# Patient Record
Sex: Male | Born: 1950 | Race: White | Hispanic: No | Smoking: Former smoker
Health system: Southern US, Community
[De-identification: ages and names within clinical notes are randomized; demographics above are authoritative.]

## PROBLEM LIST (undated history)

## (undated) DIAGNOSIS — J449 Chronic obstructive pulmonary disease, unspecified: Secondary | ICD-10-CM

## (undated) DIAGNOSIS — I10 Essential (primary) hypertension: Secondary | ICD-10-CM

---

## 1976-05-04 DIAGNOSIS — F4310 Post-traumatic stress disorder, unspecified: Secondary | ICD-10-CM

## 1976-05-04 HISTORY — DX: Post-traumatic stress disorder, unspecified: F43.10

## 2008-05-01 ENCOUNTER — Emergency Department (HOSPITAL_COMMUNITY): Admission: EM | Admit: 2008-05-01 | Discharge: 2008-05-01 | Payer: Self-pay | Admitting: Family Medicine

## 2010-06-25 ENCOUNTER — Other Ambulatory Visit (HOSPITAL_COMMUNITY): Payer: Self-pay | Admitting: Physical Medicine and Rehabilitation

## 2010-06-25 ENCOUNTER — Ambulatory Visit (HOSPITAL_COMMUNITY)
Admission: RE | Admit: 2010-06-25 | Discharge: 2010-06-25 | Disposition: A | Discharge status: Home / Self Care | Payer: Self-pay | Source: Ambulatory Visit | Attending: Physical Medicine and Rehabilitation | Admitting: Physical Medicine and Rehabilitation

## 2010-06-25 DIAGNOSIS — M545 Low back pain, unspecified: Secondary | ICD-10-CM | POA: Insufficient documentation

## 2010-06-25 DIAGNOSIS — W19XXXA Unspecified fall, initial encounter: Secondary | ICD-10-CM

## 2011-02-06 LAB — POCT I-STAT, CHEM 8
BUN: 9 mg/dL (ref 6–23)
Creatinine, Ser: 1.1 mg/dL (ref 0.4–1.5)
Hemoglobin: 15 g/dL (ref 13.0–17.0)
Potassium: 4.1 mEq/L (ref 3.5–5.1)
Sodium: 145 mEq/L (ref 135–145)

## 2011-02-06 LAB — POCT CARDIAC MARKERS
CKMB, poc: 1.6 ng/mL (ref 1.0–8.0)
Myoglobin, poc: 69.3 ng/mL (ref 12–200)

## 2012-07-10 IMAGING — CR DG LUMBAR SPINE COMPLETE 4+V
5 series · 5 of 5 positions shown · non-contrast
Comparison: None.

CLINICAL DATA: History of injury from previous fall with current
low back pain.

LUMBAR SPINE - COMPLETE 4+ VIEW

[view not recorded (1 of 5)]
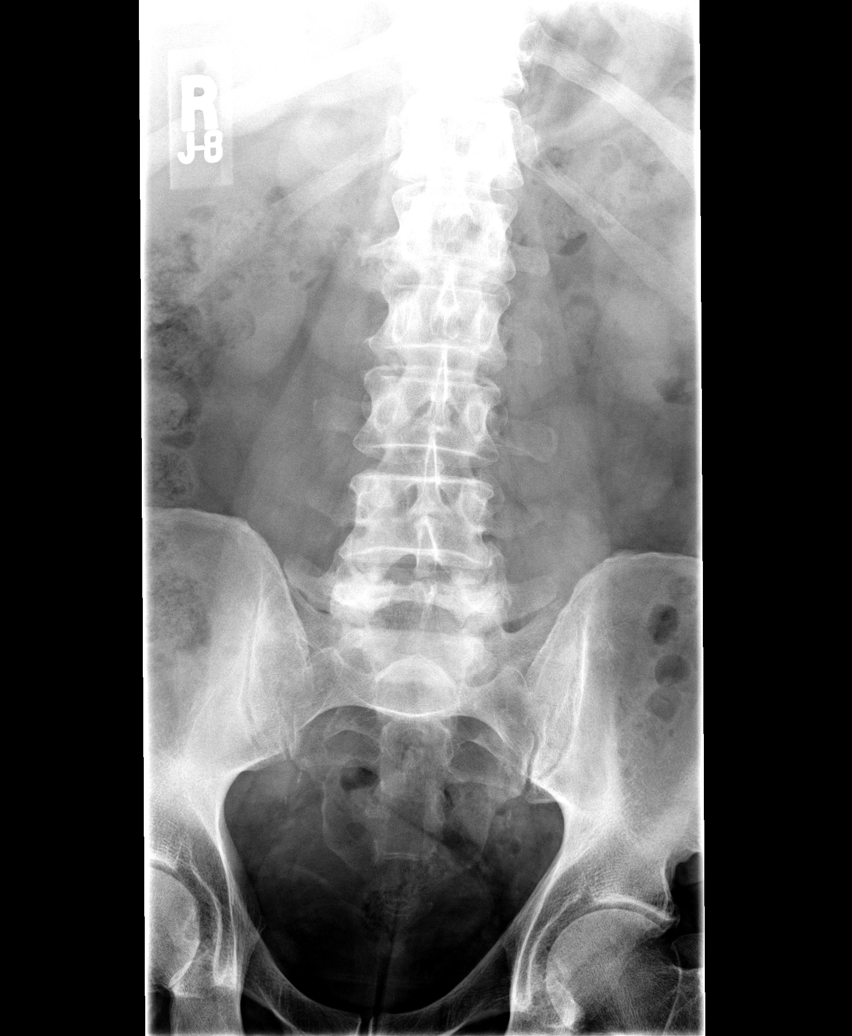

[view not recorded (2 of 5)]
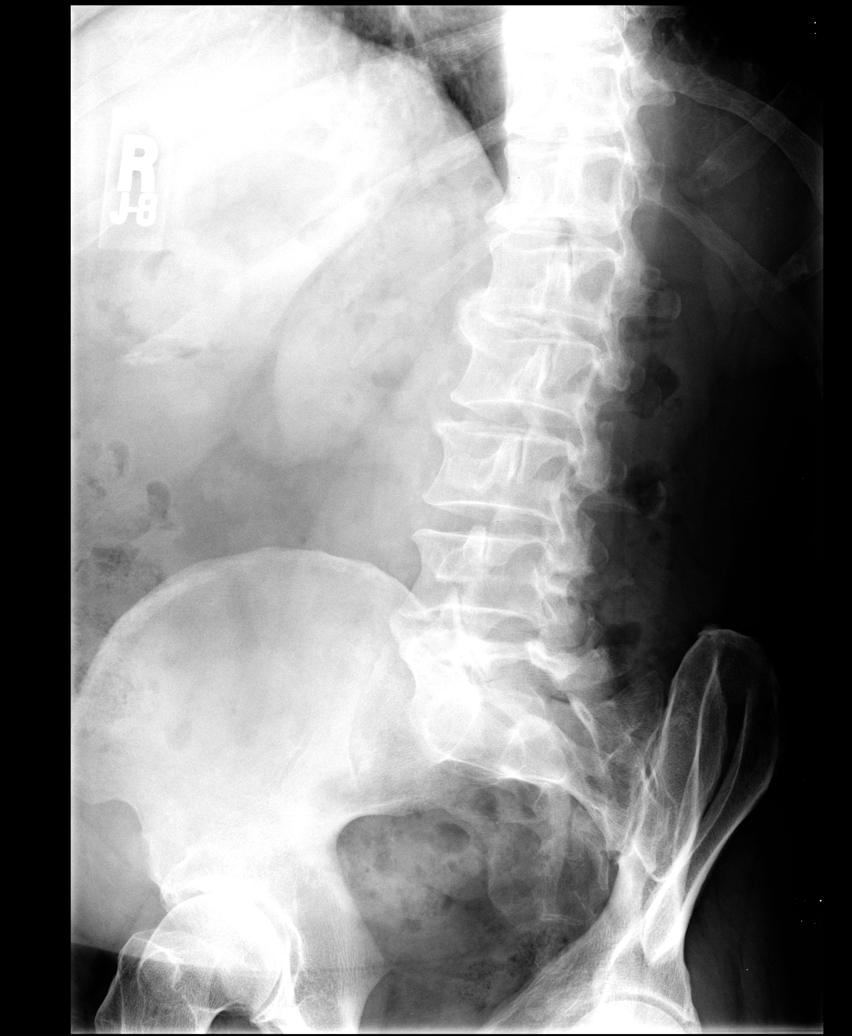

[view not recorded (3 of 5)]
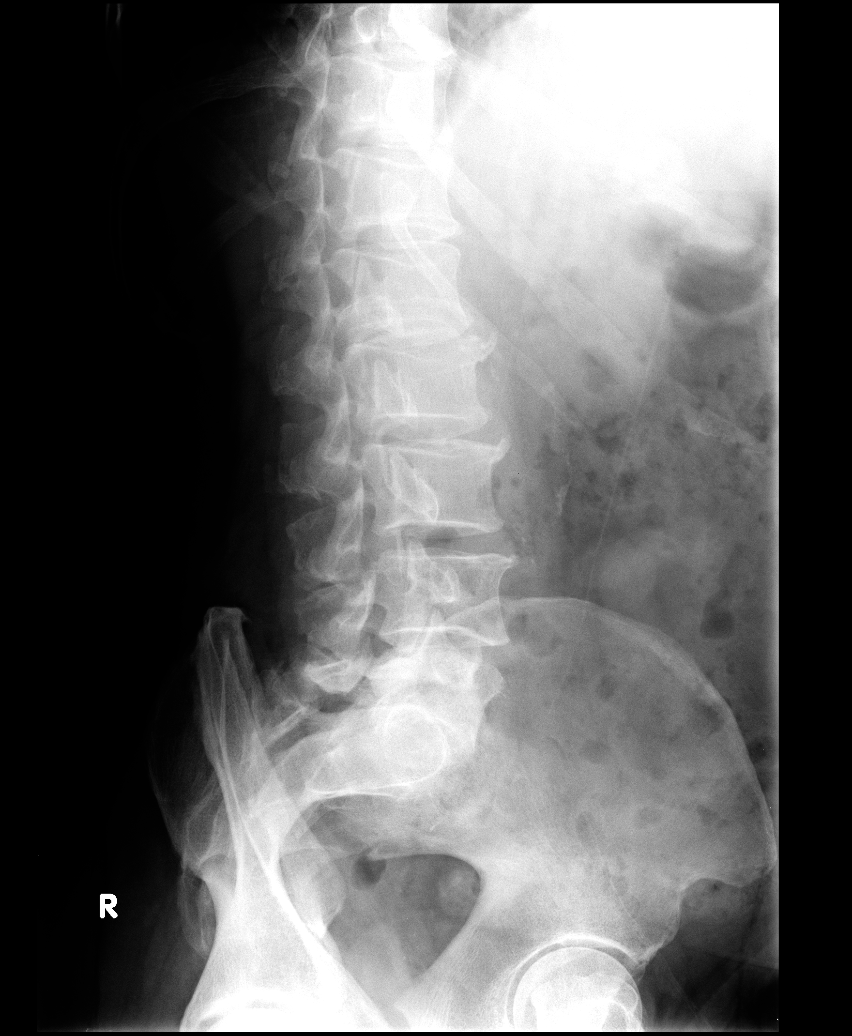

[view not recorded (4 of 5)]
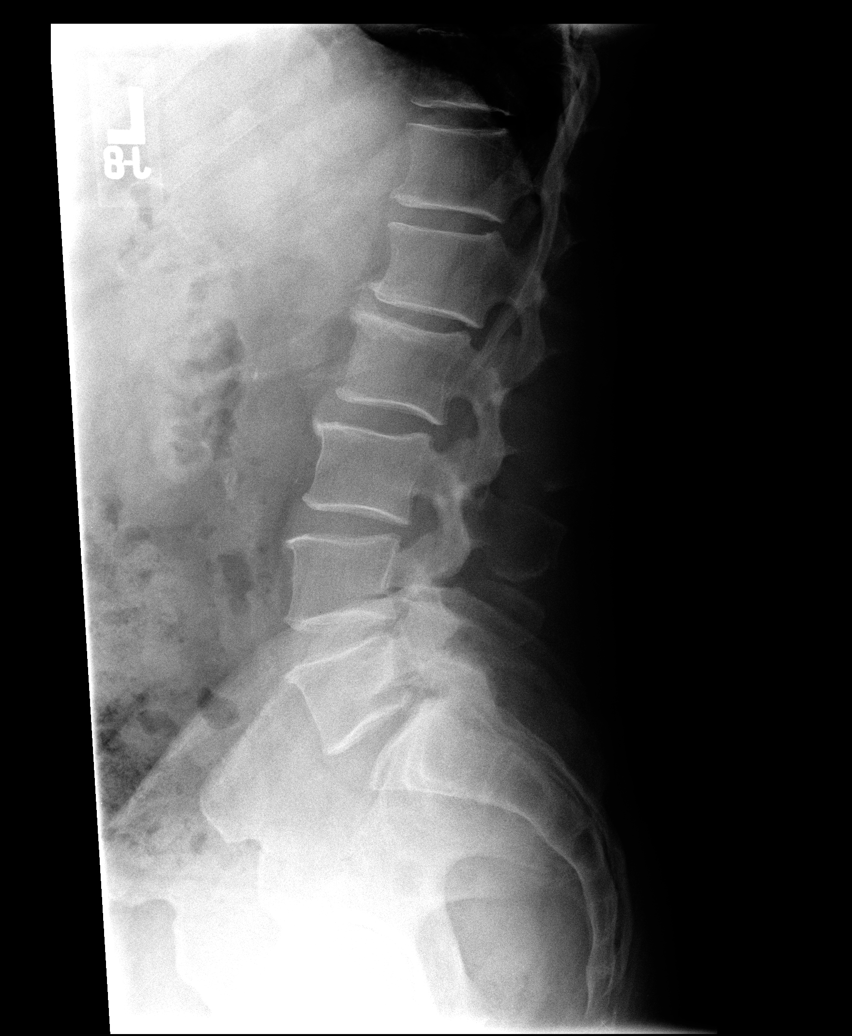

[view not recorded (5 of 5)]
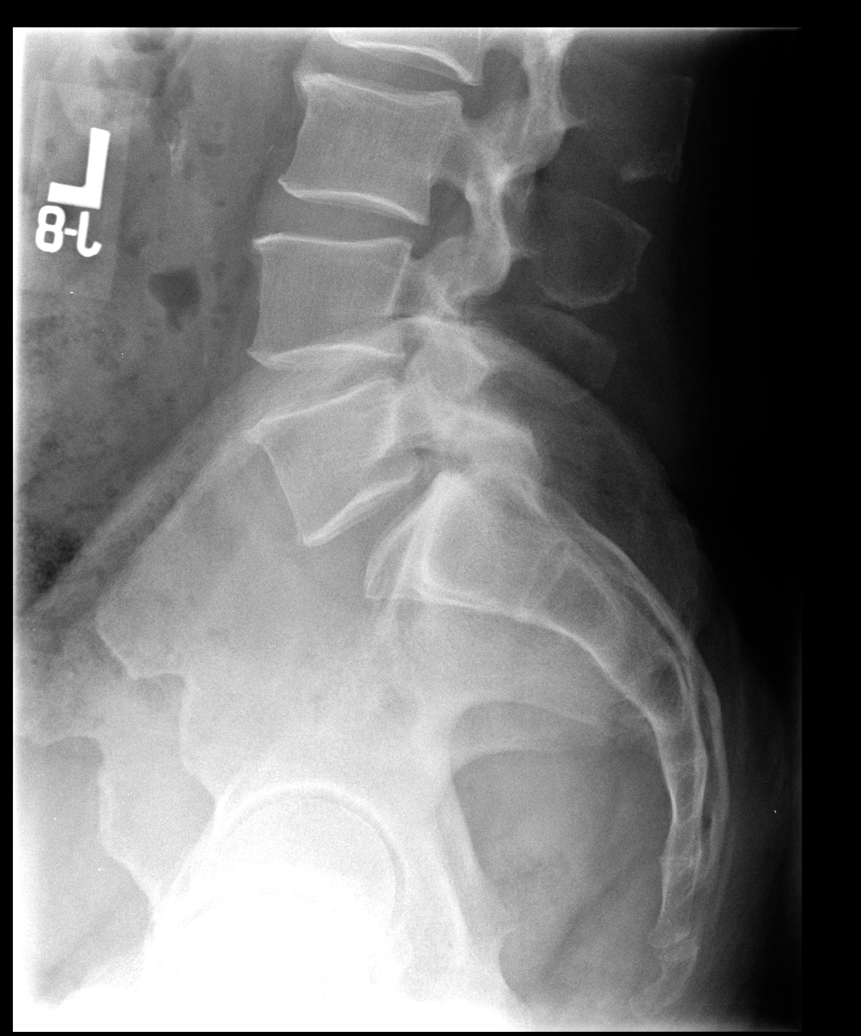

[5 of 5 positions shown; findings below may reference images not displayed]

FINDINGS: SI joints appear intact.  There is marginal osteophyte
formation at multiple levels representing degenerative spondylosis.
There is apophyseal joint degenerative spondylosis at the L5-S1
level.  No fracture or pars defect is seen.  Minimal nonaneurysmal
aortic calcifications are present.  There is narrowing of the
posterior intervertebral disc space at the levels of L4-L5 and L5-
S1.  There is slight posterior subluxation of the body of L4 on the
body of L5.  There is slight anterior subluxation of the body of L5
on the body of S1.  This most likely is chronic representing grade
1 severity pseudospondylolisthesis at these levels.
IMPRESSION: Minimal nonaneurysmal aortic calcification.  Changes of
degenerative disc disease and degenerative spondylosis.  No
fracture or bony destruction.There is slight posterior subluxation
of the body of L4 on the body of L5.  There is slight anterior
subluxation of the body of L5 on the body of S1.  This most likely
is chronic representing grade 1 severity pseudospondylolisthesis at
these levels.

## 2014-01-31 ENCOUNTER — Ambulatory Visit (INDEPENDENT_AMBULATORY_CARE_PROVIDER_SITE_OTHER): Payer: Non-veteran care

## 2014-01-31 ENCOUNTER — Encounter: Payer: Self-pay | Admitting: Podiatry

## 2014-01-31 ENCOUNTER — Ambulatory Visit (INDEPENDENT_AMBULATORY_CARE_PROVIDER_SITE_OTHER): Payer: Non-veteran care | Admitting: Podiatry

## 2014-01-31 VITALS — BP 139/83 | HR 71 | Resp 16 | Ht 67.0 in | Wt 200.0 lb

## 2014-01-31 DIAGNOSIS — M76822 Posterior tibial tendinitis, left leg: Secondary | ICD-10-CM

## 2014-01-31 DIAGNOSIS — M722 Plantar fascial fibromatosis: Secondary | ICD-10-CM

## 2014-01-31 MED ORDER — TRIAMCINOLONE ACETONIDE 10 MG/ML IJ SUSP
10.0000 mg | Freq: Once | INTRAMUSCULAR | Status: AC
  Administered 2014-01-31: 10 mg

## 2014-01-31 NOTE — Patient Instructions (Signed)
Plantar Fasciitis (Heel Spur Syndrome) with Rehab The plantar fascia is a fibrous, ligament-like, soft-tissue structure that spans the bottom of the foot. Plantar fasciitis is a condition that causes pain in the foot due to inflammation of the tissue. SYMPTOMS   Pain and tenderness on the underneath side of the foot.  Pain that worsens with standing or walking. CAUSES  Plantar fasciitis is caused by irritation and injury to the plantar fascia on the underneath side of the foot. Common mechanisms of injury include:  Direct trauma to bottom of the foot.  Damage to a small nerve that runs under the foot where the main fascia attaches to the heel bone.  Stress placed on the plantar fascia due to bone spurs. RISK INCREASES WITH:   Activities that place stress on the plantar fascia (running, jumping, pivoting, or cutting).  Poor strength and flexibility.  Improperly fitted shoes.  Tight calf muscles.  Flat feet.  Failure to warm-up properly before activity.  Obesity. PREVENTION  Warm up and stretch properly before activity.  Allow for adequate recovery between workouts.  Maintain physical fitness:  Strength, flexibility, and endurance.  Cardiovascular fitness.  Maintain a health body weight.  Avoid stress on the plantar fascia.  Wear properly fitted shoes, including arch supports for individuals who have flat feet. PROGNOSIS  If treated properly, then the symptoms of plantar fasciitis usually resolve without surgery. However, occasionally surgery is necessary. RELATED COMPLICATIONS   Recurrent symptoms that may result in a chronic condition.  Problems of the lower back that are caused by compensating for the injury, such as limping.  Pain or weakness of the foot during push-off following surgery.  Chronic inflammation, scarring, and partial or complete fascia tear, occurring more often from repeated injections. TREATMENT  Treatment initially involves the use of  ice and medication to help reduce pain and inflammation. The use of strengthening and stretching exercises may help reduce pain with activity, especially stretches of the Achilles tendon. These exercises may be performed at home or with a therapist. Your caregiver may recommend that you use heel cups of arch supports to help reduce stress on the plantar fascia. Occasionally, corticosteroid injections are given to reduce inflammation. If symptoms persist for greater than 6 months despite non-surgical (conservative), then surgery may be recommended.  MEDICATION   If pain medication is necessary, then nonsteroidal anti-inflammatory medications, such as aspirin and ibuprofen, or other minor pain relievers, such as acetaminophen, are often recommended.  Do not take pain medication within 7 days before surgery.  Prescription pain relievers may be given if deemed necessary by your caregiver. Use only as directed and only as much as you need.  Corticosteroid injections may be given by your caregiver. These injections should be reserved for the most serious cases, because they may only be given a certain number of times. HEAT AND COLD  Cold treatment (icing) relieves pain and reduces inflammation. Cold treatment should be applied for 10 to 15 minutes every 2 to 3 hours for inflammation and pain and immediately after any activity that aggravates your symptoms. Use ice packs or massage the area with a piece of ice (ice massage).  Heat treatment may be used prior to performing the stretching and strengthening activities prescribed by your caregiver, physical therapist, or athletic trainer. Use a heat pack or soak the injury in warm water. SEEK IMMEDIATE MEDICAL CARE IF:  Treatment seems to offer no benefit, or the condition worsens.  Any medications produce adverse side effects. EXERCISES RANGE   OF MOTION (ROM) AND STRETCHING EXERCISES - Plantar Fasciitis (Heel Spur Syndrome) These exercises may help you  when beginning to rehabilitate your injury. Your symptoms may resolve with or without further involvement from your physician, physical therapist or athletic trainer. While completing these exercises, remember:   Restoring tissue flexibility helps normal motion to return to the joints. This allows healthier, less painful movement and activity.  An effective stretch should be held for at least 30 seconds.  A stretch should never be painful. You should only feel a gentle lengthening or release in the stretched tissue. RANGE OF MOTION - Toe Extension, Flexion  Sit with your right / left leg crossed over your opposite knee.  Grasp your toes and gently pull them back toward the top of your foot. You should feel a stretch on the bottom of your toes and/or foot.  Hold this stretch for __________ seconds.  Now, gently pull your toes toward the bottom of your foot. You should feel a stretch on the top of your toes and or foot.  Hold this stretch for __________ seconds. Repeat __________ times. Complete this stretch __________ times per day.  RANGE OF MOTION - Ankle Dorsiflexion, Active Assisted  Remove shoes and sit on a chair that is preferably not on a carpeted surface.  Place right / left foot under knee. Extend your opposite leg for support.  Keeping your heel down, slide your right / left foot back toward the chair until you feel a stretch at your ankle or calf. If you do not feel a stretch, slide your bottom forward to the edge of the chair, while still keeping your heel down.  Hold this stretch for __________ seconds. Repeat __________ times. Complete this stretch __________ times per day.  STRETCH - Gastroc, Standing  Place hands on wall.  Extend right / left leg, keeping the front knee somewhat bent.  Slightly point your toes inward on your back foot.  Keeping your right / left heel on the floor and your knee straight, shift your weight toward the wall, not allowing your back to  arch.  You should feel a gentle stretch in the right / left calf. Hold this position for __________ seconds. Repeat __________ times. Complete this stretch __________ times per day. STRETCH - Soleus, Standing  Place hands on wall.  Extend right / left leg, keeping the other knee somewhat bent.  Slightly point your toes inward on your back foot.  Keep your right / left heel on the floor, bend your back knee, and slightly shift your weight over the back leg so that you feel a gentle stretch deep in your back calf.  Hold this position for __________ seconds. Repeat __________ times. Complete this stretch __________ times per day. STRETCH - Gastrocsoleus, Standing  Note: This exercise can place a lot of stress on your foot and ankle. Please complete this exercise only if specifically instructed by your caregiver.   Place the ball of your right / left foot on a step, keeping your other foot firmly on the same step.  Hold on to the wall or a rail for balance.  Slowly lift your other foot, allowing your body weight to press your heel down over the edge of the step.  You should feel a stretch in your right / left calf.  Hold this position for __________ seconds.  Repeat this exercise with a slight bend in your right / left knee. Repeat __________ times. Complete this stretch __________ times per day.    STRENGTHENING EXERCISES - Plantar Fasciitis (Heel Spur Syndrome)  These exercises may help you when beginning to rehabilitate your injury. They may resolve your symptoms with or without further involvement from your physician, physical therapist or athletic trainer. While completing these exercises, remember:   Muscles can gain both the endurance and the strength needed for everyday activities through controlled exercises.  Complete these exercises as instructed by your physician, physical therapist or athletic trainer. Progress the resistance and repetitions only as guided. STRENGTH -  Towel Curls  Sit in a chair positioned on a non-carpeted surface.  Place your foot on a towel, keeping your heel on the floor.  Pull the towel toward your heel by only curling your toes. Keep your heel on the floor.  If instructed by your physician, physical therapist or athletic trainer, add ____________________ at the end of the towel. Repeat __________ times. Complete this exercise __________ times per day. STRENGTH - Ankle Inversion  Secure one end of a rubber exercise band/tubing to a fixed object (table, pole). Loop the other end around your foot just before your toes.  Place your fists between your knees. This will focus your strengthening at your ankle.  Slowly, pull your big toe up and in, making sure the band/tubing is positioned to resist the entire motion.  Hold this position for __________ seconds.  Have your muscles resist the band/tubing as it slowly pulls your foot back to the starting position. Repeat __________ times. Complete this exercises __________ times per day.  Document Released: 04/20/2005 Document Revised: 07/13/2011 Document Reviewed: 08/02/2008 [employer] Patient Information 2015 [employer]. This information is not intended to replace advice given to you by your health care provider. Make sure you discuss any questions you have with your health care provider.  

## 2014-01-31 NOTE — Progress Notes (Signed)
   Subjective:    Patient ID: Chad Hensley, male    DOB: 09/24/1950, 63 y.o.   MRN: [id number]  HPI Comments: Chad Hensley, 63 year old male, returns the office today with complaints of bilateral heel pain as well as swelling and pain is in his left ankle. Also since he has pain under his toes. States it is ongoing for approximate one year and has been progressive. Pain is worse with standing or ambulation. States that his ankle was previously in the beginning swollen although it has since decreased. Currently doesn't have any discomfort his ankle although after standing for periods of time or after ambulation he has pain onto the inside aspect of his ankle behind the anklebone. Denies any recent history of injury or,. No other complaints at this time. Denies any systemic complaints such as fevers, chills, nausea, vomiting.  Foot Pain Associated symptoms include fatigue and headaches.      Review of Systems  Constitutional: Positive for fatigue.  HENT: Positive for sinus pressure.        Ringing in ears  Eyes: Positive for itching.  Endocrine: Positive for heat intolerance.  Musculoskeletal:       Joint pain Difficulty walking Muscle pain  Allergic/Immunologic: Positive for environmental allergies.  Neurological: Positive for headaches.  All other systems reviewed and are negative.      Objective:   Physical Exam AAO x3, NAD DP/PT pulses palpable 2/4 b/l, CRT < 3 sec Protective sensation intact with Simms Weinstein monofilament, vibratory sensation intact, Achilles tendon reflex intact. Currently no swelling to the left ankle and no tenderness to palpation and no pinpoint bony tenderness or pain the vibratory sensation. Upon palpation of the posterior tibial tendon on the course on the posterior aspect of the medial malleolus as well as inferior and onto the insertion patient states that subjectively this is where he has pain. Tenderness to palpation the plantar medial tubercle of the  calcaneus bilaterally at the insertion of the plantar fascia. No pain with lateral compression of the calcaneus or along the posterior aspect. Plantar fascia appears to be intact and no pain within the arch of the foot. Decrease in medial arch height upon weightbearing. Able to perform single and double heel rise without complications. Splaying of the second and third digits bilaterally. No pain upon compression of the second interspace or with lateral compression to suggest a neuroma. ROM WNL, MMT 5/5. No open skin lesions. No calf pain, swelling, warmth.       Assessment & Plan:  63 year old male bilateral plantar fasciitis, likely posterior tibial tendinitis. -X-rays were obtained and reviewed the patient. See x-ray report for full details. -Conservative versus surgical treatment were discussed including alternatives, risks, complications. -Patient elects to proceed with steroid injection into the bilateral heels. Under sterile skin preparation, a total of 2.5cc of kenalog 10, 0.5% Marcaine plain, and 2% lidocaine plain were infiltrated into the symptomatic area without complication. A band-aid was applied. Patient tolerated the injection well without complication.  -Recommended an ankle brace to help support his left ankle due to likely posterior tibial tendinitis. -Discussed stretching exercises. -Ice to the effected area. -Discussed supportive shoe gear and possible inserts. [facility]. Discussed with him to get orthotics to them and are covered. -Followup in 3 weeks or sooner if any palms are to arise or any change in symptoms. In the meantime call any questions, concerns.

## 2014-02-21 ENCOUNTER — Ambulatory Visit (INDEPENDENT_AMBULATORY_CARE_PROVIDER_SITE_OTHER): Payer: Non-veteran care | Admitting: Podiatry

## 2014-02-21 ENCOUNTER — Encounter: Payer: Self-pay | Admitting: Podiatry

## 2014-02-21 VITALS — BP 142/85 | HR 71 | Resp 14

## 2014-02-21 DIAGNOSIS — M76822 Posterior tibial tendinitis, left leg: Secondary | ICD-10-CM | POA: Diagnosis not present

## 2014-02-21 DIAGNOSIS — M722 Plantar fascial fibromatosis: Secondary | ICD-10-CM | POA: Diagnosis not present

## 2014-02-25 NOTE — Progress Notes (Signed)
Patient ID: Chad Hensley, male   DOB: 09-Nov-1950, 63 y.o.   MRN: [id number]  Subjective: Chad Hensley, 63 year old male, returns after safe for follow-up evaluation for bilateral plantar fasciitis and left posterior tibial tendinitis. He states that he no longer has any pain along the inside aspect of his ankle. He was wearing the brace however he has discontinued it since his pain is decreased. Also states that since receiving the injections into his bilateral heels he's had significant improvement in the pain. He continues to have mild discomfort into the plantar aspect of the heels although is more intermittent and decreased in nature. Denies any acute changes since last appointment. He denies any systemic complaints as fevers, chills, nausea, vomiting.  Objective: AAO x3, NAD DP/PT pulses palpable bilaterally, CRT less than 3 seconds Protective sensation intact with Simms Weinstein monofilament, vibratory sensation intact, Achilles tendon reflex intact Mild tenderness over the plantar medial aspect of the calcaneus at the insertion of the plantar fascia. There is no pain along the course of the plantar fascia within the arch of the foot. There is no pain with lateral compression of the calcaneus or with vibratory sensation. No pain along the posterior aspect of the calcaneus. No pain along the course of the posterior tibial tendon or the insertion onto the navicular. No pain with range of motion of the ankle or subtalar joint. Range of motion WNL, MMT 5/5.  No calf pain with compression, swelling, warmth, erythema No open lesions.  Assessment: 63 year old male with bilateral plantar fasciitis, resolved posterior tibial tendinitis.  Plan: -Treatment options discussed including alternatives, risks, complications. -For the plantar fasciitis recommended continuing stretching exercises as well as ice to the affected area. [facility] and he is requesting a note for  custom inserts. This was provided for him today. -For posterior tibial tendinitis this appears to be resolved at this time. Continue brace as needed. -Follow-up as needed. Call the office with any questions, concerns, change in symptoms.

## 2018-03-16 ENCOUNTER — Ambulatory Visit: Payer: No Typology Code available for payment source | Attending: Orthopedic Surgery | Admitting: Physical Therapy

## 2018-03-16 ENCOUNTER — Other Ambulatory Visit: Payer: Self-pay

## 2018-03-16 DIAGNOSIS — M25612 Stiffness of left shoulder, not elsewhere classified: Secondary | ICD-10-CM | POA: Diagnosis present

## 2018-03-16 DIAGNOSIS — G8929 Other chronic pain: Secondary | ICD-10-CM | POA: Diagnosis present

## 2018-03-16 DIAGNOSIS — M25512 Pain in left shoulder: Secondary | ICD-10-CM | POA: Diagnosis not present

## 2018-03-16 NOTE — Therapy (Signed)
[facility] [address] [place], [place], [address] Phone: [phone]   Fax:  [phone]  Physical Therapy Evaluation  Patient Details  Name: Chad Hensley MRN: [id number] Date of Birth: 1950-12-01 Referring Provider (PT): Alexandrew Julien GirtPerkins PA-C.   Encounter Date: 03/16/2018    No past medical history on file.                  Objective measurements completed on examination: See above findings.                PT Short Term Goals - 03/16/18 1400      PT SHORT TERM GOAL #1   Title  STG's=LTG's.        PT Long Term Goals - 03/16/18 1400      PT LONG TERM GOAL #1   Title  Independent with a HEP.    Time  8    Period  Weeks    Status  New      PT LONG TERM GOAL #2   Title  Active left shoulder flexion to 145 degrees so the patient can easily reach overhead.    Time  8    Period  Weeks    Status  New      PT LONG TERM GOAL #3   Title  Active ER to 70 degrees+ to allow for easily donning/doffing of apparel.    Time  8    Period  Weeks    Status  New      PT LONG TERM GOAL #4   Title  Increase ROM so patient is able to reach behind back to L3.    Time  6    Period  Weeks    Status  New      PT LONG TERM GOAL #5   Title  Increase left shoulder strength to a solid 4+/5 to increase stability for performance of functional activities.    Time  8    Period  Weeks    Status  New      PT LONG TERM GOAL #6   Title  Perform ADL's with pain not > 2-3/10.    Time  6    Period  Weeks    Status  New               Patient will benefit from skilled therapeutic intervention in order to improve the following deficits and impairments:  Pain, Decreased activity tolerance, Decreased range of motion  Visit Diagnosis: Chronic left shoulder pain - Plan: PT plan of care cert/re-cert  Stiffness of left shoulder, not elsewhere classified - Plan: PT plan of care cert/re-cert     Problem  List There are no active problems to display for this patient.   APPLEGATE, [place] MPT 03/18/2018, 12:59 PM  [facility] [address] [place], [place], [address] Phone: [phone]   Fax:  [phone]  Name: Chad Hensley MRN: [id number] Date of Birth: 1950-12-01

## 2018-03-18 ENCOUNTER — Encounter: Payer: Self-pay | Admitting: Physical Therapy

## 2018-03-21 ENCOUNTER — Encounter: Payer: Self-pay | Admitting: Physical Therapy

## 2018-03-21 ENCOUNTER — Ambulatory Visit: Payer: No Typology Code available for payment source | Admitting: Physical Therapy

## 2018-03-21 DIAGNOSIS — M25512 Pain in left shoulder: Secondary | ICD-10-CM | POA: Diagnosis not present

## 2018-03-21 DIAGNOSIS — M25612 Stiffness of left shoulder, not elsewhere classified: Secondary | ICD-10-CM

## 2018-03-21 DIAGNOSIS — G8929 Other chronic pain: Secondary | ICD-10-CM

## 2018-03-21 NOTE — Therapy (Signed)
[facility] [address] [place], [place], [address] Phone: [phone]   Fax:  [phone]  Physical Therapy Treatment  Patient Details  Name: Chad MeansFred E Dimitroff MRN: [id number] Date of Birth: [birth year] Referring Provider (PT): Alexandrew Julien GirtPerkins PA-C.   Encounter Date: 03/21/2018  PT End of Session - 03/21/18 1048    Visit Number  2    Number of Visits  15    Date for PT Re-Evaluation  05/11/18    PT Start Time  0945    PT Stop Time  1043    PT Time Calculation (min)  58 min       History reviewed. No pertinent past medical history.  History reviewed. No pertinent surgical history.  There were no vitals filed for this visit.  Subjective Assessment - 03/21/18 1054    Subjective  Been doing the home exercises.    Patient Stated Goals  Left left arm without pain.    Currently in Pain?  Yes    Pain Score  5     Pain Location  Shoulder    Pain Orientation  Left    Pain Descriptors / Indicators  Aching;Throbbing    Pain Type  Surgical pain    Pain Onset  More than a month ago                       [facility]/Exercise - 03/21/18 0001      Modalities   Modalities  Vasopneumatic      Vasopneumatic   Number Minutes Vasopneumatic   20 minutes    Vasopnuematic Location   --   Left shoulder.   Vasopneumatic Pressure  Low      Manual Therapy   Manual Therapy  Passive ROM    Passive ROM  In supine:  PROM x 26 minutes to patient's left shoulder into flexion and ER.               PT Short Term Goals - 03/16/18 1400      PT SHORT TERM GOAL #1   Title  STG's=LTG's.        PT Long Term Goals - 03/16/18 1400      PT LONG TERM GOAL #1   Title  Independent with a HEP.    Time  8    Period  Weeks    Status  New      PT LONG TERM GOAL #2   Title  Active left shoulder flexion to 145 degrees so the patient can easily reach overhead.    Time  8    Period  Weeks    Status  New      PT  LONG TERM GOAL #3   Title  Active ER to 70 degrees+ to allow for easily donning/doffing of apparel.    Time  8    Period  Weeks    Status  New      PT LONG TERM GOAL #4   Title  Increase ROM so patient is able to reach behind back to L3.    Time  6    Period  Weeks    Status  New      PT LONG TERM GOAL #5   Title  Increase left shoulder strength to a solid 4+/5 to increase stability for performance of functional activities.    Time  8    Period  Weeks    Status  New  PT LONG TERM GOAL #6   Title  Perform ADL's with pain not > 2-3/10.    Time  6    Period  Weeks    Status  New            Plan - 03/21/18 1056    Clinical Impression Statement  The patient has been very compliant to his HEP.  Hisleft shoulder passive range of motion has improved dramatically.    PT Treatment/Interventions  ADLs/Self Care Home Management;Cryotherapy;Electrical Stimulation;Ultrasound;Therapeutic activities;Therapeutic exercise;Patient/family education;Manual techniques;Vasopneumatic Device    PT Next Visit Plan  Begin with left shoulder PROM.  Please review HEP.  Per referral can begin AAROM.  Non-motoric electrical stimulation and low vasopneumatic with pillow between left elbow and thorax.    Consulted and Agree with Plan of Care  Patient       Patient will benefit from skilled therapeutic intervention in order to improve the following deficits and impairments:  Pain, Decreased activity tolerance, Decreased range of motion  Visit Diagnosis: Chronic left shoulder pain  Stiffness of left shoulder, not elsewhere classified     Problem List There are no active problems to display for this patient.   Shron Ozer, [place] MPT 03/21/2018, 10:58 AM  [facility] [address] [place], [place], [address] Phone: [phone]   Fax:  [phone]  Name: CHINONSO LINKER MRN: [id number] Date of Birth: [birth year]

## 2018-03-24 ENCOUNTER — Ambulatory Visit: Payer: No Typology Code available for payment source | Admitting: Physical Therapy

## 2018-03-24 ENCOUNTER — Encounter: Payer: Self-pay | Admitting: Physical Therapy

## 2018-03-24 DIAGNOSIS — G8929 Other chronic pain: Secondary | ICD-10-CM

## 2018-03-24 DIAGNOSIS — M25612 Stiffness of left shoulder, not elsewhere classified: Secondary | ICD-10-CM

## 2018-03-24 DIAGNOSIS — M25512 Pain in left shoulder: Principal | ICD-10-CM

## 2018-03-24 NOTE — Therapy (Signed)
[facility] [address] [place], [place], [address] Phone: [phone]   Fax:  [phone]  Physical Therapy Treatment  Patient Details  Name: Chad Hensley MRN: [id number] Date of Birth: November 03, 1950 Referring Provider (PT): Alexandrew Julien Girt PA-C.   Encounter Date: 03/24/2018  PT End of Session - 03/24/18 0943    Visit Number  3    Number of Visits  15    Date for PT Re-Evaluation  05/11/18    Authorization Type  progress note every 10th visit    PT Start Time  0900    PT Stop Time  0951    PT Time Calculation (min)  51 min    Activity Tolerance  Patient tolerated treatment well    Behavior During Therapy  [facility] for tasks assessed/performed       History reviewed. No pertinent past medical history.  History reviewed. No pertinent surgical history.  There were no vitals filed for this visit.  Subjective Assessment - 03/24/18 0936    Subjective  Patient reports feeling a little sore in the UT from last visit but reports no pain at rest.    Pertinent History  HTN, COPD.    Patient Stated Goals  Left left arm without pain.    Currently in Pain?  No/denies         [facility] PT Assessment - 03/24/18 0001      Assessment   Medical Diagnosis  Left shoulder RCR.    Referring Provider (PT)  Alexandrew Julien Girt PA-C.      Precautions   Precaution Comments  Begin with left shoulder PROM.  Can start AAROM per referral.                   [facility] Adult PT Treatment/Exercise - 03/24/18 0001      Modalities   Modalities  Vasopneumatic      Vasopneumatic   Number Minutes Vasopneumatic   15 minutes    Vasopnuematic Location   Shoulder    Vasopneumatic Pressure  Low    Vasopneumatic Temperature   51      Manual Therapy   Manual Therapy  Passive ROM    Passive ROM  In supine:  PROM to left shoulder into flexion and ER to improve ROM               PT Short Term Goals - 03/16/18 1400      PT SHORT TERM GOAL #1   Title  STG's=LTG's.        PT Long Term Goals - 03/16/18 1400      PT LONG TERM GOAL #1   Title  Independent with a HEP.    Time  8    Period  Weeks    Status  New      PT LONG TERM GOAL #2   Title  Active left shoulder flexion to 145 degrees so the patient can easily reach overhead.    Time  8    Period  Weeks    Status  New      PT LONG TERM GOAL #3   Title  Active ER to 70 degrees+ to allow for easily donning/doffing of apparel.    Time  8    Period  Weeks    Status  New      PT LONG TERM GOAL #4   Title  Increase ROM so patient is able to reach behind back to L3.    Time  6  Period  Weeks    Status  New      PT LONG TERM GOAL #5   Title  Increase left shoulder strength to a solid 4+/5 to increase stability for performance of functional activities.    Time  8    Period  Weeks    Status  New      PT LONG TERM GOAL #6   Title  Perform ADL's with pain not > 2-3/10.    Time  6    Period  Weeks    Status  New            Plan - 03/24/18 16100952    Clinical Impression Statement  Patient was able to tolerate treatment well. Patient noted with intermittent muscle guarding but noted with improvements after oscillations to promote muscle relaxation. Patient instructed to continue exercises at home to improve ROM. Patient reported understanding. Normal response to modalities upon removal.      Clinical Presentation  Stable    Clinical Decision Making  Low    Rehab Potential  Good    PT Frequency  --   15 approved visits   PT Treatment/Interventions  ADLs/Self Care Home Management;Cryotherapy;Electrical Stimulation;Ultrasound;Therapeutic activities;Therapeutic exercise;Patient/family education;Manual techniques;Vasopneumatic Device    PT Next Visit Plan  Begin with left shoulder PROM.  Please review HEP.  Per referral can begin AAROM.  Non-motoric electrical stimulation and low vasopneumatic with pillow between left elbow and thorax.    Consulted and Agree with Plan of  Care  Patient       Patient will benefit from skilled therapeutic intervention in order to improve the following deficits and impairments:  Pain, Decreased activity tolerance, Decreased range of motion  Visit Diagnosis: Chronic left shoulder pain  Stiffness of left shoulder, not elsewhere classified     Problem List There are no active problems to display for this patient.  Guss BundeKrystle Tsuneo Faison, PT, DPT 03/24/2018, 9:57 AM  [facility] [address] [place], [place], [address] Phone: [phone]   Fax:  [phone]  Name: Chad Hensley MRN: [id number] Date of Birth: [birth year]

## 2018-03-28 ENCOUNTER — Ambulatory Visit: Payer: No Typology Code available for payment source | Admitting: Physical Therapy

## 2018-03-28 DIAGNOSIS — G8929 Other chronic pain: Secondary | ICD-10-CM

## 2018-03-28 DIAGNOSIS — M25512 Pain in left shoulder: Secondary | ICD-10-CM | POA: Diagnosis not present

## 2018-03-28 DIAGNOSIS — M25612 Stiffness of left shoulder, not elsewhere classified: Secondary | ICD-10-CM

## 2018-03-28 NOTE — Therapy (Signed)
[facility] [address] [place], [place], [address] Phone: [phone]   Fax:  [phone]  Physical Therapy Treatment  Patient Details  Name: Chad Hensley MRN: [id number] Date of Birth: [birth year] Referring Provider (PT): Alexandrew Julien GirtPerkins PA-C.   Encounter Date: 03/28/2018  PT End of Session - 03/28/18 0816    Visit Number  4    Number of Visits  15    Date for PT Re-Evaluation  05/11/18    Authorization Type  progress note every 10th visit    PT Start Time  0815    PT Stop Time  0903    PT Time Calculation (min)  48 min    Activity Tolerance  Patient tolerated treatment well    Behavior During Therapy  [facility] for tasks assessed/performed       No past medical history on file.  No past surgical history on file.  There were no vitals filed for this visit.  Subjective Assessment - 03/28/18 0817    Subjective  Patient reports no new complaints    Pertinent History  HTN, COPD.    Patient Stated Goals  Left left arm without pain.    Currently in Pain?  No/denies         [facility] - 03/28/18 0001      Assessment   Medical Diagnosis  Left shoulder RCR.    Referring Provider (PT)  Alexandrew Julien GirtPerkins PA-C.    Onset Date/Surgical Date  01/26/18    Next MD Visit  April 19, 2018      Precautions   Precaution Comments  Begin with left shoulder PROM.  Can start AAROM per referral.                   [facility] Adult PT Treatment/Exercise - 03/28/18 0001      Modalities   Modalities  Vasopneumatic      Vasopneumatic   Number Minutes Vasopneumatic   15 minutes    Vasopnuematic Location   Shoulder    Vasopneumatic Pressure  Low    Vasopneumatic Temperature   49      Manual Therapy   Manual Therapy  Passive ROM    Passive ROM  In supine:  PROM to left shoulder into flexion and ER to improve ROM             PT Education - 03/28/18 0852    Education Details  AAROM supine flexion, and protraction     Person(s) Educated  Patient    Methods  Explanation;Demonstration;Handout    Comprehension  Verbalized understanding;Returned demonstration       PT Short Term Goals - 03/16/18 1400      PT SHORT TERM GOAL #1   Title  STG's=LTG's.        PT Long Term Goals - 03/16/18 1400      PT LONG TERM GOAL #1   Title  Independent with a HEP.    Time  8    Period  Weeks    Status  New      PT LONG TERM GOAL #2   Title  Active left shoulder flexion to 145 degrees so the patient can easily reach overhead.    Time  8    Period  Weeks    Status  New      PT LONG TERM GOAL #3   Title  Active ER to 70 degrees+ to allow for easily donning/doffing of apparel.    Time  8    Period  Weeks    Status  New      PT LONG TERM GOAL #4   Title  Increase ROM so patient is able to reach behind back to L3.    Time  6    Period  Weeks    Status  New      PT LONG TERM GOAL #5   Title  Increase left shoulder strength to a solid 4+/5 to increase stability for performance of functional activities.    Time  8    Period  Weeks    Status  New      PT LONG TERM GOAL #6   Title  Perform ADL's with pain not > 2-3/10.    Time  6    Period  Weeks    Status  New            Plan - 03/28/18 1610    Clinical Impression Statement  Patient was able to tolerate treatment well. Patient required intermittnet oscillations to decrease muscle guarding and promote muscle relaxation. Patient was provided with supine AAROM flexion and protaction for HEP. Patient demonstrated good form after explanation. Normal response to modalities upon removal. Patient was able to demonstrate independent donning of jacket and patient reported donning/doffing jacket is getting easier.     Clinical Presentation  Stable    Clinical Decision Making  Low    Rehab Potential  Good    PT Frequency  --   15 approved visits   PT Duration  2 weeks    PT Treatment/Interventions  ADLs/Self Care Home Management;Cryotherapy;Electrical  Stimulation;Ultrasound;Therapeutic activities;Therapeutic exercise;Patient/family education;Manual techniques;Vasopneumatic Device    PT Next Visit Plan  Begin with left shoulder PROM.  Please review HEP.  Per referral can begin AAROM.  Non-motoric electrical stimulation and low vasopneumatic with pillow between left elbow and thorax.    Consulted and Agree with Plan of Care  Patient       Patient will benefit from skilled therapeutic intervention in order to improve the following deficits and impairments:  Pain, Decreased activity tolerance, Decreased range of motion  Visit Diagnosis: Chronic left shoulder pain  Stiffness of left shoulder, not elsewhere classified     Problem List There are no active problems to display for this patient.   Guss Bunde, PT, DPT 03/28/2018, 9:08 AM  [facility] [address] [place], [place], [address] Phone: [phone]   Fax:  [phone]  Name: Chad Hensley MRN: [id number] Date of Birth: 1950-06-07

## 2018-03-30 ENCOUNTER — Ambulatory Visit: Payer: No Typology Code available for payment source | Admitting: Physical Therapy

## 2018-03-30 DIAGNOSIS — M25512 Pain in left shoulder: Secondary | ICD-10-CM | POA: Diagnosis not present

## 2018-03-30 DIAGNOSIS — M25612 Stiffness of left shoulder, not elsewhere classified: Secondary | ICD-10-CM

## 2018-03-30 DIAGNOSIS — G8929 Other chronic pain: Secondary | ICD-10-CM

## 2018-03-30 NOTE — Therapy (Signed)
[facility] [address] [place], [place], [address] Phone: [phone]   Fax:  [phone]  Physical Therapy Treatment  Patient Details  Name: Chad Hensley MRN: [id number] Date of Birth: November 09, 1950 Referring Provider (PT): Alexandrew Julien Girt PA-C.   Encounter Date: 03/30/2018  PT End of Session - 03/30/18 0900    Visit Number  5    Number of Visits  15    Date for PT Re-Evaluation  05/11/18    Authorization Type  progress note every 10th visit    PT Start Time  0828   late arrival   PT Stop Time  0915    PT Time Calculation (min)  47 min    Activity Tolerance  Patient tolerated treatment well    Behavior During Therapy  [facility] for tasks assessed/performed       No past medical history on file.  No past surgical history on file.  There were no vitals filed for this visit.      [facility] PT Assessment - 03/30/18 0001      Assessment   Medical Diagnosis  Left shoulder RCR.    Referring Provider (PT)  Alexandrew Julien Girt PA-C.    Onset Date/Surgical Date  01/26/18    Next MD Visit  April 19, 2018      Precautions   Precaution Comments  Begin with left shoulder PROM.  Can start AAROM per referral.                   [facility] Adult PT Treatment/Exercise - 03/30/18 0001      Modalities   Modalities  Vasopneumatic      Vasopneumatic   Number Minutes Vasopneumatic   15 minutes    Vasopnuematic Location   Shoulder    Vasopneumatic Pressure  Low    Vasopneumatic Temperature   50      Manual Therapy   Manual Therapy  Passive ROM    Passive ROM  In supine:  PROM to left shoulder into flexion and ER to improve ROM               PT Short Term Goals - 03/16/18 1400      PT SHORT TERM GOAL #1   Title  STG's=LTG's.        PT Long Term Goals - 03/16/18 1400      PT LONG TERM GOAL #1   Title  Independent with a HEP.    Time  8    Period  Weeks    Status  New      PT LONG TERM GOAL #2   Title   Active left shoulder flexion to 145 degrees so the patient can easily reach overhead.    Time  8    Period  Weeks    Status  New      PT LONG TERM GOAL #3   Title  Active ER to 70 degrees+ to allow for easily donning/doffing of apparel.    Time  8    Period  Weeks    Status  New      PT LONG TERM GOAL #4   Title  Increase ROM so patient is able to reach behind back to L3.    Time  6    Period  Weeks    Status  New      PT LONG TERM GOAL #5   Title  Increase left shoulder strength to a solid 4+/5 to increase stability for performance  of functional activities.    Time  8    Period  Weeks    Status  New      PT LONG TERM GOAL #6   Title  Perform ADL's with pain not > 2-3/10.    Time  6    Period  Weeks    Status  New            Plan - 03/30/18 0900    Clinical Impression Statement  Patient was able to tolerate treatment well despite increased soreness in left shoulder. Patient educated finding balance between doing enough exercises and allowing enough rest for healing. Patient reports understanding. Normal response to modalities at end of session    Clinical Presentation  Stable    Clinical Decision Making  Low    Rehab Potential  Good    PT Duration  2 weeks    PT Treatment/Interventions  ADLs/Self Care Home Management;Cryotherapy;Electrical Stimulation;Ultrasound;Therapeutic activities;Therapeutic exercise;Patient/family education;Manual techniques;Vasopneumatic Device    PT Next Visit Plan  Begin with left shoulder PROM.  Please review HEP.  Per referral can begin AAROM.  Non-motoric electrical stimulation and low vasopneumatic with pillow between left elbow and thorax.    Consulted and Agree with Plan of Care  Patient       Patient will benefit from skilled therapeutic intervention in order to improve the following deficits and impairments:  Pain, Decreased activity tolerance, Decreased range of motion  Visit Diagnosis: Chronic left shoulder pain  Stiffness of  left shoulder, not elsewhere classified     Problem List There are no active problems to display for this patient.  Guss BundeKrystle Auden Wettstein, PT, DPT 03/30/2018, 9:17 AM  [facility] [address] [place], [place], [address] Phone: [phone]   Fax:  [phone]  Name: Chad Hensley MRN: [id number] Date of Birth: 02-Apr-1951

## 2018-04-04 ENCOUNTER — Ambulatory Visit: Payer: No Typology Code available for payment source | Attending: Orthopedic Surgery | Admitting: Physical Therapy

## 2018-04-04 DIAGNOSIS — M25612 Stiffness of left shoulder, not elsewhere classified: Secondary | ICD-10-CM | POA: Insufficient documentation

## 2018-04-04 DIAGNOSIS — M25512 Pain in left shoulder: Secondary | ICD-10-CM | POA: Insufficient documentation

## 2018-04-04 DIAGNOSIS — G8929 Other chronic pain: Secondary | ICD-10-CM | POA: Diagnosis present

## 2018-04-04 NOTE — Therapy (Signed)
[facility] [address] [place], [place], [address] Phone: [phone]   Fax:  [phone]  Physical Therapy Treatment  Patient Details  Name: Chad Hensley MRN: [id number] Date of Birth: [birth year] Referring Provider (PT): Alexandrew Julien GirtPerkins PA-C.   Encounter Date: 04/04/2018  PT End of Session - 04/04/18 0955    Visit Number  6    Number of Visits  15    Date for PT Re-Evaluation  05/11/18    Authorization Type  progress note every 10th visit    PT Start Time  0900    PT Stop Time  0951    PT Time Calculation (min)  51 min    Activity Tolerance  Patient tolerated treatment well    Behavior During Therapy  [facility] for tasks assessed/performed       No past medical history on file.  No past surgical history on file.  There were no vitals filed for this visit.  Subjective Assessment - 04/04/18 0911    Subjective  Doing good.    Pertinent History  HTN, COPD.    Patient Stated Goals  Left left arm without pain.    Pain Score  5     Pain Orientation  Left    Pain Descriptors / Indicators  Aching;Throbbing    Pain Onset  More than a month ago                       [facility] Treatment/Exercise - 04/04/18 0001      Exercises   Exercises  Shoulder      Shoulder Exercises: Pulleys   Flexion Limitations  6 minutes.    Other Pulley Exercises  UE ranger into flexion x 3 minutes f/b wall ladder x 3minutes.      Programme researcher, broadcasting/film/videolectrical Stimulation   Electrical Stimulation Location  Left shoulder.    Electrical Stimulation Action  IFC x 80-150 Hz x 20 minutes.    Electrical Stimulation Goals  Pain      Vasopneumatic   Number Minutes Vasopneumatic   20 minutes    Vasopnuematic Location   --   Left shoulder.   Vasopneumatic Pressure  Low      Manual Therapy   Manual Therapy  Passive ROM    Passive ROM  In supine:  PROM into left shoulder flexion and ER x 5 minutes.      UBE x 8 minutes at 120 RPM's.         PT  Short Term Goals - 03/16/18 1400      PT SHORT TERM GOAL #1   Title  STG's=LTG's.        PT Long Term Goals - 03/16/18 1400      PT LONG TERM GOAL #1   Title  Independent with a HEP.    Time  8    Period  Weeks    Status  New      PT LONG TERM GOAL #2   Title  Active left shoulder flexion to 145 degrees so the patient can easily reach overhead.    Time  8    Period  Weeks    Status  New      PT LONG TERM GOAL #3   Title  Active ER to 70 degrees+ to allow for easily donning/doffing of apparel.    Time  8    Period  Weeks    Status  New      PT  LONG TERM GOAL #4   Title  Increase ROM so patient is able to reach behind back to L3.    Time  6    Period  Weeks    Status  New      PT LONG TERM GOAL #5   Title  Increase left shoulder strength to a solid 4+/5 to increase stability for performance of functional activities.    Time  8    Period  Weeks    Status  New      PT LONG TERM GOAL #6   Title  Perform ADL's with pain not > 2-3/10.    Time  6    Period  Weeks    Status  New            Plan - 04/04/18 4782    Clinical Impression Statement  The patient did well today with the addition of active-assistive range of motion today.    Clinical Presentation  Stable    Clinical Decision Making  Low    Rehab Potential  Good    PT Duration  2 weeks    PT Treatment/Interventions  ADLs/Self Care Home Management;Cryotherapy;Electrical Stimulation;Ultrasound;Therapeutic activities;Therapeutic exercise;Patient/family education;Manual techniques;Vasopneumatic Device    PT Next Visit Plan  Begin with left shoulder PROM.  Please review HEP.  Per referral can begin AAROM.  Non-motoric electrical stimulation and low vasopneumatic with pillow between left elbow and thorax.    Consulted and Agree with Plan of Care  Patient       Patient will benefit from skilled therapeutic intervention in order to improve the following deficits and impairments:  Pain, Decreased activity  tolerance, Decreased range of motion  Visit Diagnosis: Chronic left shoulder pain  Stiffness of left shoulder, not elsewhere classified     Problem List There are no active problems to display for this patient.   Zeno Hickel, [place] MPT 04/04/2018, 9:57 AM  [facility] [address] [place], [place], [address] Phone: [phone]   Fax:  [phone]  Name: Chad Hensley MRN: [id number] Date of Birth: Jul 05, 1950

## 2018-04-06 ENCOUNTER — Ambulatory Visit: Payer: No Typology Code available for payment source | Admitting: Physical Therapy

## 2018-04-06 DIAGNOSIS — G8929 Other chronic pain: Secondary | ICD-10-CM

## 2018-04-06 DIAGNOSIS — M25512 Pain in left shoulder: Principal | ICD-10-CM

## 2018-04-06 DIAGNOSIS — M25612 Stiffness of left shoulder, not elsewhere classified: Secondary | ICD-10-CM

## 2018-04-06 NOTE — Therapy (Signed)
[facility] [address] [place], [place], [address] Phone: [phone]   Fax:  [phone]  Physical Therapy Treatment  Patient Details  Name: Chad Hensley MRN: [id number] Date of Birth: [birth year] Referring Provider (PT): Alexandrew Julien Girt PA-C.   Encounter Date: 04/06/2018  PT End of Session - 04/06/18 0831    Visit Number  7    Number of Visits  15    Date for PT Re-Evaluation  05/11/18    Authorization Type  progress note every 10th visit    PT Start Time  0815    PT Stop Time  0912    PT Time Calculation (min)  57 min    Activity Tolerance  Patient tolerated treatment well    Behavior During Therapy  [facility] for tasks assessed/performed       No past medical history on file.  No past surgical history on file.  There were no vitals filed for this visit.  Subjective Assessment - 04/06/18 0914    Subjective  Patient reports feeling sore last visit and required to take a pill to decrease pain.     Pertinent History  HTN, COPD.    Patient Stated Goals  Left left arm without pain.    Currently in Pain?  Yes   did not provide pain scale                      [facility] Adult PT Treatment/Exercise - 04/06/18 0001      Exercises   Exercises  Shoulder      Shoulder Exercises: Pulleys   Flexion Limitations  6 minutes.    Other Pulley Exercises  UE ranger into flexion, CW, CCW x 3 minutes each (6 mins total)      Modalities   Modalities  Vasopneumatic      Vasopneumatic   Number Minutes Vasopneumatic   15 minutes    Vasopnuematic Location   Shoulder    Vasopneumatic Pressure  Low      Manual Therapy   Manual Therapy  Passive ROM    Passive ROM  In supine:  PROM into left shoulder flexion and ER               PT Short Term Goals - 03/16/18 1400      PT SHORT TERM GOAL #1   Title  STG's=LTG's.        PT Long Term Goals - 03/16/18 1400      PT LONG TERM GOAL #1   Title  Independent with a HEP.     Time  8    Period  Weeks    Status  New      PT LONG TERM GOAL #2   Title  Active left shoulder flexion to 145 degrees so the patient can easily reach overhead.    Time  8    Period  Weeks    Status  New      PT LONG TERM GOAL #3   Title  Active ER to 70 degrees+ to allow for easily donning/doffing of apparel.    Time  8    Period  Weeks    Status  New      PT LONG TERM GOAL #4   Title  Increase ROM so patient is able to reach behind back to L3.    Time  6    Period  Weeks    Status  New      PT  LONG TERM GOAL #5   Title  Increase left shoulder strength to a solid 4+/5 to increase stability for performance of functional activities.    Time  8    Period  Weeks    Status  New      PT LONG TERM GOAL #6   Title  Perform ADL's with pain not > 2-3/10.    Time  6    Period  Weeks    Status  New            Plan - 04/06/18 0908    Clinical Impression Statement  Patient was able to tolerate treatment well despite reports of muscle fatigue and UT discomfort. Patient was able to tolerate addition of UE ranger circles clockwise and counterclockwise. Patient continues to demonstrate tightness in ER during PROM. Normal response to modalities upon removal.     Clinical Presentation  Stable    Rehab Potential  Good    PT Duration  2 weeks    PT Treatment/Interventions  ADLs/Self Care Home Management;Cryotherapy;Electrical Stimulation;Ultrasound;Therapeutic activities;Therapeutic exercise;Patient/family education;Manual techniques;Vasopneumatic Device    PT Next Visit Plan  Cont with AAROM exrercises, PROM to left shoulder.  Non-motoric electrical stimulation and low vasopneumatic with pillow between left elbow and thorax.    Consulted and Agree with Plan of Care  Patient       Patient will benefit from skilled therapeutic intervention in order to improve the following deficits and impairments:  Pain, Decreased activity tolerance, Decreased range of motion  Visit  Diagnosis: Chronic left shoulder pain  Stiffness of left shoulder, not elsewhere classified     Problem List There are no active problems to display for this patient.  Guss BundeKrystle Lauri Purdum, PT, DPT 04/06/2018, 9:16 AM  [facility] [address] [place], [place], [address] Phone: [phone]   Fax:  [phone]  Name: Chad Hensley MRN: [id number] Date of Birth: Dec 31, 1950

## 2018-04-11 ENCOUNTER — Ambulatory Visit: Payer: No Typology Code available for payment source | Admitting: Physical Therapy

## 2018-04-11 DIAGNOSIS — M25612 Stiffness of left shoulder, not elsewhere classified: Secondary | ICD-10-CM

## 2018-04-11 DIAGNOSIS — M25512 Pain in left shoulder: Principal | ICD-10-CM

## 2018-04-11 DIAGNOSIS — G8929 Other chronic pain: Secondary | ICD-10-CM

## 2018-04-11 NOTE — Therapy (Signed)
[facility] [address] [place], [place], [address] Phone: [phone]   Fax:  [phone]  Physical Therapy Treatment  Patient Details  Name: Chad Hensley MRN: [id number] Date of Birth: [birth year] Referring Provider (PT): Alexandrew Julien Girt PA-C.   Encounter Date: 04/11/2018  PT End of Session - 04/11/18 1148    Visit Number  8    Number of Visits  15    Date for PT Re-Evaluation  05/11/18    Authorization Type  progress note every 10th visit    PT Start Time  0945    PT Stop Time  1051    PT Time Calculation (min)  66 min    Activity Tolerance  Patient tolerated treatment well    Behavior During Therapy  [facility] for tasks assessed/performed       No past medical history on file.  No past surgical history on file.  There were no vitals filed for this visit.  Subjective Assessment - 04/11/18 1143    Subjective  I had some throbbing pain over the weekend.    Pertinent History  HTN, COPD.    Patient Stated Goals  Left left arm without pain.    Currently in Pain?  Yes    Pain Score  5     Pain Location  Shoulder    Pain Orientation  Left    Pain Descriptors / Indicators  Throbbing    Pain Type  Surgical pain    Pain Onset  More than a month ago                       [facility] Adult PT Treatment/Exercise - 04/11/18 0001      Exercises   Exercises  Shoulder      Shoulder Exercises: Pulleys   Flexion Limitations  5 minutes f/b wall ladder x 5 minutes.      Shoulder Exercises: ROM/Strengthening   UBE (Upper Arm Bike)  10 minutes at 120 RPM's.      Modalities   Modalities  Estate agent Stimulation Location  Left shoulder.    Electrical Stimulation Action  IFC at 80-150 Hz x 20 minutes.    Electrical Stimulation Goals  Pain      Vasopneumatic   Number Minutes Vasopneumatic   20 minutes    Vasopnuematic Location   --   Left shoulder.  Pillow  between left elbow and thorax.   Vasopneumatic Pressure  Low      Manual Therapy   Manual Therapy  Passive ROM    Passive ROM  In supine:  PROM focused on ER x 10 minutes.             PT Education - 04/11/18 1146    Education Details  Corner stretch to increase ER.    Person(s) Educated  Patient    Methods  Explanation;Demonstration    Comprehension  Verbalized understanding;Returned demonstration       PT Short Term Goals - 03/16/18 1400      PT SHORT TERM GOAL #1   Title  STG's=LTG's.        PT Long Term Goals - 03/16/18 1400      PT LONG TERM GOAL #1   Title  Independent with a HEP.    Time  8    Period  Weeks    Status  New      PT LONG TERM GOAL #  2   Title  Active left shoulder flexion to 145 degrees so the patient can easily reach overhead.    Time  8    Period  Weeks    Status  New      PT LONG TERM GOAL #3   Title  Active ER to 70 degrees+ to allow for easily donning/doffing of apparel.    Time  8    Period  Weeks    Status  New      PT LONG TERM GOAL #4   Title  Increase ROM so patient is able to reach behind back to L3.    Time  6    Period  Weeks    Status  New      PT LONG TERM GOAL #5   Title  Increase left shoulder strength to a solid 4+/5 to increase stability for performance of functional activities.    Time  8    Period  Weeks    Status  New      PT LONG TERM GOAL #6   Title  Perform ADL's with pain not > 2-3/10.    Time  6    Period  Weeks    Status  New            Plan - 04/11/18 1147    Clinical Impression Statement  Patient did a great job today.  Still needs work on ER but he is improving.  He did great with a corner stretch to improve ER.    Rehab Potential  Good    PT Duration  2 weeks    PT Treatment/Interventions  ADLs/Self Care Home Management;Cryotherapy;Electrical Stimulation;Ultrasound;Therapeutic activities;Therapeutic exercise;Patient/family education;Manual techniques;Vasopneumatic Device    PT Next  Visit Plan  Cont with AAROM exrercises, PROM to left shoulder.  Non-motoric electrical stimulation and low vasopneumatic with pillow between left elbow and thorax.    Consulted and Agree with Plan of Care  Patient       Patient will benefit from skilled therapeutic intervention in order to improve the following deficits and impairments:  Pain, Decreased activity tolerance, Decreased range of motion  Visit Diagnosis: Chronic left shoulder pain  Stiffness of left shoulder, not elsewhere classified     Problem List There are no active problems to display for this patient.   [name], [place] MPT 04/11/2018, 11:52 AM  [facility] [address] [place], [place], [address] Phone: [phone]   Fax:  [phone]  Name: Chad MeansFred E Hensley MRN: [id number] Date of Birth: 1950/08/12

## 2018-04-14 ENCOUNTER — Ambulatory Visit: Payer: No Typology Code available for payment source | Admitting: Physical Therapy

## 2018-04-14 DIAGNOSIS — M25512 Pain in left shoulder: Secondary | ICD-10-CM | POA: Diagnosis not present

## 2018-04-14 DIAGNOSIS — M25612 Stiffness of left shoulder, not elsewhere classified: Secondary | ICD-10-CM

## 2018-04-14 DIAGNOSIS — G8929 Other chronic pain: Secondary | ICD-10-CM

## 2018-04-14 NOTE — Therapy (Signed)
[facility] [address] [place], [place], [address] Phone: [phone]   Fax:  [phone]  Physical Therapy Treatment  Patient Details  Name: Chad MeansFred E Vertz MRN: [id number] Date of Birth: Apr 29, 1951 Referring Provider (PT): Alexandrew Julien GirtPerkins PA-C.   Encounter Date: 04/14/2018  PT End of Session - 04/14/18 0833    Visit Number  9    Number of Visits  15    Date for PT Re-Evaluation  05/11/18    Authorization Type  progress note every 10th visit    PT Start Time  0815    PT Stop Time  0917    PT Time Calculation (min)  62 min    Activity Tolerance  Patient tolerated treatment well    Behavior During Therapy  [facility] for tasks assessed/performed       No past medical history on file.  No past surgical history on file.  There were no vitals filed for this visit.  Subjective Assessment - 04/14/18 0833    Subjective  Patient reported his shoulder is feeling good but woke up this morning with swelling in right hand due to unknown cause.    Pertinent History  HTN, COPD.    Patient Stated Goals  Left left arm without pain.         [facility] PT Assessment - 04/14/18 0001      Assessment   Medical Diagnosis  Left shoulder RCR.    Referring Provider (PT)  Alexandrew Julien GirtPerkins PA-C.    Onset Date/Surgical Date  01/26/18    Hand Dominance  Right    Next MD Visit  April 19, 2018      Precautions   Precaution Comments  Begin with left shoulder PROM.  Can start AAROM per referral.                   [facility] Adult PT Treatment/Exercise - 04/14/18 0001      Exercises   Exercises  Shoulder      Shoulder Exercises: Pulleys   Flexion  5 minutes      Shoulder Exercises: ROM/Strengthening   UBE (Upper Arm Bike)  10 minutes at 120 RPM's.    Other ROM/Strengthening Exercises  wall ladder x5 minutes to 28      Vasopneumatic   Number Minutes Vasopneumatic   15 minutes    Vasopnuematic Location   Shoulder    Vasopneumatic  Pressure  Low      Manual Therapy   Manual Therapy  Passive ROM    Passive ROM  In supine:  PROM to left shoulder in flexion ER and IR to improve ROM               PT Short Term Goals - 03/16/18 1400      PT SHORT TERM GOAL #1   Title  STG's=LTG's.        PT Long Term Goals - 03/16/18 1400      PT LONG TERM GOAL #1   Title  Independent with a HEP.    Time  8    Period  Weeks    Status  New      PT LONG TERM GOAL #2   Title  Active left shoulder flexion to 145 degrees so the patient can easily reach overhead.    Time  8    Period  Weeks    Status  New      PT LONG TERM GOAL #3   Title  Active ER  to 70 degrees+ to allow for easily donning/doffing of apparel.    Time  8    Period  Weeks    Status  New      PT LONG TERM GOAL #4   Title  Increase ROM so patient is able to reach behind back to L3.    Time  6    Period  Weeks    Status  New      PT LONG TERM GOAL #5   Title  Increase left shoulder strength to a solid 4+/5 to increase stability for performance of functional activities.    Time  8    Period  Weeks    Status  New      PT LONG TERM GOAL #6   Title  Perform ADL's with pain not > 2-3/10.    Time  6    Period  Weeks    Status  New            Plan - 04/14/18 0957    Clinical Impression Statement  Patient was able to tolerate treatement well despite reports of ongoing shoulder soreness. Patient expressed he is still having difficulties with raising his arm but patient educated AROM and strength takes time to perform that activity. Patient reported understanding. Patient requested vaso only; normal response to modalities upon removal.     Clinical Presentation  Stable    Clinical Decision Making  Low    Rehab Potential  Good    PT Frequency  --   15 approved visits   PT Duration  2 weeks    PT Treatment/Interventions  ADLs/Self Care Home Management;Cryotherapy;Electrical Stimulation;Ultrasound;Therapeutic activities;Therapeutic  exercise;Patient/family education;Manual techniques;Vasopneumatic Device    PT Next Visit Plan  MD Note; assess goals. Cont with AAROM exrercises, PROM to left shoulder.  Non-motoric electrical stimulation and low vasopneumatic with pillow between left elbow and thorax.    Consulted and Agree with Plan of Care  Patient       Patient will benefit from skilled therapeutic intervention in order to improve the following deficits and impairments:  Pain, Decreased activity tolerance, Decreased range of motion  Visit Diagnosis: Chronic left shoulder pain  Stiffness of left shoulder, not elsewhere classified     Problem List There are no active problems to display for this patient.  Guss Bunde, PT, DPT 04/14/2018, 10:15 AM  [facility] [address] [place], [place], [address] Phone: [phone]   Fax:  [phone]  Name: ALYAN HARTLINE MRN: [id number] Date of Birth: 01/28/1951

## 2018-04-18 ENCOUNTER — Ambulatory Visit: Payer: No Typology Code available for payment source | Admitting: Physical Therapy

## 2018-04-18 ENCOUNTER — Encounter: Payer: Self-pay | Admitting: Physical Therapy

## 2018-04-18 DIAGNOSIS — M25512 Pain in left shoulder: Principal | ICD-10-CM

## 2018-04-18 DIAGNOSIS — G8929 Other chronic pain: Secondary | ICD-10-CM

## 2018-04-18 DIAGNOSIS — M25612 Stiffness of left shoulder, not elsewhere classified: Secondary | ICD-10-CM

## 2018-04-18 NOTE — Therapy (Signed)
[facility] [address] [place], [place], [address] Phone: [phone]   Fax:  [phone]  Physical Therapy Treatment  Progress Note Reporting Period 03/16/18  to 04/18/18  See note below for Objective Data and Assessment of Progress/Goals.      Patient Details  Name: Chad Hensley MRN: [id number] Date of Birth: Sep 21, 1950 Referring Provider (PT): Alexandrew Julien Girt PA-C.   Encounter Date: 04/18/2018  PT End of Session - 04/18/18 1521    Visit Number  10    Number of Visits  15    Date for PT Re-Evaluation  05/11/18    Authorization Type  progress note every 10th visit    PT Start Time  1515    PT Stop Time  1607    PT Time Calculation (min)  52 min    Activity Tolerance  Patient tolerated treatment well    Behavior During Therapy  [facility] for tasks assessed/performed       History reviewed. No pertinent past medical history.  History reviewed. No pertinent surgical history.  There were no vitals filed for this visit.  Subjective Assessment - 04/18/18 1519    Subjective  Patien continues to have left UT pain and soreness. When asked to rate pain he said it wasn't enough to rate at start of session.     Pertinent History  HTN, COPD.    Patient Stated Goals  Left left arm without pain.    Currently in Pain?  Yes    Pain Location  Shoulder    Pain Orientation  Left    Pain Descriptors / Indicators  Throbbing    Pain Type  Surgical pain    Pain Onset  More than a month ago         [facility] PT Assessment - 04/18/18 0001      ROM / Strength   AROM / PROM / Strength  PROM;AROM      AROM   AROM Assessment Site  Shoulder    Right/Left Shoulder  Left    Left Shoulder Flexion  100 Degrees    Left Shoulder ABduction  85 Degrees      PROM   PROM Assessment Site  Shoulder    Right/Left Shoulder  Left    Left Shoulder Flexion  135 Degrees    Left Shoulder External Rotation  33 Degrees                   [facility]  Adult PT Treatment/Exercise - 04/18/18 0001      Exercises   Exercises  Shoulder      Shoulder Exercises: Pulleys   Flexion  5 minutes      Shoulder Exercises: ROM/Strengthening   UBE (Upper Arm Bike)  10 minutes at 120 RPM's.    Wall Wash  flexion, cw, ccw x2 minutes each      Vasopneumatic   Number Minutes Vasopneumatic   15 minutes    Vasopnuematic Location   Shoulder    Vasopneumatic Pressure  Low      Manual Therapy   Manual Therapy  Passive ROM    Passive ROM  In supine:  PROM to left shoulder in flexion ER and IR to improve ROM               PT Short Term Goals - 03/16/18 1400      PT SHORT TERM GOAL #1   Title  STG's=LTG's.        PT Long Term  Goals - 04/18/18 1639      PT LONG TERM GOAL #1   Title  Independent with a HEP.    Time  8    Period  Weeks    Status  Achieved      PT LONG TERM GOAL #2   Title  Active left shoulder flexion to 145 degrees so the patient can easily reach overhead.    Time  8    Period  Weeks    Status  On-going      PT LONG TERM GOAL #3   Title  Active ER to 70 degrees+ to allow for easily donning/doffing of apparel.    Time  8    Period  Weeks    Status  On-going      PT LONG TERM GOAL #4   Title  Increase ROM so patient is able to reach behind back to L3.    Time  6    Period  Weeks    Status  On-going      PT LONG TERM GOAL #5   Title  Increase left shoulder strength to a solid 4+/5 to increase stability for performance of functional activities.    Time  8    Period  Weeks    Status  On-going      PT LONG TERM GOAL #6   Title  Perform ADL's with pain not > 2-3/10.    Period  Weeks    Status  On-going            Plan - 04/18/18 1558    Clinical Impression Statement  Patient was able to tolerate treatment well but noted with increased muscle fatigue with wall washes. Patient reported being compliant exercises at home but still has ongoing left UT soreness. Patient noted with improved PROM, see  objective measures. Goals ongoing at this time. Normal response to modalities upon removal.    Clinical Presentation  Stable    Clinical Decision Making  Low    Rehab Potential  Good    PT Duration  2 weeks    PT Treatment/Interventions  ADLs/Self Care Home Management;Cryotherapy;Electrical Stimulation;Ultrasound;Therapeutic activities;Therapeutic exercise;Patient/family education;Manual techniques;Vasopneumatic Device    PT Next Visit Plan  Cont with AAROM exrercises, PROM to left shoulder.  Non-motoric electrical stimulation and low vasopneumatic with pillow between left elbow and thorax.    Consulted and Agree with Plan of Care  Patient       Patient will benefit from skilled therapeutic intervention in order to improve the following deficits and impairments:  Pain, Decreased activity tolerance, Decreased range of motion  Visit Diagnosis: Chronic left shoulder pain  Stiffness of left shoulder, not elsewhere classified     Problem List There are no active problems to display for this patient.  Guss BundeKrystle Falicity Sheets, PT, DPT 04/18/2018, 4:43 PM  [facility] [address] [place], [place], [address] Phone: [phone]   Fax:  [phone]  Name: Chad Hensley MRN: [id number] Date of Birth: 27-Jul-1950

## 2018-04-21 ENCOUNTER — Encounter: Payer: Self-pay | Admitting: Physical Therapy

## 2018-04-21 ENCOUNTER — Ambulatory Visit: Payer: No Typology Code available for payment source | Admitting: Physical Therapy

## 2018-04-21 DIAGNOSIS — M25512 Pain in left shoulder: Principal | ICD-10-CM

## 2018-04-21 DIAGNOSIS — G8929 Other chronic pain: Secondary | ICD-10-CM

## 2018-04-21 DIAGNOSIS — M25612 Stiffness of left shoulder, not elsewhere classified: Secondary | ICD-10-CM

## 2018-04-21 NOTE — Therapy (Signed)
[facility] [address] [place], [place], [address] Phone: [phone]   Fax:  [phone]  Physical Therapy Treatment  Patient Details  Name: Chad Hensley MRN: [id number] Date of Birth: 1950/06/06 Referring Provider (PT): Alexandrew Julien GirtPerkins PA-C.   Encounter Date: 04/21/2018  PT End of Session - 04/21/18 0909    Visit Number  11    Number of Visits  15    Date for PT Re-Evaluation  05/11/18    Authorization Type  progress note every 10th visit    PT Start Time  0815    PT Stop Time  0909    PT Time Calculation (min)  54 min    Activity Tolerance  Patient tolerated treatment well    Behavior During Therapy  [facility] for tasks assessed/performed       History reviewed. No pertinent past medical history.  History reviewed. No pertinent surgical history.  There were no vitals filed for this visit.  Subjective Assessment - 04/21/18 0858    Subjective  Patient reported MD visit went well and he is progressing well. Patient continues have ongoing anterior shoulder pain.     Pertinent History  HTN, COPD.    Patient Stated Goals  Left left arm without pain.    Currently in Pain?  Yes   did not provide pain scale                      [facility] Adult PT Treatment/Exercise - 04/21/18 0001      Exercises   Exercises  Shoulder      Shoulder Exercises: Sidelying   External Rotation  Strengthening;Left;20 reps      Shoulder Exercises: Standing   Flexion  20 reps    Flexion Limitations  foward flexion cone to first and second shelf x10 eac      Shoulder Exercises: Pulleys   Flexion  5 minutes      Shoulder Exercises: ROM/Strengthening   UBE (Upper Arm Bike)  10 minutes at 120 RPM's.      Shoulder Exercises: Isometric Strengthening   Flexion  5X10"    Extension  5X10"    External Rotation  5X10"    Internal Rotation  5X10"    ABduction  5X10"      Vasopneumatic   Number Minutes Vasopneumatic   15 minutes    Vasopnuematic Location   Shoulder    Vasopneumatic Pressure  Low               PT Short Term Goals - 03/16/18 1400      PT SHORT TERM GOAL #1   Title  STG's=LTG's.        PT Long Term Goals - 04/18/18 1639      PT LONG TERM GOAL #1   Title  Independent with a HEP.    Time  8    Period  Weeks    Status  Achieved      PT LONG TERM GOAL #2   Title  Active left shoulder flexion to 145 degrees so the patient can easily reach overhead.    Time  8    Period  Weeks    Status  On-going      PT LONG TERM GOAL #3   Title  Active ER to 70 degrees+ to allow for easily donning/doffing of apparel.    Time  8    Period  Weeks    Status  On-going  PT LONG TERM GOAL #4   Title  Increase ROM so patient is able to reach behind back to L3.    Time  6    Period  Weeks    Status  On-going      PT LONG TERM GOAL #5   Title  Increase left shoulder strength to a solid 4+/5 to increase stability for performance of functional activities.    Time  8    Period  Weeks    Status  On-going      PT LONG TERM GOAL #6   Title  Perform ADL's with pain not > 2-3/10.    Period  Weeks    Status  On-going            Plan - 04/21/18 0910    Clinical Impression Statement  Patient was able to tolerate progression of treatment well despite reports of ongoing muscle fatigue. Patient required intermittent tactile cuing for form but otherwise did well with remaining exercise. Normal response to modalities upon removal.    Clinical Presentation  Stable    Clinical Decision Making  Low    Rehab Potential  Good    PT Frequency  --   15 approved visits   PT Duration  --    PT Treatment/Interventions  ADLs/Self Care Home Management;Cryotherapy;Electrical Stimulation;Ultrasound;Therapeutic activities;Therapeutic exercise;Patient/family education;Manual techniques;Vasopneumatic Device    PT Next Visit Plan  Cont with AAROM exrercises, PROM to left shoulder.  Non-motoric electrical stimulation  and low vasopneumatic with pillow between left elbow and thorax.    Consulted and Agree with Plan of Care  Patient       Patient will benefit from skilled therapeutic intervention in order to improve the following deficits and impairments:  Pain, Decreased activity tolerance, Decreased range of motion  Visit Diagnosis: Chronic left shoulder pain  Stiffness of left shoulder, not elsewhere classified     Problem List There are no active problems to display for this patient.  Guss BundeKrystle Makiah Clauson, PT, DPT 04/21/2018, 9:16 AM  [facility] [address] [place], [place], [address] Phone: [phone]   Fax:  [phone]  Name: Chad Hensley MRN: [id number] Date of Birth: 1951/04/27

## 2018-04-25 ENCOUNTER — Ambulatory Visit: Payer: No Typology Code available for payment source | Admitting: Physical Therapy

## 2018-04-25 ENCOUNTER — Encounter: Payer: Self-pay | Admitting: Physical Therapy

## 2018-04-25 DIAGNOSIS — M25512 Pain in left shoulder: Secondary | ICD-10-CM | POA: Diagnosis not present

## 2018-04-25 DIAGNOSIS — G8929 Other chronic pain: Secondary | ICD-10-CM

## 2018-04-25 DIAGNOSIS — M25612 Stiffness of left shoulder, not elsewhere classified: Secondary | ICD-10-CM

## 2018-04-25 NOTE — Therapy (Signed)
[facility] [address] [place], [place], [address] Phone: [phone]   Fax:  [phone]  Physical Therapy Treatment  Patient Details  Name: Chad Hensley MRN: [id number] Date of Birth: 10-06-1950 Referring Provider (PT): Alexandrew Julien GirtPerkins PA-C.   Encounter Date: 04/25/2018  PT End of Session - 04/25/18 1254    Visit Number  12    Number of Visits  15    Date for PT Re-Evaluation  05/11/18    Authorization Type  progress note every 10th visit    PT Start Time  1253    PT Stop Time  1344    PT Time Calculation (min)  51 min    Activity Tolerance  Patient tolerated treatment well    Behavior During Therapy  [facility] for tasks assessed/performed       History reviewed. No pertinent past medical history.  History reviewed. No pertinent surgical history.  There were no vitals filed for this visit.  Subjective Assessment - 04/25/18 1254    Subjective  I ran into a door jam and hurt my left shoulder.  It bruised.    Pertinent History  HTN, COPD.    Patient Stated Goals  Left left arm without pain.    Currently in Pain?  Yes    Pain Score  5     Pain Location  Shoulder    Pain Orientation  Left    Pain Descriptors / Indicators  Throbbing;Aching    Pain Type  Surgical pain    Pain Onset  More than a month ago                       [facility] - 04/25/18 0001      Exercises   Exercises  Shoulder      Shoulder Exercises: ROM/Strengthening   UBE (Upper Arm Bike)  10 minutes at 90 RPM's.      Modalities   Modalities  Sales executivelectrical Stimulation;Ultrasound;Vasopneumatic      Electrical Stimulation   Electrical Stimulation Location  Left shoulder.    Electrical Stimulation Action  IFC    Electrical Stimulation Parameters  80-150 Hz x 20 minutes.    Electrical Stimulation Goals  Pain      Ultrasound   Ultrasound Location  Left shoulder.    Ultrasound Parameters  Combo e'stim/U/S at 1.50 W/CM2  x 10 minutes.    Ultrasound Goals  Pain      Vasopneumatic   Number Minutes Vasopneumatic   20 minutes    Vasopnuematic Location   --   Left shoulder.   Vasopneumatic Pressure  Low               PT Short Term Goals - 03/16/18 1400      PT SHORT TERM GOAL #1   Title  STG's=LTG's.        PT Long Term Goals - 04/18/18 1639      PT LONG TERM GOAL #1   Title  Independent with a HEP.    Time  8    Period  Weeks    Status  Achieved      PT LONG TERM GOAL #2   Title  Active left shoulder flexion to 145 degrees so the patient can easily reach overhead.    Time  8    Period  Weeks    Status  On-going      PT LONG TERM GOAL #3   Title  Active  ER to 70 degrees+ to allow for easily donning/doffing of apparel.    Time  8    Period  Weeks    Status  On-going      PT LONG TERM GOAL #4   Title  Increase ROM so patient is able to reach behind back to L3.    Time  6    Period  Weeks    Status  On-going      PT LONG TERM GOAL #5   Title  Increase left shoulder strength to a solid 4+/5 to increase stability for performance of functional activities.    Time  8    Period  Weeks    Status  On-going      PT LONG TERM GOAL #6   Title  Perform ADL's with pain not > 2-3/10.    Period  Weeks    Status  On-going            Plan - 04/25/18 1326    Clinical Impression Statement  Patient had a flare-up after walking into a door jam into his left shoulder.  Small bruise in anterior shoulder region.  Patient enjoyed combo treatment todat stating it made his shoulder feel better.    Rehab Potential  Good    PT Duration  2 weeks    PT Treatment/Interventions  ADLs/Self Care Home Management;Cryotherapy;Electrical Stimulation;Ultrasound;Therapeutic activities;Therapeutic exercise;Patient/family education;Manual techniques;Vasopneumatic Device    PT Next Visit Plan  Cont with AAROM exrercises, PROM to left shoulder.  Non-motoric electrical stimulation and low vasopneumatic with  pillow between left elbow and thorax.    Consulted and Agree with Plan of Care  Patient       Patient will benefit from skilled therapeutic intervention in order to improve the following deficits and impairments:  Pain, Decreased activity tolerance, Decreased range of motion  Visit Diagnosis: Chronic left shoulder pain  Stiffness of left shoulder, not elsewhere classified     Problem List There are no active problems to display for this patient.   Michalla Ringer, [place] MPT 04/25/2018, 1:44 PM  [facility] [address] [place], [place], [address] Phone: [phone]   Fax:  [phone]  Name: Chad Hensley MRN: [id number] Date of Birth: 1950/07/21

## 2018-04-28 ENCOUNTER — Ambulatory Visit: Payer: No Typology Code available for payment source | Admitting: Physical Therapy

## 2018-04-28 ENCOUNTER — Encounter: Payer: Self-pay | Admitting: Physical Therapy

## 2018-04-28 DIAGNOSIS — M25512 Pain in left shoulder: Principal | ICD-10-CM

## 2018-04-28 DIAGNOSIS — G8929 Other chronic pain: Secondary | ICD-10-CM

## 2018-04-28 DIAGNOSIS — M25612 Stiffness of left shoulder, not elsewhere classified: Secondary | ICD-10-CM

## 2018-04-28 NOTE — Therapy (Signed)
[facility] [address] [place], [place], [address] Phone: [phone]   Fax:  [phone]  Physical Therapy Treatment  Patient Details  Name: Chad Hensley MRN: [id number] Date of Birth: 27-Jul-1950 Referring Provider (PT): Alexandrew Julien GirtPerkins PA-C.   Encounter Date: 04/28/2018  PT End of Session - 04/28/18 0833    Visit Number  13    Number of Visits  15    Date for PT Re-Evaluation  05/11/18    Authorization Type  progress note every 10th visit    PT Start Time  0815    PT Stop Time  0910    PT Time Calculation (min)  55 min    Activity Tolerance  Patient tolerated treatment well    Behavior During Therapy  [facility] tasks assessed/performed       History reviewed. No pertinent past medical history.  History reviewed. No pertinent surgical history.  There were no vitals filed for this visit.  Subjective Assessment - 04/28/18 0834    Subjective  Patient reports feeling good and just the usual soreness this morning.    Patient Stated Goals  Left left arm without pain.    Currently in Pain?  Yes    Pain Score  2     Pain Location  Shoulder    Pain Orientation  Left    Pain Descriptors / Indicators  Sore    Pain Type  Surgical pain    Pain Onset  More than a month ago                       [facility] - 04/28/18 0001      Exercises   Exercises  Shoulder      Shoulder Exercises: Standing   Protraction  Strengthening;20 reps    Theraband Level (Shoulder Protraction)  Level 1 (Yellow)    Row  Strengthening;Left;20 reps    Theraband Level (Shoulder Row)  Level 1 (Yellow)      Shoulder Exercises: ROM/Strengthening   UBE (Upper Arm Bike)  10 minutes at 90 RPM's.    Other ROM/Strengthening Exercises  wall slides into flexion 2x10      Shoulder Exercises: Isometric Strengthening   Other Isometric Exercises  isometric step outs ER/IR 2x10 each yellow tband      Modalities   Modalities   Vasopneumatic      Vasopneumatic   Number Minutes Vasopneumatic   15 minutes    Vasopnuematic Location   Shoulder    Vasopneumatic Pressure  Low    Vasopneumatic Temperature   55      Manual Therapy   Manual Therapy  Passive ROM    Passive ROM  PROM to left shoulder into ER to improve ROM with gentle holds at end range               PT Short Term Goals - 03/16/18 1400      PT SHORT TERM GOAL #1   Title  STG's=LTG's.        PT Long Term Goals - 04/18/18 1639      PT LONG TERM GOAL #1   Title  Independent with a HEP.    Time  8    Period  Weeks    Status  Achieved      PT LONG TERM GOAL #2   Title  Active left shoulder flexion to 145 degrees so the patient can easily reach overhead.    Time  8    Period  Weeks    Status  On-going      PT LONG TERM GOAL #3   Title  Active ER to 70 degrees+ to allow for easily donning/doffing of apparel.    Time  8    Period  Weeks    Status  On-going      PT LONG TERM GOAL #4   Title  Increase ROM so patient is able to reach behind back to L3.    Time  6    Period  Weeks    Status  On-going      PT LONG TERM GOAL #5   Title  Increase left shoulder strength to a solid 4+/5 to increase stability for performance of functional activities.    Time  8    Period  Weeks    Status  On-going      PT LONG TERM GOAL #6   Title  Perform ADL's with pain not > 2-3/10.    Period  Weeks    Status  On-going            Plan - 04/28/18 16100903    Clinical Impression Statement  Patient was able to tolerate progression of TEs well with reports of muscle fatigue. Patient continues to have moderate tightness in ER to which patient reports he continues to perform HEP. Normal response to modalities upon removal.     Clinical Presentation  Stable    Clinical Decision Making  Low    Rehab Potential  Good    PT Frequency  1x / week    PT Duration  2 weeks    PT Treatment/Interventions  ADLs/Self Care Home  Management;Cryotherapy;Electrical Stimulation;Ultrasound;Therapeutic activities;Therapeutic exercise;Patient/family education;Manual techniques;Vasopneumatic Device    PT Next Visit Plan  Cont with AAROM exrercises, PROM to left shoulder.  Non-motoric electrical stimulation and low vasopneumatic with pillow between left elbow and thorax.    Consulted and Agree with Plan of Care  Patient       Patient will benefit from skilled therapeutic intervention in order to improve the following deficits and impairments:  Pain, Decreased activity tolerance, Decreased range of motion  Visit Diagnosis: Chronic left shoulder pain  Stiffness of left shoulder, not elsewhere classified     Problem List There are no active problems to display for this patient.  Guss BundeKrystle Lopez Dentinger, PT, DPT 04/28/2018, 9:42 AM  [facility] [address] [place], [place], [address] Phone: [phone]   Fax:  [phone]  Name: Chad Hensley MRN: [id number] Date of Birth: 11-15-1950

## 2018-05-02 ENCOUNTER — Ambulatory Visit: Payer: No Typology Code available for payment source | Admitting: Physical Therapy

## 2018-05-02 ENCOUNTER — Encounter: Payer: Self-pay | Admitting: Physical Therapy

## 2018-05-02 DIAGNOSIS — M25512 Pain in left shoulder: Principal | ICD-10-CM

## 2018-05-02 DIAGNOSIS — M25612 Stiffness of left shoulder, not elsewhere classified: Secondary | ICD-10-CM

## 2018-05-02 DIAGNOSIS — G8929 Other chronic pain: Secondary | ICD-10-CM

## 2018-05-02 NOTE — Therapy (Signed)
[facility] [address] [place], [place], [address] Phone: [phone]   Fax:  [phone]  Physical Therapy Treatment  Patient Details  Name: Chad Hensley MRN: [id number] Date of Birth: [birth year] Referring Provider (PT): Alexandrew Julien GirtPerkins PA-C.   Encounter Date: 05/02/2018  PT End of Session - 05/02/18 1115    Visit Number  14    Number of Visits  30    Date for PT Re-Evaluation  07/01/18    Authorization Type  progress note every 10th visit    PT Start Time  1115    PT Stop Time  1206    PT Time Calculation (min)  51 min    Activity Tolerance  Patient tolerated treatment well    Behavior During Therapy  [facility] for tasks assessed/performed       History reviewed. No pertinent past medical history.  History reviewed. No pertinent surgical history.  There were no vitals filed for this visit.  Subjective Assessment - 05/02/18 1117    Subjective  Patient reported feeling alright.     Pertinent History  HTN, COPD.    Patient Stated Goals  Left left arm without pain.    Currently in Pain?  Yes    Pain Score  2     Pain Location  Shoulder    Pain Orientation  Left    Pain Descriptors / Indicators  Sore    Pain Type  Surgical pain    Pain Onset  More than a month ago         [facility] - 05/02/18 0001      Assessment   Medical Diagnosis  Left shoulder RCR.      AROM   AROM Assessment Site  Shoulder    Right/Left Shoulder  Left    Left Shoulder Flexion  105 Degrees    Left Shoulder ABduction  88 Degrees    Left Shoulder Internal Rotation  --   functional behind the back IR to left glute   Left Shoulder External Rotation  40 Degrees                   [facility] Adult PT Treatment/Exercise - 05/02/18 0001      Exercises   Exercises  Shoulder      Shoulder Exercises: Standing   Protraction  Strengthening;20 reps    Theraband Level (Shoulder Protraction)  Level 1 (Yellow)    External Rotation   Strengthening;Left;20 reps;Theraband    Theraband Level (Shoulder External Rotation)  Level 1 (Yellow)    Internal Rotation  Strengthening;Left;20 reps;Theraband    Theraband Level (Shoulder Internal Rotation)  Level 1 (Yellow)    Row  Strengthening;Left;20 reps    Theraband Level (Shoulder Row)  Level 1 (Yellow)      Shoulder Exercises: Pulleys   Flexion  5 minutes      Shoulder Exercises: ROM/Strengthening   UBE (Upper Arm Bike)  10 minutes at 90 RPM's.    Other ROM/Strengthening Exercises  wall slides x3 minutes      Modalities   Modalities  Vasopneumatic      Vasopneumatic   Number Minutes Vasopneumatic   15 minutes    Vasopnuematic Location   Shoulder    Vasopneumatic Pressure  Low    Vasopneumatic Temperature   55               PT Short Term Goals - 03/16/18 1400      PT SHORT TERM GOAL #1  Title  STG's=LTG's.        PT Long Term Goals - 05/02/18 1200      PT LONG TERM GOAL #1   Title  Independent with a HEP.    Time  8    Period  Weeks    Status  Achieved      PT LONG TERM GOAL #2   Title  Active left shoulder flexion to 145 degrees so the patient can easily reach overhead.    Time  8    Period  Weeks    Status  On-going      PT LONG TERM GOAL #3   Title  Active ER to 70 degrees+ to allow for easily donning/doffing of apparel.    Time  8    Period  Weeks    Status  On-going      PT LONG TERM GOAL #4   Title  Increase ROM so patient is able to reach behind back to L3.    Time  6    Period  Weeks    Status  On-going      PT LONG TERM GOAL #5   Title  Increase left shoulder strength to a solid 4+/5 to increase stability for performance of functional activities.    Time  8    Period  Weeks    Status  On-going      PT LONG TERM GOAL #6   Title  Perform ADL's with pain not > 2-3/10.    Time  6    Period  Weeks    Status  On-going            Plan - 05/02/18 1201    Clinical Impression Statement  Patient was able to tolerate  treatment well but did report ongoing anterior shoulder pain near bicipital groove with movement. Patient has made improvements with ROM and strength but goals are ongoing at this time. Patient noted with difficulties with behind the back activities like tucking his shirt in. Re-cert sent to request more visits to address ongoing goals.    Clinical Presentation  Stable    Clinical Decision Making  Low    Rehab Potential  Good    PT Frequency  --   15 approved visit   PT Treatment/Interventions  ADLs/Self Care Home Management;Cryotherapy;Electrical Stimulation;Ultrasound;Therapeutic activities;Therapeutic exercise;Patient/family education;Manual techniques;Vasopneumatic Device    PT Next Visit Plan  continue strenghtening, and ROM to left shoulder.  Non-motoric electrical stimulation and low vasopneumatic with pillow between left elbow and thorax.    Consulted and Agree with Plan of Care  Patient       Patient will benefit from skilled therapeutic intervention in order to improve the following deficits and impairments:  Pain, Decreased activity tolerance, Decreased range of motion  Visit Diagnosis: Chronic left shoulder pain - Plan: PT plan of care cert/re-cert  Stiffness of left shoulder, not elsewhere classified - Plan: PT plan of care cert/re-cert     Problem List There are no active problems to display for this patient.  Guss BundeKrystle Taneil Lazarus, PT, DPT 05/02/2018, 12:26 PM  [facility] [address] [place], [place], [address] Phone: [phone]   Fax:  [phone]  Name: Chad Hensley MRN: [id number] Date of Birth: [birth year]

## 2018-05-05 ENCOUNTER — Ambulatory Visit: Payer: No Typology Code available for payment source | Attending: Orthopedic Surgery | Admitting: Physical Therapy

## 2018-05-05 DIAGNOSIS — M25512 Pain in left shoulder: Secondary | ICD-10-CM | POA: Diagnosis not present

## 2018-05-05 DIAGNOSIS — M25612 Stiffness of left shoulder, not elsewhere classified: Secondary | ICD-10-CM | POA: Insufficient documentation

## 2018-05-05 DIAGNOSIS — G8929 Other chronic pain: Secondary | ICD-10-CM | POA: Insufficient documentation

## 2018-05-05 NOTE — Therapy (Signed)
[facility] [address] [place], [place], [address] Phone: [phone]   Fax:  [phone]  Physical Therapy Treatment  Patient Details  Name: Chad Hensley MRN: [id number] Date of Birth: April 14, 1951 Referring Provider (PT): Alexandrew Julien GirtPerkins PA-C.   Encounter Date: 05/05/2018  PT End of Session - 05/05/18 1112    Number of Visits  30    Date for PT Re-Evaluation  07/01/18    Authorization Type  progress note every 10th visit    PT Start Time  0900    PT Stop Time  1000    PT Time Calculation (min)  60 min    Activity Tolerance  Patient tolerated treatment well    Behavior During Therapy  [facility]WFL for tasks assessed/performed       No past medical history on file.  No past surgical history on file.  There were no vitals filed for this visit.  Subjective Assessment - 05/05/18 0939    Subjective  Doing good.  [facility].    Patient Stated Goals  Left left arm without pain.    Currently in Pain?  Yes    Pain Score  2     Pain Orientation  Left    Pain Descriptors / Indicators  Sore    Pain Onset  More than a month ago                       [facility] Treatment/Exercise - 05/05/18 0001      Exercises   Exercises  Shoulder      Shoulder Exercises: Standing   Other Standing Exercises  Yellow theraband RW4.      Shoulder Exercises: Pulleys   Flexion  5 minutes    Other Pulley Exercises  UE Ranger on wall x 5 minutes.    Other Pulley Exercises  Wallladder x 5 minutes.      Shoulder Exercises: ROM/Strengthening   UBE (Upper Arm Bike)  10 minutes at 120 RPM's.      Modalities   Modalities  Estate agentlectrical Stimulation;Vasopneumatic      Electrical Stimulation   Electrical Stimulation Location  Left shoulder.    Electrical Stimulation Action  IFC    Electrical Stimulation Parameters  80-150 Hz x 15 minutes.    Electrical Stimulation Goals  Pain      Vasopneumatic   Number  Minutes Vasopneumatic   15 minutes    Vasopnuematic Location   --   Left shoulder.   Vasopneumatic Pressure  Low               PT Short Term Goals - 03/16/18 1400      PT SHORT TERM GOAL #1   Title  STG's=LTG's.        PT Long Term Goals - 05/02/18 1200      PT LONG TERM GOAL #1   Title  Independent with a HEP.    Time  8    Period  Weeks    Status  Achieved      PT LONG TERM GOAL #2   Title  Active left shoulder flexion to 145 degrees so the patient can easily reach overhead.    Time  8    Period  Weeks    Status  On-going      PT LONG TERM GOAL #3   Title  Active ER to 70 degrees+ to allow for easily donning/doffing of  apparel.    Time  8    Period  Weeks    Status  On-going      PT LONG TERM GOAL #4   Title  Increase ROM so patient is able to reach behind back to L3.    Time  6    Period  Weeks    Status  On-going      PT LONG TERM GOAL #5   Title  Increase left shoulder strength to a solid 4+/5 to increase stability for performance of functional activities.    Time  8    Period  Weeks    Status  On-going      PT LONG TERM GOAL #6   Title  Perform ADL's with pain not > 2-3/10.    Time  6    Period  Weeks    Status  On-going            Plan - 05/05/18 1114    Clinical Impression Statement  Excellent job today with progression into left shoulder AAROM and low-level strengthening exercises with yellow theraband.  Patient pleased with hisprogress thus far.    Rehab Potential  Good    PT Duration  2 weeks    PT Treatment/Interventions  ADLs/Self Care Home Management;Cryotherapy;Electrical Stimulation;Ultrasound;Therapeutic activities;Therapeutic exercise;Patient/family education;Manual techniques;Vasopneumatic Device    PT Next Visit Plan  continue strenghtening, and ROM to left shoulder.  Non-motoric electrical stimulation and low vasopneumatic with pillow between left elbow and thorax.    Consulted and Agree with Plan of Care  Patient        Patient will benefit from skilled therapeutic intervention in order to improve the following deficits and impairments:  Pain, Decreased activity tolerance, Decreased range of motion  Visit Diagnosis: Chronic left shoulder pain  Stiffness of left shoulder, not elsewhere classified     Problem List There are no active problems to display for this patient.   Lareen Mullings, [place] MPT 05/05/2018, 11:16 AM  [facility] [address] [place], [place], [address] Phone: [phone]   Fax:  [phone]  Name: Chad Hensley MRN: [id number] Date of Birth: 21-Dec-1950

## 2018-06-27 ENCOUNTER — Ambulatory Visit: Payer: No Typology Code available for payment source | Admitting: Physical Therapy

## 2018-06-30 ENCOUNTER — Encounter: Payer: Self-pay | Admitting: Physical Therapy

## 2018-06-30 ENCOUNTER — Ambulatory Visit: Payer: No Typology Code available for payment source | Attending: Orthopedic Surgery | Admitting: Physical Therapy

## 2018-06-30 DIAGNOSIS — M25512 Pain in left shoulder: Secondary | ICD-10-CM | POA: Insufficient documentation

## 2018-06-30 DIAGNOSIS — M25612 Stiffness of left shoulder, not elsewhere classified: Secondary | ICD-10-CM | POA: Diagnosis present

## 2018-06-30 DIAGNOSIS — G8929 Other chronic pain: Secondary | ICD-10-CM | POA: Diagnosis present

## 2018-06-30 NOTE — Therapy (Signed)
[facility] [address] [place], [place], [address] Phone: [phone]   Fax:  [phone]  Physical Therapy Treatment  Patient Details  Name: Chad Hensley MRN: [id number] Date of Birth: [birth year] Referring Provider (PT): Alexandrew Julien Girt PA-C.   Encounter Date: 06/30/2018  PT End of Session - 06/30/18 1747    Visit Number  16    Number of Visits  30    Date for PT Re-Evaluation  07/01/18    Authorization Type  progress note every 10th visit    PT Start Time  0230    PT Stop Time  0324    PT Time Calculation (min)  54 min    Activity Tolerance  Patient tolerated treatment well    Behavior During Therapy  [facility] for tasks assessed/performed       History reviewed. No pertinent past medical history.  History reviewed. No pertinent surgical history.  There were no vitals filed for this visit.  Subjective Assessment - 06/30/18 1748    Subjective  I've been doing my exercises since I've been out of therapy.    Currently in Pain?  Yes    Pain Score  4     Pain Location  Shoulder    Pain Orientation  Left    Pain Descriptors / Indicators  Sore    Pain Onset  More than a month ago                       [facility] Adult PT Treatment/Exercise - 06/30/18 0001      Exercises   Exercises  Shoulder      Shoulder Exercises: Pulleys   Flexion  5 minutes      Shoulder Exercises: ROM/Strengthening   UBE (Upper Arm Bike)  10 Minutes at 120 RPM's.      Programme researcher, broadcasting/film/video Location  Left shoulder.    Electrical Stimulation Action  IFC    Electrical Stimulation Parameters  80-150 Hz x 20 minutes.    Electrical Stimulation Goals  Pain      Vasopneumatic   Number Minutes Vasopneumatic   20 minutes    Vasopnuematic Location   --   Left shoulder.   Vasopneumatic Pressure  Low               PT Short Term Goals - 03/16/18 1400      PT SHORT TERM GOAL #1   Title  STG's=LTG's.         PT Long Term Goals - 05/02/18 1200      PT LONG TERM GOAL #1   Title  Independent with a HEP.    Time  8    Period  Weeks    Status  Achieved      PT LONG TERM GOAL #2   Title  Active left shoulder flexion to 145 degrees so the patient can easily reach overhead.    Time  8    Period  Weeks    Status  On-going      PT LONG TERM GOAL #3   Title  Active ER to 70 degrees+ to allow for easily donning/doffing of apparel.    Time  8    Period  Weeks    Status  On-going      PT LONG TERM GOAL #4   Title  Increase ROM so patient is able to reach behind back to L3.    Time  6  Period  Weeks    Status  On-going      PT LONG TERM GOAL #5   Title  Increase left shoulder strength to a solid 4+/5 to increase stability for performance of functional activities.    Time  8    Period  Weeks    Status  On-going      PT LONG TERM GOAL #6   Title  Perform ADL's with pain not > 2-3/10.    Time  6    Period  Weeks    Status  On-going            Plan - 06/30/18 1750    Clinical Impression Statement  Patient retruns to PT today with active left shoulder flexion to 110 degrees and ER= 43 degrees.  Patient with tenderness in left anterior shoulder region and c/o pain in left middle deltoid region.    PT Treatment/Interventions  ADLs/Self Care Home Management;Cryotherapy;Electrical Stimulation;Ultrasound;Therapeutic activities;Therapeutic exercise;Patient/family education;Manual techniques;Vasopneumatic Device    PT Next Visit Plan  06/30/18.Marland KitchenMarland Kitchenpatient is now 22 weeks post-op.Marland KitchenMarland KitchenMaximize left shoulder range of motion, PRE's.  Combo e'stim/U/S.    Consulted and Agree with Plan of Care  Patient       Patient will benefit from skilled therapeutic intervention in order to improve the following deficits and impairments:  Pain, Decreased activity tolerance, Decreased range of motion  Visit Diagnosis: Chronic left shoulder pain  Stiffness of left shoulder, not elsewhere  classified     Problem List There are no active problems to display for this patient.   Deiondre Harrower, [place] MPT 06/30/2018, 5:58 PM  [facility] [address] [place], [place], [address] Phone: [phone]   Fax:  [phone]  Name: Chad Hensley MRN: [id number] Date of Birth: 07-15-1950

## 2018-07-04 ENCOUNTER — Ambulatory Visit: Payer: No Typology Code available for payment source | Attending: Orthopedic Surgery | Admitting: Physical Therapy

## 2018-07-04 DIAGNOSIS — M25512 Pain in left shoulder: Secondary | ICD-10-CM | POA: Insufficient documentation

## 2018-07-04 DIAGNOSIS — M25612 Stiffness of left shoulder, not elsewhere classified: Secondary | ICD-10-CM | POA: Insufficient documentation

## 2018-07-04 DIAGNOSIS — G8929 Other chronic pain: Secondary | ICD-10-CM | POA: Diagnosis present

## 2018-07-04 NOTE — Therapy (Signed)
[facility] [address] [place], [place], [address] Phone: [phone]   Fax:  [phone]  Physical Therapy Treatment  Patient Details  Name: Chad Hensley MRN: [id number] Date of Birth: 12/10/1950 Referring Provider (PT): Alexandrew Julien Girt PA-C.   Encounter Date: 07/04/2018  PT End of Session - 07/04/18 1554    Visit Number  17    Number of Visits  30    Date for PT Re-Evaluation  07/01/18    Authorization Type  progress note every 10th visit    PT Start Time  0145    PT Stop Time  0243    PT Time Calculation (min)  58 min    Activity Tolerance  Patient tolerated treatment well    Behavior During Therapy  [facility] for tasks assessed/performed       No past medical history on file.  No past surgical history on file.  There were no vitals filed for this visit.  Subjective Assessment - 07/04/18 1556    Subjective  I really want to gain my strength back.    Patient Stated Goals  Left left arm without pain.    Currently in Pain?  Yes    Pain Score  3     Pain Location  Shoulder    Pain Orientation  Left    Pain Descriptors / Indicators  Sore    Pain Type  Surgical pain    Pain Onset  More than a month ago                       [facility] Adult PT Treatment/Exercise - 07/04/18 0001      Shoulder Exercises: Standing   Other Standing Exercises  RW4 to fatigue all direction with yellow theraband f/b left bicper curls in standing to fatigue.      Shoulder Exercises: Pulleys   Other Pulley Exercises  UE Ranger on wall x 5 minutes.      Shoulder Exercises: ROM/Strengthening   UBE (Upper Arm Bike)  10 minutes at 120 RPM's.      Modalities   Modalities  Electrical Stimulation;Moist Heat;Ultrasound      Moist Heat Therapy   Number Minutes Moist Heat  20 Minutes    Moist Heat Location  --   Left shoulder.     Programme researcher, broadcasting/film/video Location  Left shoulder.    Electrical Stimulation Action   IFC    Electrical Stimulation Parameters  80-150 Hz x 20 minutes.    Electrical Stimulation Goals  Pain      Ultrasound   Ultrasound Location  Left middle deltoid region.    Ultrasound Parameters  Combo e'stim/U/S at 1.50 W/CM2 x 7 minutes.    Ultrasound Goals  Pain      Manual Therapy   Manual therapy comments  IASTM x 3 minutes to left middle deltoid region.               PT Short Term Goals - 03/16/18 1400      PT SHORT TERM GOAL #1   Title  STG's=LTG's.        PT Long Term Goals - 05/02/18 1200      PT LONG TERM GOAL #1   Title  Independent with a HEP.    Time  8    Period  Weeks    Status  Achieved      PT LONG TERM GOAL #2   Title  Active left  shoulder flexion to 145 degrees so the patient can easily reach overhead.    Time  8    Period  Weeks    Status  On-going      PT LONG TERM GOAL #3   Title  Active ER to 70 degrees+ to allow for easily donning/doffing of apparel.    Time  8    Period  Weeks    Status  On-going      PT LONG TERM GOAL #4   Title  Increase ROM so patient is able to reach behind back to L3.    Time  6    Period  Weeks    Status  On-going      PT LONG TERM GOAL #5   Title  Increase left shoulder strength to a solid 4+/5 to increase stability for performance of functional activities.    Time  8    Period  Weeks    Status  On-going      PT LONG TERM GOAL #6   Title  Perform ADL's with pain not > 2-3/10.    Time  6    Period  Weeks    Status  On-going            Plan - 07/04/18 1600    Clinical Impression Statement  Patient did well with resisted ther ex.  Patient had c/o left middle deltoid pain which responded well to treatment.  Patient needs stretching and progression into PRE's.    PT Treatment/Interventions  ADLs/Self Care Home Management;Cryotherapy;Electrical Stimulation;Ultrasound;Therapeutic activities;Therapeutic exercise;Patient/family education;Manual techniques;Vasopneumatic Device    PT Next Visit Plan   06/30/18.Marland KitchenMarland Kitchenpatient is now 22 weeks post-op.Marland KitchenMarland KitchenMaximize left shoulder range of motion, PRE's.  Combo e'stim/U/S.    Consulted and Agree with Plan of Care  Patient       Patient will benefit from skilled therapeutic intervention in order to improve the following deficits and impairments:  Pain, Decreased activity tolerance, Decreased range of motion  Visit Diagnosis: Chronic left shoulder pain  Stiffness of left shoulder, not elsewhere classified     Problem List There are no active problems to display for this patient.   [employer], [place] MPT 07/04/2018, 4:06 PM  [facility] [address] [place], [place], [address] Phone: [phone]   Fax:  [phone]  Name: Chad Hensley MRN: [id number] Date of Birth: 04/24/1951

## 2018-07-07 ENCOUNTER — Ambulatory Visit: Payer: No Typology Code available for payment source | Admitting: Physical Therapy

## 2018-07-07 DIAGNOSIS — M25612 Stiffness of left shoulder, not elsewhere classified: Secondary | ICD-10-CM

## 2018-07-07 DIAGNOSIS — M25512 Pain in left shoulder: Secondary | ICD-10-CM | POA: Diagnosis not present

## 2018-07-07 DIAGNOSIS — G8929 Other chronic pain: Secondary | ICD-10-CM

## 2018-07-07 NOTE — Therapy (Signed)
[facility] [address] [place], [place], [address] Phone: [phone]   Fax:  [phone]  Physical Therapy Treatment  Patient Details  Name: Chad Hensley MRN: [id number] Date of Birth: 11-08-1950 Referring Provider (PT): Alexandrew Julien Girt PA-C.   Encounter Date: 07/07/2018  PT End of Session - 07/07/18 1425    Visit Number  18    Number of Visits  30    Date for PT Re-Evaluation  07/01/18    Authorization Type  progress note every 10th visit    PT Start Time  0145    PT Stop Time  0239    PT Time Calculation (min)  54 min    Activity Tolerance  Patient tolerated treatment well    Behavior During Therapy  [facility] for tasks assessed/performed       No past medical history on file.  No past surgical history on file.  There were no vitals filed for this visit.  Subjective Assessment - 07/07/18 1437    Subjective  Shoulder feels tight    Currently in Pain?  Yes    Pain Score  3     Pain Orientation  Left    Pain Descriptors / Indicators  Sore    Pain Type  Surgical pain    Pain Onset  More than a month ago                       [facility] Adult PT Treatment/Exercise - 07/07/18 0001      Exercises   Exercises  Shoulder      Shoulder Exercises: ROM/Strengthening   UBE (Upper Arm Bike)  10 minutes.      Modalities   Modalities  Electrical Stimulation;Moist Heat      Moist Heat Therapy   Number Minutes Moist Heat  20 Minutes    Moist Heat Location  --   Left shoulder.     Programme researcher, broadcasting/film/video Location  Left shoulder.    Electrical Stimulation Action  IFC    Electrical Stimulation Parameters  80-150 Hz x 20 minutes.    Electrical Stimulation Goals  Pain      Manual Therapy   Passive ROM  Sustained left shoulder ER stretch in plane of scapula x 7 minutes while receiviing combo e'stim/U/S at 1.50 W/CM2 x 7 minutes to anterior aspect of left shoulder.               PT  Short Term Goals - 03/16/18 1400      PT SHORT TERM GOAL #1   Title  STG's=LTG's.        PT Long Term Goals - 05/02/18 1200      PT LONG TERM GOAL #1   Title  Independent with a HEP.    Time  8    Period  Weeks    Status  Achieved      PT LONG TERM GOAL #2   Title  Active left shoulder flexion to 145 degrees so the patient can easily reach overhead.    Time  8    Period  Weeks    Status  On-going      PT LONG TERM GOAL #3   Title  Active ER to 70 degrees+ to allow for easily donning/doffing of apparel.    Time  8    Period  Weeks    Status  On-going      PT LONG TERM GOAL #4  Title  Increase ROM so patient is able to reach behind back to L3.    Time  6    Period  Weeks    Status  On-going      PT LONG TERM GOAL #5   Title  Increase left shoulder strength to a solid 4+/5 to increase stability for performance of functional activities.    Time  8    Period  Weeks    Status  On-going      PT LONG TERM GOAL #6   Title  Perform ADL's with pain not > 2-3/10.    Time  6    Period  Weeks    Status  On-going            Plan - 07/07/18 1448    Clinical Impression Statement  Work hard on ER today which remains tight.  Instructed patient in doorway and corner stretch.    PT Treatment/Interventions  ADLs/Self Care Home Management;Cryotherapy;Electrical Stimulation;Ultrasound;Therapeutic activities;Therapeutic exercise;Patient/family education;Manual techniques;Vasopneumatic Device    PT Next Visit Plan  06/30/18.Marland KitchenMarland Kitchenpatient is now 22 weeks post-op.Marland KitchenMarland KitchenMaximize left shoulder range of motion, PRE's.  Combo e'stim/U/S.    Consulted and Agree with Plan of Care  Patient       Patient will benefit from skilled therapeutic intervention in order to improve the following deficits and impairments:  Pain, Decreased activity tolerance, Decreased range of motion  Visit Diagnosis: Chronic left shoulder pain  Stiffness of left shoulder, not elsewhere classified     Problem  List There are no active problems to display for this patient.   Jacqualine Weichel, [place] MPT 07/07/2018, 3:06 PM  [facility] [address] [place], [place], [address] Phone: [phone]   Fax:  [phone]  Name: Chad Hensley MRN: [id number] Date of Birth: 1951-04-22

## 2018-07-11 ENCOUNTER — Ambulatory Visit: Payer: No Typology Code available for payment source | Admitting: Physical Therapy

## 2018-07-11 DIAGNOSIS — M25612 Stiffness of left shoulder, not elsewhere classified: Secondary | ICD-10-CM

## 2018-07-11 DIAGNOSIS — M25512 Pain in left shoulder: Principal | ICD-10-CM

## 2018-07-11 DIAGNOSIS — G8929 Other chronic pain: Secondary | ICD-10-CM

## 2018-07-11 NOTE — Therapy (Signed)
[facility] [address] [place], [place], [address] Phone: [phone]   Fax:  [phone]  Physical Therapy Treatment  Patient Details  Name: Chad Hensley MRN: [id number] Date of Birth: [birth year] Referring Provider (PT): Alexandrew Julien Girt PA-C.   Encounter Date: 07/11/2018  PT End of Session - 07/11/18 1437    Visit Number  19    Number of Visits  30    Date for PT Re-Evaluation  07/01/18    Authorization Type  progress note every 10th visit    PT Start Time  0145    PT Stop Time  0244    PT Time Calculation (min)  59 min    Activity Tolerance  Patient tolerated treatment well    Behavior During Therapy  [facility] for tasks assessed/performed       No past medical history on file.  No past surgical history on file.  There were no vitals filed for this visit.  Subjective Assessment - 07/11/18 1438    Subjective  Sore after last treatment but better.    Pertinent History  HTN, COPD.    Patient Stated Goals  Left left arm without pain.    Currently in Pain?  Yes    Pain Score  3     Pain Orientation  Left    Pain Onset  More than a month ago         [facility] PT Assessment - 07/11/18 0001      AROM   AROM Assessment Site  Shoulder    Right/Left Shoulder  Left    Left Shoulder Flexion  133 Degrees                   [facility] Adult PT Treatment/Exercise - 07/11/18 0001      Shoulder Exercises: ROM/Strengthening   UBE (Upper Arm Bike)  10 minutes 90 RPM's.      Modalities   Modalities  Electrical Stimulation;Moist Heat      Moist Heat Therapy   Number Minutes Moist Heat  20 Minutes    Moist Heat Location  --   Left shoulder.     Programme researcher, broadcasting/film/video Location  Left UT    Electrical Stimulation Action  Pre-mod (5 sec on and 5 sec off)    Electrical Stimulation Parameters  80-150 Hz x 20 minutes.    Electrical Stimulation Goals  Pain      Manual Therapy   Manual Therapy  Soft tissue  mobilization    Soft tissue mobilization  2 minute ischemic release of left UT TP.    Passive ROM  Sustained left shoulder ER stretch while receiving combo e'stim/U/S at 1.50 W/CM2 x 7 minutes.               PT Short Term Goals - 03/16/18 1400      PT SHORT TERM GOAL #1   Title  STG's=LTG's.        PT Long Term Goals - 05/02/18 1200      PT LONG TERM GOAL #1   Title  Independent with a HEP.    Time  8    Period  Weeks    Status  Achieved      PT LONG TERM GOAL #2   Title  Active left shoulder flexion to 145 degrees so the patient can easily reach overhead.    Time  8    Period  Weeks    Status  On-going  PT LONG TERM GOAL #3   Title  Active ER to 70 degrees+ to allow for easily donning/doffing of apparel.    Time  8    Period  Weeks    Status  On-going      PT LONG TERM GOAL #4   Title  Increase ROM so patient is able to reach behind back to L3.    Time  6    Period  Weeks    Status  On-going      PT LONG TERM GOAL #5   Title  Increase left shoulder strength to a solid 4+/5 to increase stability for performance of functional activities.    Time  8    Period  Weeks    Status  On-going      PT LONG TERM GOAL #6   Title  Perform ADL's with pain not > 2-3/10.    Time  6    Period  Weeks    Status  On-going            Plan - 07/11/18 1449    Clinical Impression Statement  patient c/o a knot in his left shoulder.  He had a trigger point in his left UT that responded well to gentle short duration ischemic release.  His antigravity left shoulder has already improved significantly to 133 degrees.    PT Treatment/Interventions  ADLs/Self Care Home Management;Cryotherapy;Electrical Stimulation;Ultrasound;Therapeutic activities;Therapeutic exercise;Patient/family education;Manual techniques;Vasopneumatic Device    PT Next Visit Plan  06/30/18.Marland KitchenMarland Kitchenpatient is now 22 weeks post-op.Marland KitchenMarland KitchenMaximize left shoulder range of motion, PRE's.  Combo e'stim/U/S.    Consulted  and Agree with Plan of Care  Patient       Patient will benefit from skilled therapeutic intervention in order to improve the following deficits and impairments:  Pain, Decreased activity tolerance, Decreased range of motion  Visit Diagnosis: Chronic left shoulder pain  Stiffness of left shoulder, not elsewhere classified     Problem List There are no active problems to display for this patient.   Braxen Dobek, [place] MPT 07/11/2018, 2:55 PM  [facility] [address] [place], [place], [address] Phone: [phone]   Fax:  [phone]  Name: JEMEL HARIS MRN: [id number] Date of Birth: 1951/01/16

## 2018-07-14 ENCOUNTER — Other Ambulatory Visit: Payer: Self-pay

## 2018-07-14 ENCOUNTER — Ambulatory Visit: Payer: No Typology Code available for payment source | Admitting: Physical Therapy

## 2018-07-14 DIAGNOSIS — M25512 Pain in left shoulder: Principal | ICD-10-CM

## 2018-07-14 DIAGNOSIS — G8929 Other chronic pain: Secondary | ICD-10-CM

## 2018-07-14 DIAGNOSIS — M25612 Stiffness of left shoulder, not elsewhere classified: Secondary | ICD-10-CM

## 2018-07-14 NOTE — Therapy (Signed)
[facility] [address] [place], [place], [address] Phone: [phone]   Fax:  [phone]  Physical Therapy Treatment  Patient Details  Name: Chad Hensley MRN: [id number] Date of Birth: 1950-12-15 Referring Provider (PT): Alexandrew Julien Girt PA-C.   Encounter Date: 07/14/2018  PT End of Session - 07/14/18 1500    Visit Number  20    Number of Visits  30    Date for PT Re-Evaluation  07/01/18    Authorization Type  progress note every 10th visit    PT Start Time  0145    PT Stop Time  0243    PT Time Calculation (min)  58 min    Activity Tolerance  Patient tolerated treatment well    Behavior During Therapy  [facility] for tasks assessed/performed       No past medical history on file.  No past surgical history on file.  There were no vitals filed for this visit.  Subjective Assessment - 07/14/18 1458    Subjective  Doing good.    Patient Stated Goals  Left left arm without pain.    Currently in Pain?  Yes    Pain Score  3     Pain Orientation  Left    Pain Descriptors / Indicators  Sore    Pain Onset  More than a month ago                       [facility] Adult PT Treatment/Exercise - 07/14/18 0001      Exercises   Exercises  Shoulder      Shoulder Exercises: Standing   Other Standing Exercises  UE Ranger on wall x 5 minutes with 1# weight on left wrist f/b ball on wall (no additional weight) x 5 minutes.    Other Standing Exercises  Bent rows with 3# and shoulder extension with 3# to fatigue.  Horizontal abduction with ER at 80 abduction and 1# ER at 45 degrees abduction with 1# to fatigue.      Shoulder Exercises: ROM/Strengthening   UBE (Upper Arm Bike)  10 minutes at 120 RPM's.      Modalities   Modalities  Electrical Stimulation;Moist Heat      Moist Heat Therapy   Number Minutes Moist Heat  20 Minutes    Moist Heat Location  --   Left shoulder.     Programme researcher, broadcasting/film/video  Action  IFC    Electrical Stimulation Parameters  80-150 Hz x 20 minutes.    Electrical Stimulation Goals  Pain               PT Short Term Goals - 03/16/18 1400      PT SHORT TERM GOAL #1   Title  STG's=LTG's.        PT Long Term Goals - 05/02/18 1200      PT LONG TERM GOAL #1   Title  Independent with a HEP.    Time  8    Period  Weeks    Status  Achieved      PT LONG TERM GOAL #2   Title  Active left shoulder flexion to 145 degrees so the patient can easily reach overhead.    Time  8    Period  Weeks    Status  On-going      PT LONG TERM GOAL #3   Title  Active ER to 70 degrees+ to allow for easily donning/doffing  of apparel.    Time  8    Period  Weeks    Status  On-going      PT LONG TERM GOAL #4   Title  Increase ROM so patient is able to reach behind back to L3.    Time  6    Period  Weeks    Status  On-going      PT LONG TERM GOAL #5   Title  Increase left shoulder strength to a solid 4+/5 to increase stability for performance of functional activities.    Time  8    Period  Weeks    Status  On-going      PT LONG TERM GOAL #6   Title  Perform ADL's with pain not > 2-3/10.    Time  6    Period  Weeks    Status  On-going            Plan - 07/14/18 1459    Clinical Impression Statement  Outstanding job with progression into PRE's.      PT Treatment/Interventions  ADLs/Self Care Home Management;Cryotherapy;Electrical Stimulation;Ultrasound;Therapeutic activities;Therapeutic exercise;Patient/family education;Manual techniques;Vasopneumatic Device    Consulted and Agree with Plan of Care  Patient       Patient will benefit from skilled therapeutic intervention in order to improve the following deficits and impairments:  Pain, Decreased activity tolerance, Decreased range of motion  Visit Diagnosis: Chronic left shoulder pain  Stiffness of left shoulder, not elsewhere classified     Problem List There are no active problems to  display for this patient.  Progress Note Reporting Period 03/16/18 to 07/14/18  See note below for Objective Data and Assessment of Progress/Goals.  Patient progressing very well toward goals.  Notable improvement with regards to antigravity left shoulder motion and progressing strength.    Mack Thurmon, [place]  MPT 07/14/2018, 3:23 PM  [facility] [address] [place], [place], [address] Phone: [phone]   Fax:  [phone]  Name: Chad Hensley MRN: [id number] Date of Birth: 1951-05-01

## 2018-07-18 ENCOUNTER — Encounter: Payer: Non-veteran care | Admitting: Physical Therapy

## 2018-07-19 ENCOUNTER — Other Ambulatory Visit: Payer: Self-pay

## 2018-07-19 ENCOUNTER — Ambulatory Visit: Payer: No Typology Code available for payment source | Admitting: Physical Therapy

## 2018-07-19 DIAGNOSIS — M25512 Pain in left shoulder: Secondary | ICD-10-CM | POA: Diagnosis not present

## 2018-07-19 DIAGNOSIS — M25612 Stiffness of left shoulder, not elsewhere classified: Secondary | ICD-10-CM

## 2018-07-19 DIAGNOSIS — G8929 Other chronic pain: Secondary | ICD-10-CM

## 2018-07-19 NOTE — Therapy (Signed)
[facility] [address] [place], [place], [address] Phone: [phone]   Fax:  [phone]  Physical Therapy Treatment  Patient Details  Name: Chad Hensley MRN: [id number] Date of Birth: Apr 01, 1951 Referring Provider (PT): Alexandrew Julien Girt PA-C.   Encounter Date: 07/19/2018  PT End of Session - 07/19/18 1349    Visit Number  21    Number of Visits  30    Date for PT Re-Evaluation  07/01/18    Authorization Type  progress note every 10th visit    PT Start Time  0100    PT Stop Time  0152    PT Time Calculation (min)  52 min    Activity Tolerance  Patient tolerated treatment well    Behavior During Therapy  [facility] for tasks assessed/performed       No past medical history on file.  No past surgical history on file.  There were no vitals filed for this visit.  Subjective Assessment - 07/19/18 1335    Subjective  Doctor was very pleased and said he couldn't believe how well I was doing.    Pertinent History  HTN, COPD.    Patient Stated Goals  Left left arm without pain.    Currently in Pain?  Yes    Pain Score  3     Pain Location  Shoulder    Pain Orientation  Left    Pain Descriptors / Indicators  Sore    Pain Onset  More than a month ago                       [facility] Adult PT Treatment/Exercise - 07/19/18 0001      Shoulder Exercises: Standing   Other Standing Exercises  4# Biceps curls to fatigue x 2 f/b 2# overhead punches 2 sets to fatigue.      Shoulder Exercises: Pulleys   Other Pulley Exercises  UE Ranger on wall with 2# on left wrist x 5 minutes f/b ball on wall x 5 minutes.      Shoulder Exercises: ROM/Strengthening   UBE (Upper Arm Bike)  10 minutes at 90 RPM's.      Modalities   Modalities  Electrical Stimulation;Moist Heat      Moist Heat Therapy   Number Minutes Moist Heat  20 Minutes    Moist Heat Location  --   Left shoulder.     Programme researcher, broadcasting/film/video  Location  Left shoulder.    Electrical Stimulation Action  IFC    Electrical Stimulation Parameters  80-150 Hz x 20 minutes    Electrical Stimulation Goals  Pain               PT Short Term Goals - 03/16/18 1400      PT SHORT TERM GOAL #1   Title  STG's=LTG's.        PT Long Term Goals - 05/02/18 1200      PT LONG TERM GOAL #1   Title  Independent with a HEP.    Time  8    Period  Weeks    Status  Achieved      PT LONG TERM GOAL #2   Title  Active left shoulder flexion to 145 degrees so the patient can easily reach overhead.    Time  8    Period  Weeks    Status  On-going      PT LONG TERM GOAL #3  Title  Active ER to 70 degrees+ to allow for easily donning/doffing of apparel.    Time  8    Period  Weeks    Status  On-going      PT LONG TERM GOAL #4   Title  Increase ROM so patient is able to reach behind back to L3.    Time  6    Period  Weeks    Status  On-going      PT LONG TERM GOAL #5   Title  Increase left shoulder strength to a solid 4+/5 to increase stability for performance of functional activities.    Time  8    Period  Weeks    Status  On-going      PT LONG TERM GOAL #6   Title  Perform ADL's with pain not > 2-3/10.    Time  6    Period  Weeks    Status  On-going            Plan - 07/19/18 1343    Clinical Impression Statement  Patient making excellent progress toward goals.  He is able to increase resistance of ther ex today.  His doctor was very impressed with his progress.    PT Treatment/Interventions  ADLs/Self Care Home Management;Cryotherapy;Electrical Stimulation;Ultrasound;Therapeutic activities;Therapeutic exercise;Patient/family education;Manual techniques;Vasopneumatic Device    PT Next Visit Plan  06/30/18.Marland KitchenMarland Kitchenpatient is now 22 weeks post-op.Marland KitchenMarland KitchenMaximize left shoulder range of motion, PRE's.  Combo e'stim/U/S.    Consulted and Agree with Plan of Care  Patient       Patient will benefit from skilled therapeutic  intervention in order to improve the following deficits and impairments:  Pain, Decreased activity tolerance, Decreased range of motion  Visit Diagnosis: Chronic left shoulder pain  Stiffness of left shoulder, not elsewhere classified     Problem List There are no active problems to display for this patient.   APPLEGATE, [place] MPT 07/19/2018, 1:54 PM  [facility] [address] [place], [place], [address] Phone: [phone]   Fax:  [phone]  Name: TREYDON LOZIER MRN: [id number] Date of Birth: 01/17/1951

## 2018-07-21 ENCOUNTER — Other Ambulatory Visit: Payer: Self-pay

## 2018-07-21 ENCOUNTER — Ambulatory Visit: Payer: No Typology Code available for payment source | Admitting: Physical Therapy

## 2018-07-21 ENCOUNTER — Encounter: Payer: Self-pay | Admitting: Physical Therapy

## 2018-07-21 DIAGNOSIS — M25512 Pain in left shoulder: Principal | ICD-10-CM

## 2018-07-21 DIAGNOSIS — M25612 Stiffness of left shoulder, not elsewhere classified: Secondary | ICD-10-CM

## 2018-07-21 DIAGNOSIS — G8929 Other chronic pain: Secondary | ICD-10-CM

## 2018-07-21 NOTE — Therapy (Signed)
[facility] [address] [place], [place], [address] Phone: [phone]   Fax:  [phone]  Physical Therapy Treatment  Patient Details  Name: Chad Hensley MRN: [id number] Date of Birth: [birth year] Referring Provider (PT): Alexandrew Julien Girt PA-C.   Encounter Date: 07/21/2018  PT End of Session - 07/21/18 1317    Visit Number  22    Number of Visits  30    Date for PT Re-Evaluation  07/01/18    Authorization Type  progress note every 10th visit    PT Start Time  1300    PT Stop Time  1353    PT Time Calculation (min)  53 min    Activity Tolerance  Patient tolerated treatment well    Behavior During Therapy  [facility] for tasks assessed/performed       History reviewed. No pertinent past medical history.  History reviewed. No pertinent surgical history.  There were no vitals filed for this visit.  Subjective Assessment - 07/21/18 1308    Subjective  Patient reports ongoing pain and soreness with shoulder extension but reports improvements overall.    Pertinent History  HTN, COPD.    Patient Stated Goals  Left left arm without pain.    Currently in Pain?  Yes   did not provide number on pain scale   Pain Onset  More than a month ago         [facility] PT Assessment - 07/21/18 0001      Assessment   Medical Diagnosis  Left shoulder RCR.    Referring Provider (PT)  Alexandrew Julien Girt PA-C.    Onset Date/Surgical Date  01/26/18    Hand Dominance  Right                   [facility] Adult PT Treatment/Exercise - 07/21/18 0001      Shoulder Exercises: Prone   Retraction  AROM;20 reps    Extension  AROM;20 reps    Horizontal ABduction 1  AROM;20 reps      Shoulder Exercises: Standing   Flexion  20 reps;Strengthening    Flexion Limitations  to top shelf with mug    ABduction  Strengthening;20 reps    Shoulder ABduction Weight (lbs)  2#    Other Standing Exercises  scaption 2# x20      Shoulder Exercises:  ROM/Strengthening   UBE (Upper Arm Bike)  10 minutes at 90 RPM's.      Modalities   Modalities  Electrical Stimulation;Moist Heat      Moist Heat Therapy   Number Minutes Moist Heat  15 Minutes    Moist Heat Location  Shoulder      Electrical Stimulation   Electrical Stimulation Location  left shouldre    Electrical Stimulation Action  IFC    Electrical Stimulation Parameters  80-150 hz x15 mins    Electrical Stimulation Goals  Pain               PT Short Term Goals - 03/16/18 1400      PT SHORT TERM GOAL #1   Title  STG's=LTG's.        PT Long Term Goals - 05/02/18 1200      PT LONG TERM GOAL #1   Title  Independent with a HEP.    Time  8    Period  Weeks    Status  Achieved      PT LONG TERM GOAL #2   Title  Active left  shoulder flexion to 145 degrees so the patient can easily reach overhead.    Time  8    Period  Weeks    Status  On-going      PT LONG TERM GOAL #3   Title  Active ER to 70 degrees+ to allow for easily donning/doffing of apparel.    Time  8    Period  Weeks    Status  On-going      PT LONG TERM GOAL #4   Title  Increase ROM so patient is able to reach behind back to L3.    Time  6    Period  Weeks    Status  On-going      PT LONG TERM GOAL #5   Title  Increase left shoulder strength to a solid 4+/5 to increase stability for performance of functional activities.    Time  8    Period  Weeks    Status  On-going      PT LONG TERM GOAL #6   Title  Perform ADL's with pain not > 2-3/10.    Time  6    Period  Weeks    Status  On-going            Plan - 07/21/18 1317    Clinical Impression Statement  Patient was able to tolerate progression of strengthening well but noted with increased left UT elevation compenastion with fatigue. Patient provided with verbal and tactile cuing for form to which patient demonstrate improved form. No adverse affects noted upon removal of modalities.     Stability/Clinical Decision Making   Stable/Uncomplicated    Clinical Decision Making  Low    Rehab Potential  Good    PT Duration  2 weeks    PT Treatment/Interventions  ADLs/Self Care Home Management;Cryotherapy;Electrical Stimulation;Ultrasound;Therapeutic activities;Therapeutic exercise;Patient/family education;Manual techniques;Vasopneumatic Device    PT Next Visit Plan  06/30/18.Marland KitchenMarland Kitchenpatient is now 22 weeks post-op.Marland KitchenMarland KitchenMaximize left shoulder range of motion, PRE's.  Combo e'stim/U/S.    Consulted and Agree with Plan of Care  Patient       Patient will benefit from skilled therapeutic intervention in order to improve the following deficits and impairments:  Pain, Decreased activity tolerance, Decreased range of motion  Visit Diagnosis: Chronic left shoulder pain  Stiffness of left shoulder, not elsewhere classified     Problem List There are no active problems to display for this patient.  Guss Bunde, PT, DPT 07/21/2018, 2:38 PM  [facility] [address] [place], [place], [address] Phone: [phone]   Fax:  [phone]  Name: Chad Hensley MRN: [id number] Date of Birth: [birth year]

## 2018-07-25 ENCOUNTER — Ambulatory Visit: Payer: No Typology Code available for payment source | Admitting: Physical Therapy

## 2018-07-28 ENCOUNTER — Encounter: Payer: Non-veteran care | Admitting: Physical Therapy

## 2018-08-03 ENCOUNTER — Ambulatory Visit: Payer: No Typology Code available for payment source | Admitting: Physical Therapy

## 2018-08-05 ENCOUNTER — Encounter: Payer: Self-pay | Admitting: Physical Therapy

## 2018-08-05 ENCOUNTER — Ambulatory Visit: Payer: No Typology Code available for payment source | Attending: Orthopedic Surgery | Admitting: Physical Therapy

## 2018-08-05 ENCOUNTER — Other Ambulatory Visit: Payer: Self-pay

## 2018-08-05 DIAGNOSIS — G8929 Other chronic pain: Secondary | ICD-10-CM | POA: Insufficient documentation

## 2018-08-05 DIAGNOSIS — M25512 Pain in left shoulder: Secondary | ICD-10-CM | POA: Insufficient documentation

## 2018-08-05 DIAGNOSIS — M25612 Stiffness of left shoulder, not elsewhere classified: Secondary | ICD-10-CM | POA: Diagnosis present

## 2018-08-05 NOTE — Therapy (Signed)
[facility] [address] [place], [place], [address] Phone: [phone]   Fax:  [phone]  Physical Therapy Treatment  Patient Details  Name: Chad Hensley MRN: [id number] Date of Birth: [birth year] Referring Provider (PT): Alexandrew Julien Girt PA-C.   Encounter Date: 08/05/2018  PT End of Session - 08/05/18 1145    Visit Number  23    Number of Visits  30    Date for PT Re-Evaluation  07/01/18    Authorization Type  progress note every 10th visit    PT Start Time  0947    PT Stop Time  1046    PT Time Calculation (min)  59 min    Activity Tolerance  Patient tolerated treatment well    Behavior During Therapy  [facility] for tasks assessed/performed       History reviewed. No pertinent past medical history.  History reviewed. No pertinent surgical history.  There were no vitals filed for this visit.  Subjective Assessment - 08/05/18 1037    Subjective  I overdid it at home with exercise. COVID-19  screen performed prior to patient entering clinic.     Patient Stated Goals  Left left arm without pain.    Currently in Pain?  Yes    Pain Score  4     Pain Orientation  Left    Pain Type  Surgical pain    Pain Onset  More than a month ago         [facility] PT Assessment - 08/05/18 0001      AROM   AROM Assessment Site  Shoulder    Right/Left Shoulder  Left    Left Shoulder Flexion  131 Degrees                   [facility] Adult PT Treatment/Exercise - 08/05/18 0001      Exercises   Exercises  Shoulder      Shoulder Exercises: ROM/Strengthening   UBE (Upper Arm Bike)  8 minutes at 120 RPM's      Modalities   Modalities  Electrical Stimulation;Moist Heat;Ultrasound      Moist Heat Therapy   Number Minutes Moist Heat  20 Minutes    Moist Heat Location  --   Left shoulder.     Programme researcher, broadcasting/film/video Location  Left shoulder.    Electrical Stimulation Action  IFC    Electrical Stimulation  Parameters  80-150 at 100% scan x 20 minutes.      Ultrasound   Ultrasound Location  Left UT region.    Ultrasound Parameters  Combo e'stim/U/S at 1.50 W/CM2 x 10 minutes.    Ultrasound Goals  Pain      Manual Therapy   Manual Therapy  Soft tissue mobilization    Soft tissue mobilization  STW/M TP/ischemic release technique x 12 minutes to left UT, levator Scapulae and mid-portion of left Supraspinatus               PT Short Term Goals - 03/16/18 1400      PT SHORT TERM GOAL #1   Title  STG's=LTG's.        PT Long Term Goals - 05/02/18 1200      PT LONG TERM GOAL #1   Title  Independent with a HEP.    Time  8    Period  Weeks    Status  Achieved      PT LONG TERM GOAL #2   Title  Active left shoulder flexion to 145 degrees so the patient can easily reach overhead.    Time  8    Period  Weeks    Status  On-going      PT LONG TERM GOAL #3   Title  Active ER to 70 degrees+ to allow for easily donning/doffing of apparel.    Time  8    Period  Weeks    Status  On-going      PT LONG TERM GOAL #4   Title  Increase ROM so patient is able to reach behind back to L3.    Time  6    Period  Weeks    Status  On-going      PT LONG TERM GOAL #5   Title  Increase left shoulder strength to a solid 4+/5 to increase stability for performance of functional activities.    Time  8    Period  Weeks    Status  On-going      PT LONG TERM GOAL #6   Title  Perform ADL's with pain not > 2-3/10.    Time  6    Period  Weeks    Status  On-going            Plan - 08/05/18 1047    Clinical Impression Statement  The patient overdid it at home doing exercises.  He had triggers points in his left UT/Lev scap/supraspinatus.  He responded well to STW/M and felt much better after treatment.  His active antigravity shoulder elevation was 131 degrees today.    PT Treatment/Interventions  ADLs/Self Care Home Management;Cryotherapy;Electrical Stimulation;Ultrasound;Therapeutic  activities;Therapeutic exercise;Patient/family education;Manual techniques;Vasopneumatic Device    Consulted and Agree with Plan of Care  Patient       Patient will benefit from skilled therapeutic intervention in order to improve the following deficits and impairments:     Visit Diagnosis: Chronic left shoulder pain  Stiffness of left shoulder, not elsewhere classified     Problem List There are no active problems to display for this patient.   Kariss Longmire, [place]  MPT 08/05/2018, 11:47 AM  [facility] [address] [place], [place], [address] Phone: [phone]   Fax:  [phone]  Name: Chad Hensley MRN: [id number] Date of Birth: [birth year]

## 2018-08-08 ENCOUNTER — Ambulatory Visit: Payer: No Typology Code available for payment source | Admitting: Physical Therapy

## 2018-08-11 ENCOUNTER — Other Ambulatory Visit: Payer: Self-pay

## 2018-08-11 ENCOUNTER — Ambulatory Visit: Payer: No Typology Code available for payment source | Admitting: Physical Therapy

## 2018-08-11 DIAGNOSIS — M25612 Stiffness of left shoulder, not elsewhere classified: Secondary | ICD-10-CM

## 2018-08-11 DIAGNOSIS — G8929 Other chronic pain: Secondary | ICD-10-CM

## 2018-08-11 DIAGNOSIS — M25512 Pain in left shoulder: Secondary | ICD-10-CM | POA: Diagnosis not present

## 2018-08-11 NOTE — Therapy (Signed)
[facility] [address] [place], [place], [address] Phone: [phone]   Fax:  [phone]  Physical Therapy Treatment  Patient Details  Name: Chad Hensley MRN: [id number] Date of Birth: [birth year] Referring Provider (PT): Alexandrew Julien Girt PA-C.   Encounter Date: 08/11/2018  PT End of Session - 08/11/18 1013    Visit Number  24    Number of Visits  30    Date for PT Re-Evaluation  09/02/18    Authorization Type  progress note every 10th visit    PT Start Time  0945    PT Stop Time  1048    PT Time Calculation (min)  63 min    Activity Tolerance  Patient tolerated treatment well    Behavior During Therapy  [facility] for tasks assessed/performed       No past medical history on file.  No past surgical history on file.  There were no vitals filed for this visit.  Subjective Assessment - 08/11/18 1014    Subjective  COVID-19  screen performed prior to patient entering clinic.  Pains about a 2 today.  Still have that tight muscle in neck.    Currently in Pain?  Yes    Pain Score  2     Pain Orientation  Left    Pain Descriptors / Indicators  Sore    Pain Type  Surgical pain    Pain Onset  More than a month ago                       [facility] Adult PT Treatment/Exercise - 08/11/18 0001      Exercises   Exercises  Shoulder      Shoulder Exercises: Standing   Other Standing Exercises  Ball on wall x 3 minutes f/b wall clocks with 2# ball x 3 minutes f/b 3# full can two sets to fatigue.      Shoulder Exercises: ROM/Strengthening   UBE (Upper Arm Bike)  10 minutes (5 mins forward and 5 mins backward      Modalities   Modalities  Electrical Stimulation;Moist Heat      Moist Heat Therapy   Number Minutes Moist Heat  20 Minutes    Moist Heat Location  --   Lect shoulder/UT.     Programme researcher, broadcasting/film/video Location  Left shoulder.    Electrical Stimulation Action  IFC    Electrical Stimulation  Parameters  80-150 Hz at 40% scan x 20 minutes.      Ultrasound   Ultrasound Location  Left UT region.    Ultrasound Parameters  U/S at 1.50 W/CM2 x 7 minutes.      Manual Therapy   Manual Therapy  Soft tissue mobilization    Soft tissue mobilization  STW/M x 8 minutes to left UT/Lev scap with ichemic release technique utilized.               PT Short Term Goals - 03/16/18 1400      PT SHORT TERM GOAL #1   Title  STG's=LTG's.        PT Long Term Goals - 05/02/18 1200      PT LONG TERM GOAL #1   Title  Independent with a HEP.    Time  8    Period  Weeks    Status  Achieved      PT LONG TERM GOAL #2   Title  Active left shoulder flexion to 145  degrees so the patient can easily reach overhead.    Time  8    Period  Weeks    Status  On-going      PT LONG TERM GOAL #3   Title  Active ER to 70 degrees+ to allow for easily donning/doffing of apparel.    Time  8    Period  Weeks    Status  On-going      PT LONG TERM GOAL #4   Title  Increase ROM so patient is able to reach behind back to L3.    Time  6    Period  Weeks    Status  On-going      PT LONG TERM GOAL #5   Title  Increase left shoulder strength to a solid 4+/5 to increase stability for performance of functional activities.    Time  8    Period  Weeks    Status  On-going      PT LONG TERM GOAL #6   Title  Perform ADL's with pain not > 2-3/10.    Time  6    Period  Weeks    Status  On-going            Plan - 08/11/18 1050    Clinical Impression Statement  Patient very pleased with his progress.  He demonstrates almost equal bilateral shoulder elevation now.  He making excellent progress with PRE's.    PT Treatment/Interventions  ADLs/Self Care Home Management;Cryotherapy;Electrical Stimulation;Ultrasound;Therapeutic activities;Therapeutic exercise;Patient/family education;Manual techniques;Vasopneumatic Device    PT Next Visit Plan  06/30/18.Marland Kitchen.Marland Kitchen.patient is now 22 weeks post-op.Marland Kitchen.Marland Kitchen.Maximize left  shoulder range of motion, PRE's.  Combo e'stim/U/S.    Consulted and Agree with Plan of Care  Patient       Patient will benefit from skilled therapeutic intervention in order to improve the following deficits and impairments:  Pain, Decreased activity tolerance, Decreased range of motion  Visit Diagnosis: Chronic left shoulder pain - Plan: PT plan of care cert/re-cert  Stiffness of left shoulder, not elsewhere classified - Plan: PT plan of care cert/re-cert     Problem List There are no active problems to display for this patient.   Ausencio Vaden, [place] MPT 08/11/2018, 11:31 AM  [facility] [address] [place], [place], [address] Phone: [phone]   Fax:  [phone]  Name: Chad Hensley MRN: [id number] Date of Birth: [birth year]

## 2018-08-19 ENCOUNTER — Other Ambulatory Visit: Payer: Self-pay

## 2018-08-19 ENCOUNTER — Ambulatory Visit: Payer: No Typology Code available for payment source | Admitting: Physical Therapy

## 2018-08-19 DIAGNOSIS — M25512 Pain in left shoulder: Secondary | ICD-10-CM | POA: Diagnosis not present

## 2018-08-19 DIAGNOSIS — M25612 Stiffness of left shoulder, not elsewhere classified: Secondary | ICD-10-CM

## 2018-08-19 DIAGNOSIS — G8929 Other chronic pain: Secondary | ICD-10-CM

## 2018-08-19 NOTE — Therapy (Signed)
[facility] [address] [place], [place], [address] Phone: [phone]   Fax:  [phone]  Physical Therapy Treatment  Patient Details  Name: Chad Hensley MRN: [id number] Date of Birth: July 13, 1950 Referring Provider (PT): Alexandrew Julien Girt PA-C.   Encounter Date: 08/19/2018  PT End of Session - 08/19/18 1050    Visit Number  25    Number of Visits  30    Date for PT Re-Evaluation  09/02/18    Authorization Type  progress note every 10th visit    PT Start Time  0946    PT Stop Time  1041    PT Time Calculation (min)  55 min       No past medical history on file.  No past surgical history on file.  There were no vitals filed for this visit.  Subjective Assessment - 08/19/18 1053    Subjective  COVID-19  screen performed prior to patient entering clinic.      Patient Stated Goals  Left left arm without pain.    Currently in Pain?  Yes    Pain Score  2     Pain Location  Shoulder    Pain Orientation  Left    Pain Descriptors / Indicators  Sore    Pain Type  Surgical pain    Pain Onset  More than a month ago         [facility] PT Assessment - 08/19/18 0001      AROM   AROM Assessment Site  --   Right shoulder.   Right/Left Shoulder  Left    Left Shoulder Flexion  --   150 degrees of left shoulder flexion in supine.                  [facility] Adult PT Treatment/Exercise - 08/19/18 0001      Exercises   Exercises  Shoulder      Shoulder Exercises: Supine   Other Supine Exercises  Low load low duration stretch x 8 minutes into left shoulder ER.      Shoulder Exercises: Standing   Other Standing Exercises  Orange XTS pulldowns into scapular retraction f/b forward punches and wall push-up all performed to fatigue x 2.      Shoulder Exercises: ROM/Strengthening   UBE (Upper Arm Bike)  10 minutes      Modalities   Modalities  Electrical Stimulation;Moist Heat      Moist Heat Therapy   Number Minutes Moist  Heat  20 Minutes    Moist Heat Location  --   Left shoulder.     Programme researcher, broadcasting/film/video Location  Left shoulder.    Electrical Stimulation Action  IFC    Electrical Stimulation Parameters  80-150 Hz x 20 minutes.    Electrical Stimulation Goals  Tone;Pain               PT Short Term Goals - 03/16/18 1400      PT SHORT TERM GOAL #1   Title  STG's=LTG's.        PT Long Term Goals - 05/02/18 1200      PT LONG TERM GOAL #1   Title  Independent with a HEP.    Time  8    Period  Weeks    Status  Achieved      PT LONG TERM GOAL #2   Title  Active left shoulder flexion to 145 degrees so the patient can easily reach  overhead.    Time  8    Period  Weeks    Status  On-going      PT LONG TERM GOAL #3   Title  Active ER to 70 degrees+ to allow for easily donning/doffing of apparel.    Time  8    Period  Weeks    Status  On-going      PT LONG TERM GOAL #4   Title  Increase ROM so patient is able to reach behind back to L3.    Time  6    Period  Weeks    Status  On-going      PT LONG TERM GOAL #5   Title  Increase left shoulder strength to a solid 4+/5 to increase stability for performance of functional activities.    Time  8    Period  Weeks    Status  On-going      PT LONG TERM GOAL #6   Title  Perform ADL's with pain not > 2-3/10.    Time  6    Period  Weeks    Status  On-going            Plan - 08/19/18 1104    Clinical Impression Statement  The patient's left shoulder active flexion in supine was 150 degrees.    PT Treatment/Interventions  ADLs/Self Care Home Management;Cryotherapy;Electrical Stimulation;Ultrasound;Therapeutic activities;Therapeutic exercise;Patient/family education;Manual techniques;Vasopneumatic Device    PT Next Visit Plan  06/30/18.Marland Kitchen.Marland Kitchen.patient is now 22 weeks post-op.Marland Kitchen.Marland Kitchen.Maximize left shoulder range of motion, PRE's.  Combo e'stim/U/S.    Consulted and Agree with Plan of Care  Patient       Patient will  benefit from skilled therapeutic intervention in order to improve the following deficits and impairments:  Pain, Decreased activity tolerance, Decreased range of motion  Visit Diagnosis: Chronic left shoulder pain  Stiffness of left shoulder, not elsewhere classified     Problem List There are no active problems to display for this patient.   Markan Cazarez, [place]  MPT 08/19/2018, 11:10 AM  [facility] [address] [place], [place], [address] Phone: [phone]   Fax:  [phone]  Name: Chad Hensley MRN: [id number] Date of Birth: 1950-07-02

## 2018-08-23 ENCOUNTER — Other Ambulatory Visit: Payer: Self-pay

## 2018-08-23 ENCOUNTER — Ambulatory Visit: Payer: No Typology Code available for payment source | Admitting: Physical Therapy

## 2018-08-23 DIAGNOSIS — M25512 Pain in left shoulder: Secondary | ICD-10-CM | POA: Diagnosis not present

## 2018-08-23 DIAGNOSIS — M25612 Stiffness of left shoulder, not elsewhere classified: Secondary | ICD-10-CM

## 2018-08-23 DIAGNOSIS — G8929 Other chronic pain: Secondary | ICD-10-CM

## 2018-08-23 NOTE — Therapy (Addendum)
[facility] [address] [place], [place], [address] Phone: [phone]   Fax:  [phone]  Physical Therapy Treatment  Patient Details  Name: Chad Hensley MRN: [id number] Date of Birth: 16-Jul-1950 Referring Provider (PT): Alexandrew Julien Girt PA-C.   Encounter Date: 08/23/2018  PT End of Session - 08/23/18 1203    Visit Number  26    Number of Visits  30    Date for PT Re-Evaluation  09/02/18    Authorization Type  progress note every 10th visit    PT Start Time  0945    PT Stop Time  1039    PT Time Calculation (min)  54 min    Activity Tolerance  Patient tolerated treatment well    Behavior During Therapy  [facility] for tasks assessed/performed       No past medical history on file.  No past surgical history on file.  There were no vitals filed for this visit.  Subjective Assessment - 08/23/18 1103    Subjective  COVID-19  screen performed prior to patient entering clinic.  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    Pertinent History  HTN, COPD.    Patient Stated Goals  Left left arm without pain.    Currently in Pain?  Yes    Pain Score  2     Pain Orientation  Left    Pain Descriptors / Indicators  Sore    Pain Type  Surgical pain    Pain Onset  More than a month ago                       [facility] Adult PT Treatment/Exercise - 08/23/18 0001      Exercises   Exercises  Shoulder      Shoulder Exercises: ROM/Strengthening   UBE (Upper Arm Bike)  10 minutes at 90 RPM's      Modalities   Modalities  Electrical Stimulation;Moist Heat;Ultrasound      Moist Heat Therapy   Number Minutes Moist Heat  20 Minutes    Moist Heat Location  --   Left shoulder.     Programme researcher, broadcasting/film/video Location  Left shoulder.    Electrical Stimulation Action  IFC    Electrical Stimulation Parameters  80-150 Hz x 20 minutes.    Electrical Stimulation Goals  Tone;Pain      Ultrasound   Ultrasound Location   Left anterior shoulder    Ultrasound Parameters  Combo e'stim/U/S at 1.50 W/CM2 x 10 minutes while performing a manual sustained left shoulder stretch into ER               PT Short Term Goals - 03/16/18 1400      PT SHORT TERM GOAL #1   Title  STG's=LTG's.        PT Long Term Goals - 05/02/18 1200      PT LONG TERM GOAL #1   Title  Independent with a HEP.    Time  8    Period  Weeks    Status  Achieved      PT LONG TERM GOAL #2   Title  Active left shoulder flexion to 145 degrees so the patient can easily reach overhead.    Time  8    Period  Weeks    Status  On-going      PT LONG TERM GOAL #3   Title  Active ER to 70 degrees+ to allow for  easily donning/doffing of apparel.    Time  8    Period  Weeks    Status  On-going      PT LONG TERM GOAL #4   Title  Increase ROM so patient is able to reach behind back to L3.    Time  6    Period  Weeks    Status  On-going      PT LONG TERM GOAL #5   Title  Increase left shoulder strength to a solid 4+/5 to increase stability for performance of functional activities.    Time  8    Period  Weeks    Status  On-going      PT LONG TERM GOAL #6   Title  Perform ADL's with pain not > 2-3/10.    Time  6    Period  Weeks    Status  On-going            Plan - 08/23/18 1158    Clinical Impression Statement  Focused on left shoulder ER today. He did very well with stretching today.    PT Treatment/Interventions  ADLs/Self Care Home Management;Cryotherapy;Electrical Stimulation;Ultrasound;Therapeutic activities;Therapeutic exercise;Patient/family education;Manual techniques;Vasopneumatic Device    PT Next Visit Plan  06/30/18.Marland Kitchen.Marland Kitchen.patient is now 22 weeks post-op.Marland Kitchen.Marland Kitchen.Maximize left shoulder range of motion, PRE's.  Combo e'stim/U/S.    Consulted and Agree with Plan of Care  Patient       Patient will benefit from skilled therapeutic intervention in order to improve the following deficits and impairments:  Pain, Decreased  activity tolerance, Decreased range of motion  Visit Diagnosis: Chronic left shoulder pain  Stiffness of left shoulder, not elsewhere classified     Problem List There are no active problems to display for this patient.   Victoriano Campion, [place] MPT 08/23/2018, 12:36 PM  [facility] [address] [place], [place], [address] Phone: [phone]   Fax:  [phone]  Name: Chad Hensley MRN: [id number] Date of Birth: Mar 23, 1951

## 2018-08-25 ENCOUNTER — Other Ambulatory Visit: Payer: Self-pay

## 2018-08-25 ENCOUNTER — Ambulatory Visit: Payer: No Typology Code available for payment source | Admitting: Physical Therapy

## 2018-08-25 DIAGNOSIS — M25512 Pain in left shoulder: Secondary | ICD-10-CM | POA: Diagnosis not present

## 2018-08-25 DIAGNOSIS — G8929 Other chronic pain: Secondary | ICD-10-CM

## 2018-08-25 DIAGNOSIS — M25612 Stiffness of left shoulder, not elsewhere classified: Secondary | ICD-10-CM

## 2018-08-25 NOTE — Therapy (Signed)
[facility] [address] [place], [place], [address] Phone: [phone]   Fax:  [phone]  Physical Therapy Treatment  Patient Details  Name: Chad Hensley MRN: [id number] Date of Birth: 29-May-1950 Referring Provider (PT): Alexandrew Julien Girt PA-C.   Encounter Date: 08/25/2018  PT End of Session - 08/25/18 1156    Visit Number  27    Number of Visits  30    Date for PT Re-Evaluation  09/02/18    Authorization Type  progress note every 10th visit    PT Start Time  1115    PT Stop Time  1206    PT Time Calculation (min)  51 min    Activity Tolerance  Patient tolerated treatment well    Behavior During Therapy  [facility] for tasks assessed/performed       No past medical history on file.  No past surgical history on file.  There were no vitals filed for this visit.  Subjective Assessment - 08/25/18 1153    Subjective  COVID-19 screen performed prior to patient entering clinic.  Have a pretty bad headache, would like to avoid exercise.    Pertinent History  HTN, COPD.    Patient Stated Goals  Left left arm without pain.    Currently in Pain?  Yes    Pain Score  2     Pain Orientation  Left    Pain Descriptors / Indicators  Sore    Pain Onset  More than a month ago                       [facility] Adult PT Treatment/Exercise - 08/25/18 0001      Moist Heat Therapy   Number Minutes Moist Heat  20 Minutes    Moist Heat Location  --   Left shoulder.     Programme researcher, broadcasting/film/video Location  Left shoulder.    Electrical Stimulation Action  IFC    Electrical Stimulation Parameters  80-150 Hz x 20 minutes.    Electrical Stimulation Goals  Tone;Pain      Manual Therapy   Passive ROM  Sustained left shoulder ER stretch in plane of scapula while performing combo e'stim/U/S at 1.50 W/CM2 x 10 minutes f/b a sustained left shoulder flexion stretch (low load long duration) x 3 minutes.                PT Short Term Goals - 03/16/18 1400      PT SHORT TERM GOAL #1   Title  STG's=LTG's.        PT Long Term Goals - 05/02/18 1200      PT LONG TERM GOAL #1   Title  Independent with a HEP.    Time  8    Period  Weeks    Status  Achieved      PT LONG TERM GOAL #2   Title  Active left shoulder flexion to 145 degrees so the patient can easily reach overhead.    Time  8    Period  Weeks    Status  On-going      PT LONG TERM GOAL #3   Title  Active ER to 70 degrees+ to allow for easily donning/doffing of apparel.    Time  8    Period  Weeks    Status  On-going      PT LONG TERM GOAL #4   Title  Increase ROM so patient is  able to reach behind back to L3.    Time  6    Period  Weeks    Status  On-going      PT LONG TERM GOAL #5   Title  Increase left shoulder strength to a solid 4+/5 to increase stability for performance of functional activities.    Time  8    Period  Weeks    Status  On-going      PT LONG TERM GOAL #6   Title  Perform ADL's with pain not > 2-3/10.    Time  6    Period  Weeks    Status  On-going            Plan - 08/25/18 1159    Clinical Impression Statement  Avoid ther ex today as patient had a bad headache.  Focused on ER stretching as this is the motion he is lacking the most.      PT Treatment/Interventions  ADLs/Self Care Home Management;Cryotherapy;Electrical Stimulation;Ultrasound;Therapeutic activities;Therapeutic exercise;Patient/family education;Manual techniques;Vasopneumatic Device    PT Next Visit Plan  06/30/18.Marland Kitchen.Marland Kitchen.patient is now 22 weeks post-op.Marland Kitchen.Marland Kitchen.Maximize left shoulder range of motion, PRE's.  Combo e'stim/U/S.    Consulted and Agree with Plan of Care  Patient       Patient will benefit from skilled therapeutic intervention in order to improve the following deficits and impairments:  Pain, Decreased activity tolerance, Decreased range of motion  Visit Diagnosis: Chronic left shoulder pain  Stiffness of left  shoulder, not elsewhere classified     Problem List There are no active problems to display for this patient.   Amora Sheehy, [place]  MPT 08/25/2018, 12:19 PM  [facility] [address] [place], [place], [id number] Phone: [phone]   Fax:  [phone]  Name: Chad Hensley MRN: [id number] Date of Birth: 12-10-1950

## 2018-08-30 ENCOUNTER — Other Ambulatory Visit: Payer: Self-pay

## 2018-08-30 ENCOUNTER — Ambulatory Visit: Payer: No Typology Code available for payment source | Admitting: Physical Therapy

## 2018-08-30 ENCOUNTER — Encounter: Payer: Self-pay | Admitting: Physical Therapy

## 2018-08-30 DIAGNOSIS — M25512 Pain in left shoulder: Secondary | ICD-10-CM | POA: Diagnosis not present

## 2018-08-30 DIAGNOSIS — G8929 Other chronic pain: Secondary | ICD-10-CM

## 2018-08-30 DIAGNOSIS — M25612 Stiffness of left shoulder, not elsewhere classified: Secondary | ICD-10-CM

## 2018-08-30 NOTE — Therapy (Signed)
Chad Hensley [address] [place], [place], [address] Phone: [phone]   Fax:  [phone]  Physical Therapy Treatment  Patient Details  Name: Chad Hensley MRN: [id number] Date of Birth: Jun 20, 1950 Referring Provider (Chad Hensley): Chad Julien Girt PA-C.   Encounter Date: 08/30/2018  Chad Hensley End of Session - 08/30/18 1505    Visit Number  28    Number of Visits  30    Date for Chad Hensley Re-Evaluation  09/02/18    Authorization Type  progress note every 10th visit    Chad Hensley Start Time  1423    Chad Hensley Stop Time  1519    Chad Hensley Time Calculation (min)  56 min    Activity Tolerance  Patient tolerated treatment well    Behavior During Therapy  [facility] for tasks assessed/performed       History reviewed. No pertinent past medical history.  History reviewed. No pertinent surgical history.  There were no vitals filed for this visit.  Subjective Assessment - 08/30/18 1503    Subjective  COVID-19 screening performed prior to patient entering the building. Patient reported feeling a little tired from allergies.     Pertinent History  HTN, COPD.    Patient Stated Goals  Left left arm without pain.    Currently in Pain?  Yes   "minimal"        [facility] Chad Hensley Assessment - 08/30/18 0001      Assessment   Medical Diagnosis  Left shoulder RCR.    Referring Provider (Chad Hensley)  Chad Julien Girt PA-C.    Onset Date/Surgical Date  01/26/18    Hand Dominance  Right    Next MD Visit  To be determined                   [facility] Adult Chad Hensley Treatment/Exercise - 08/30/18 0001      Shoulder Exercises: Standing   Protraction  Strengthening;20 reps    Theraband Level (Shoulder Protraction)  Level 2 (Red)    External Rotation  Strengthening;Left;20 reps;Theraband    Theraband Level (Shoulder External Rotation)  Level 2 (Red)    Internal Rotation  Strengthening;Left;20 reps;Theraband    Theraband Level (Shoulder Internal Rotation)  Level 2 (Red)    ABduction  20 reps;AROM    Extension  Strengthening;Left;20 reps    Theraband Level (Shoulder Extension)  Level 2 (Red)    Row  Strengthening;Left;20 reps    Theraband Level (Shoulder Row)  Level 2 (Red)      Shoulder Exercises: ROM/Strengthening   UBE (Upper Arm Bike)  10 minutes at 90 RPM's    Wall Pushups  20 reps    X to V Arms  x20      Moist Heat Therapy   Number Minutes Moist Heat  15 Minutes    Moist Heat Location  Shoulder      Electrical Stimulation   Electrical Stimulation Location  Left shoulder.    Electrical Stimulation Action  IFC    Electrical Stimulation Parameters  80-150 hz x15 mins    Electrical Stimulation Goals  Tone;Pain               Chad Hensley Short Term Goals - 03/16/18 1400      Chad Hensley SHORT TERM GOAL #1   Title  STG's=LTG's.        Chad Hensley Long Term Goals - 05/02/18 1200      Chad Hensley LONG TERM GOAL #1   Title  Independent with a HEP.    Time  8  Period  Weeks    Status  Achieved      Chad Hensley LONG TERM GOAL #2   Title  Active left shoulder flexion to 145 degrees so the patient can easily reach overhead.    Time  8    Period  Weeks    Status  On-going      Chad Hensley LONG TERM GOAL #3   Title  Active ER to 70 degrees+ to allow for easily donning/doffing of apparel.    Time  8    Period  Weeks    Status  On-going      Chad Hensley LONG TERM GOAL #4   Title  Increase ROM so patient is able to reach behind back to L3.    Time  6    Period  Weeks    Status  On-going      Chad Hensley LONG TERM GOAL #5   Title  Increase left shoulder strength to a solid 4+/5 to increase stability for performance of functional activities.    Time  8    Period  Weeks    Status  On-going      Chad Hensley LONG TERM GOAL #6   Title  Perform ADL's with pain not > 2-3/10.    Time  6    Period  Weeks    Status  On-going            Plan - 08/30/18 1512    Clinical Impression Statement  Patient was able to tolerate treatment well and focused on shoulder strengthening today. Patient reported most difficulties with shoulder  abduction. Patient instructed to continue HEP to maintain gains as well as improve AROM.      Stability/Clinical Decision Making  Stable/Uncomplicated    Clinical Decision Making  Low    Rehab Potential  Good    Chad Hensley Duration  2 weeks    Chad Hensley Treatment/Interventions  ADLs/Self Care Home Management;Cryotherapy;Electrical Stimulation;Ultrasound;Therapeutic activities;Therapeutic exercise;Patient/family education;Manual techniques;Vasopneumatic Device    Chad Hensley Next Visit Plan  Assess goals next visit. Maximize left shoulder range of motion, PRE's.  Combo e'stim/U/S.    Consulted and Agree with Plan of Care  Patient       Patient will benefit from skilled therapeutic intervention in order to improve the following deficits and impairments:  Pain, Decreased activity tolerance, Decreased range of motion  Visit Diagnosis: Chronic left shoulder pain  Stiffness of left shoulder, not elsewhere classified     Problem List There are no active problems to display for this patient.  Chad Hensley, Chad Hensley, Chad Hensley 08/30/2018, 3:34 PM  [facility] [address] [place], [place], [address] Phone: [phone]   Fax:  [phone]  Name: Chad Hensley MRN: [id number] Date of Birth: 1950/07/26

## 2018-09-01 ENCOUNTER — Encounter: Payer: Self-pay | Admitting: Physical Therapy

## 2018-09-01 ENCOUNTER — Ambulatory Visit: Payer: No Typology Code available for payment source | Admitting: Physical Therapy

## 2018-09-01 ENCOUNTER — Other Ambulatory Visit: Payer: Self-pay

## 2018-09-01 DIAGNOSIS — M25512 Pain in left shoulder: Principal | ICD-10-CM

## 2018-09-01 DIAGNOSIS — M25612 Stiffness of left shoulder, not elsewhere classified: Secondary | ICD-10-CM

## 2018-09-01 DIAGNOSIS — G8929 Other chronic pain: Secondary | ICD-10-CM

## 2018-09-01 NOTE — Therapy (Signed)
[facility] [place], [place], [address] Phone: [phone]   Fax:  [phone]  Physical Therapy Treatment  Patient Details  Name: Chad Hensley MRN: [id number] Date of Birth: [birth year] Referring Provider (PT): Alexandrew Dara Lords PA-C.   Encounter Date: 09/01/2018  PT End of Session - 09/01/18 1122    Visit Number  29    Number of Visits  30    Date for PT Re-Evaluation  09/02/18    Authorization Type  progress note every 10th visit    PT Start Time  1112    PT Stop Time  1212    PT Time Calculation (min)  60 min    Activity Tolerance  Patient tolerated treatment well    Behavior During Therapy  [facility] for tasks assessed/performed       History reviewed. No pertinent past medical history.  History reviewed. No pertinent surgical history.  There were no vitals filed for this visit.  Subjective Assessment - 09/01/18 1121    Subjective  COVID-19 screening performed prior to patient entering the building. Patient reported feeling good after last session but allergies are bothering him more today.     Pertinent History  HTN, COPD.    Currently in Pain?  Yes   "a little bit"        [facility] PT Assessment - 09/01/18 0001      Assessment   Medical Diagnosis  Left shoulder RCR.    Referring Provider (PT)  Alexandrew Dara Lords PA-C.    Onset Date/Surgical Date  01/26/18    Hand Dominance  Right    Next MD Visit  To be determined      AROM   Left Shoulder Flexion  150 Degrees   supine   Left Shoulder ABduction  110 Degrees    Left Shoulder External Rotation  60 Degrees                   [facility] Adult PT Treatment/Exercise - 09/01/18 0001      Shoulder Exercises: Standing   Row  Strengthening;Left;20 reps    Row Limitations  Pink XTS    Diagonals  Strengthening;Both;20 reps    Diagonals Limitations  woodchops Pink XTS      Shoulder Exercises: ROM/Strengthening   UBE (Upper Arm Bike)  10 minutes at 90  RPM's      Moist Heat Therapy   Number Minutes Moist Heat  15 Minutes    Moist Heat Location  Shoulder      Electrical Stimulation   Electrical Stimulation Location  Left shoulder.    Electrical Stimulation Action  IFC    Electrical Stimulation Parameters  80-150 hz x15 mins    Electrical Stimulation Goals  Tone;Pain      Manual Therapy   Manual Therapy  Passive ROM    Passive ROM  PROM to left shoulder in all planes, PNF D2 flexion with resistance               PT Short Term Goals - 03/16/18 1400      PT SHORT TERM GOAL #1   Title  STG's=LTG's.        PT Long Term Goals - 09/01/18 1143      PT LONG TERM GOAL #1   Title  Independent with a HEP.    Time  8    Period  Weeks    Status  Achieved      PT LONG TERM GOAL #2  Title  Active left shoulder flexion to 145 degrees so the patient can easily reach overhead.    Time  8    Period  Weeks    Status  Achieved      PT LONG TERM GOAL #3   Title  Active ER to 70 degrees+ to allow for easily donning/doffing of apparel.    Time  8    Period  Weeks    Status  Not Met      PT LONG TERM GOAL #4   Title  Increase ROM so patient is able to reach behind back to L3.    Time  6    Period  Weeks    Status  Achieved      PT LONG TERM GOAL #5   Title  Increase left shoulder strength to a solid 4+/5 to increase stability for performance of functional activities.    Time  8    Period  Weeks    Status  Partially Met      PT LONG TERM GOAL #6   Title  Perform ADL's with pain not > 2-3/10.    Time  6    Period  Weeks    Status  Achieved            Plan - 09/01/18 1235    Clinical Impression Statement  Patient was able to tolerate treatment well but still reports ongoing inner biceps pain. Patient and PT discussed plan after therapy and the importance of continuing exercise. Patient reported understanding. Patient's goals are partially met. DC next visit.     Stability/Clinical Decision Making   Stable/Uncomplicated    Clinical Decision Making  Low    Rehab Potential  Good    PT Duration  2 weeks    PT Treatment/Interventions  ADLs/Self Care Home Management;Cryotherapy;Electrical Stimulation;Ultrasound;Therapeutic activities;Therapeutic exercise;Patient/family education;Manual techniques;Vasopneumatic Device    PT Next Visit Plan  Provide HEP rockwood 4, lat pull downs, wood chops. DC       Patient will benefit from skilled therapeutic intervention in order to improve the following deficits and impairments:  Pain, Decreased activity tolerance, Decreased range of motion  Visit Diagnosis: Chronic left shoulder pain  Stiffness of left shoulder, not elsewhere classified     Problem List There are no active problems to display for this patient.   Gabriela Eves, PT, DPT 09/01/2018, 2:08 PM  [facility] [place], [place], [address] Phone: [phone]   Fax:  [phone]  Name: Chad Hensley MRN: [id number] Date of Birth: 1950-05-09

## 2018-09-06 ENCOUNTER — Other Ambulatory Visit: Payer: Self-pay

## 2018-09-06 ENCOUNTER — Encounter: Payer: Self-pay | Admitting: Physical Therapy

## 2018-09-06 ENCOUNTER — Ambulatory Visit: Payer: No Typology Code available for payment source | Attending: Orthopedic Surgery | Admitting: Physical Therapy

## 2018-09-06 DIAGNOSIS — M25512 Pain in left shoulder: Secondary | ICD-10-CM | POA: Diagnosis present

## 2018-09-06 DIAGNOSIS — M25612 Stiffness of left shoulder, not elsewhere classified: Secondary | ICD-10-CM | POA: Diagnosis present

## 2018-09-06 DIAGNOSIS — G8929 Other chronic pain: Secondary | ICD-10-CM | POA: Diagnosis present

## 2018-09-06 NOTE — Therapy (Signed)
Chad Hens[place], [place], [address] Phone: [phone]   Fax:  [phone]  Physical Therapy Treatment PHYSICAL THERAPY DISCHARGE SUMMARY  Visits from Start of Care: 30  Current functional level related to goals / functional outcomes: See below   Remaining deficits: See goals   Education / Equipment: HEP and resistance bands Plan: Patient agrees to discharge.  Patient goals were partially met. Patient is being discharged due to being pleased with the current functional level.  ?????    Chad Hensley, PT, DPT   Patient Details  Name: Chad Hensley MRN: [id number] Date of Birth: 03-01-1951 Referring Provider (PT): Alexandrew Dara Lords PA-C.   Encounter Date: 09/06/2018  PT End of Session - 09/06/18 1206    Visit Number  30    Number of Visits  30    Date for PT Re-Evaluation  09/02/18    Authorization Type  progress note every 10th visit    PT Start Time  1115    PT Stop Time  1209    PT Time Calculation (min)  54 min    Activity Tolerance  Patient tolerated treatment well    Behavior During Therapy  [facility] for tasks assessed/performed       History reviewed. No pertinent past medical history.  History reviewed. No pertinent surgical history.  There were no vitals filed for this visit.  Subjective Assessment - 09/06/18 1123    Subjective  COVID-19 screening performed prior to patient entering the building. Patient reported no new complaints.    Pertinent History  HTN, COPD.    Patient Stated Goals  Left left arm without pain.    Currently in Pain?  No/denies         [facility] PT Assessment - 09/06/18 0001      Assessment   Medical Diagnosis  Left shoulder RCR.    Referring Provider (PT)  Alexandrew Dara Lords PA-C.    Onset Date/Surgical Date  01/26/18    Hand Dominance  Right    Next MD Visit  To be determined                   [facility] Adult PT Treatment/Exercise - 09/06/18 0001      Shoulder Exercises: Standing   Protraction  Strengthening;20 reps    Theraband Level (Shoulder Protraction)  Level 3 (Green)    External Rotation  Strengthening;Left;20 reps;Theraband    Theraband Level (Shoulder External Rotation)  Level 3 (Green)    Internal Rotation  Strengthening;20 reps;Theraband    Theraband Level (Shoulder Internal Rotation)  Level 3 (Green)    Row  Strengthening;Left;20 reps    Theraband Level (Shoulder Row)  Level 3 (Green)      Shoulder Exercises: ROM/Strengthening   UBE (Upper Arm Bike)  10 minutes at 90 RPM's      Electrical Stimulation   Electrical Stimulation Location  Left shoulder.    Electrical Stimulation Action  IFC    Electrical Stimulation Parameters  80-150 hz x15 mins    Electrical Stimulation Goals  Tone;Pain      Vasopneumatic   Number Minutes Vasopneumatic   15 minutes    Vasopnuematic Location   Shoulder    Vasopneumatic Pressure  Low    Vasopneumatic Temperature   46      Manual Therapy   Manual Therapy  Soft tissue mobilization    Soft tissue mobilization  STW/M to left biceps to decrease pain and trigger points  PT Short Term Goals - 03/16/18 1400      PT SHORT TERM GOAL #1   Title  STG's=LTG's.        PT Long Term Goals - 09/01/18 1143      PT LONG TERM GOAL #1   Title  Independent with a HEP.    Time  8    Period  Weeks    Status  Achieved      PT LONG TERM GOAL #2   Title  Active left shoulder flexion to 145 degrees so the patient can easily reach overhead.    Time  8    Period  Weeks    Status  Achieved      PT LONG TERM GOAL #3   Title  Active ER to 70 degrees+ to allow for easily donning/doffing of apparel.    Time  8    Period  Weeks    Status  Not Met      PT LONG TERM GOAL #4   Title  Increase ROM so patient is able to reach behind back to L3.    Time  6    Period  Weeks    Status  Achieved      PT LONG TERM GOAL #5   Title  Increase left shoulder strength to a solid 4+/5 to  increase stability for performance of functional activities.    Time  8    Period  Weeks    Status  Partially Met      PT LONG TERM GOAL #6   Title  Perform ADL's with pain not > 2-3/10.    Time  6    Period  Weeks    Status  Achieved            Plan - 09/06/18 1231    Clinical Impression Statement  Patient was able to tolerate treatment well. Patient required verbal cuing for form. Patient and PT reviewed HEP as well as balance between strengthening and ROM. Patient reported understanding. Patient to be discharged with goals partially met.     Stability/Clinical Decision Making  Stable/Uncomplicated    Clinical Decision Making  Low    Rehab Potential  Good    PT Duration  2 weeks    PT Treatment/Interventions  ADLs/Self Care Home Management;Cryotherapy;Electrical Stimulation;Ultrasound;Therapeutic activities;Therapeutic exercise;Patient/family education;Manual techniques;Vasopneumatic Device    PT Next Visit Plan  Discharge       Patient will benefit from skilled therapeutic intervention in order to improve the following deficits and impairments:  Pain, Decreased activity tolerance, Decreased range of motion  Visit Diagnosis: Chronic left shoulder pain  Stiffness of left shoulder, not elsewhere classified     Problem List There are no active problems to display for this patient.  Chad Hensley, PT, DPT 09/06/2018, 12:54 PM  [facility] [address] [place], [place], [address] Phone: [phone]   Fax:  [phone]  Name: Chad Hensley MRN: [id number] Date of Birth: Sep 03, 1950

## 2018-09-06 NOTE — Therapy (Signed)
[facility] [place], [place], [address] Phone: [phone]   Fax:  [phone]  Physical Therapy Treatment PHYSICAL THERAPY DISCHARGE SUMMARY  Visits from Start of Care: 30  Current functional level related to goals / functional outcomes: See below   Remaining deficits: See goals   Education / Equipment: HEP and resistance bands Plan: Patient agrees to discharge.  Patient goals were partially met. Patient is being discharged due to being pleased with the current functional level.  ?????   Gabriela Eves, PT, DPT   Patient Details  Name: Chad Hensley MRN: [id number] Date of Birth: 1950/10/20 Referring Provider (PT): Alexandrew Dara Lords PA-C.   Encounter Date: 09/06/2018  PT End of Session - 09/06/18 1206    Visit Number  30    Number of Visits  30    Date for PT Re-Evaluation  09/02/18    Authorization Type  progress note every 10th visit    PT Start Time  1115    PT Stop Time  1209    PT Time Calculation (min)  54 min    Activity Tolerance  Patient tolerated treatment well    Behavior During Therapy  [facility] for tasks assessed/performed       History reviewed. No pertinent past medical history.  History reviewed. No pertinent surgical history.  There were no vitals filed for this visit.  Subjective Assessment - 09/06/18 1123    Subjective  COVID-19 screening performed prior to patient entering the building. Patient reported no new complaints.    Pertinent History  HTN, COPD.    Patient Stated Goals  Left left arm without pain.    Currently in Pain?  No/denies         [facility] PT Assessment - 09/06/18 0001      Assessment   Medical Diagnosis  Left shoulder RCR.    Referring Provider (PT)  Alexandrew Dara Lords PA-C.    Onset Date/Surgical Date  01/26/18    Hand Dominance  Right    Next MD Visit  To be determined                   [facility] Adult PT Treatment/Exercise - 09/06/18 0001      Shoulder Exercises: Standing   Protraction  Strengthening;20 reps    Theraband Level (Shoulder Protraction)  Level 3 (Green)    External Rotation  Strengthening;Left;20 reps;Theraband    Theraband Level (Shoulder External Rotation)  Level 3 (Green)    Internal Rotation  Strengthening;20 reps;Theraband    Theraband Level (Shoulder Internal Rotation)  Level 3 (Green)    Row  Strengthening;Left;20 reps    Theraband Level (Shoulder Row)  Level 3 (Green)      Shoulder Exercises: ROM/Strengthening   UBE (Upper Arm Bike)  10 minutes at 90 RPM's      Electrical Stimulation   Electrical Stimulation Location  Left shoulder.    Electrical Stimulation Action  IFC    Electrical Stimulation Parameters  80-150 hz x15 mins    Electrical Stimulation Goals  Tone;Pain      Vasopneumatic   Number Minutes Vasopneumatic   15 minutes    Vasopnuematic Location   Shoulder    Vasopneumatic Pressure  Low    Vasopneumatic Temperature   46      Manual Therapy   Manual Therapy  Soft tissue mobilization    Soft tissue mobilization  STW/M to left biceps to decrease pain and trigger points  PT Short Term Goals - 03/16/18 1400      PT SHORT TERM GOAL #1   Title  STG's=LTG's.        PT Long Term Goals - 09/01/18 1143      PT LONG TERM GOAL #1   Title  Independent with a HEP.    Time  8    Period  Weeks    Status  Achieved      PT LONG TERM GOAL #2   Title  Active left shoulder flexion to 145 degrees so the patient can easily reach overhead.    Time  8    Period  Weeks    Status  Achieved      PT LONG TERM GOAL #3   Title  Active ER to 70 degrees+ to allow for easily donning/doffing of apparel.    Time  8    Period  Weeks    Status  Not Met      PT LONG TERM GOAL #4   Title  Increase ROM so patient is able to reach behind back to L3.    Time  6    Period  Weeks    Status  Achieved      PT LONG TERM GOAL #5   Title  Increase left shoulder strength to a solid 4+/5 to  increase stability for performance of functional activities.    Time  8    Period  Weeks    Status  Partially Met      PT LONG TERM GOAL #6   Title  Perform ADL's with pain not > 2-3/10.    Time  6    Period  Weeks    Status  Achieved            Plan - 09/06/18 1231    Clinical Impression Statement  Patient was able to tolerate treatment well. Patient required verbal cuing for form. Patient and PT reviewed HEP as well as balance between strengthening and ROM. Patient reported understanding. Patient to be discharged with goals partially met.     Stability/Clinical Decision Making  Stable/Uncomplicated    Clinical Decision Making  Low    Rehab Potential  Good    PT Duration  2 weeks    PT Treatment/Interventions  ADLs/Self Care Home Management;Cryotherapy;Electrical Stimulation;Ultrasound;Therapeutic activities;Therapeutic exercise;Patient/family education;Manual techniques;Vasopneumatic Device    PT Next Visit Plan  Discharge       Patient will benefit from skilled therapeutic intervention in order to improve the following deficits and impairments:  Pain, Decreased activity tolerance, Decreased range of motion  Visit Diagnosis: Chronic left shoulder pain  Stiffness of left shoulder, not elsewhere classified     Problem List There are no active problems to display for this patient.  Gabriela Eves, PT, DPT 09/06/2018, 12:37 PM  [facility] [place], [place], [address] Phone: [phone]   Fax:  [phone]  Name: Chad Hensley MRN: [id number] Date of Birth: 03/25/1951

## 2021-05-28 LAB — PULMONARY FUNCTION TEST

## 2022-01-13 ENCOUNTER — Ambulatory Visit (INDEPENDENT_AMBULATORY_CARE_PROVIDER_SITE_OTHER): Payer: No Typology Code available for payment source | Admitting: Internal Medicine

## 2022-01-13 ENCOUNTER — Encounter: Payer: Self-pay | Admitting: Internal Medicine

## 2022-01-13 ENCOUNTER — Ambulatory Visit (HOSPITAL_COMMUNITY)
Admission: RE | Admit: 2022-01-13 | Discharge: 2022-01-13 | Disposition: A | Discharge status: Home / Self Care | Payer: No Typology Code available for payment source | Source: Ambulatory Visit | Attending: Internal Medicine | Admitting: Internal Medicine

## 2022-01-13 VITALS — BP 120/82 | HR 69 | Temp 98.2°F | Ht 67.0 in | Wt 176.0 lb

## 2022-01-13 DIAGNOSIS — I10 Essential (primary) hypertension: Secondary | ICD-10-CM | POA: Diagnosis not present

## 2022-01-13 DIAGNOSIS — J4489 Other specified chronic obstructive pulmonary disease: Secondary | ICD-10-CM | POA: Insufficient documentation

## 2022-01-13 DIAGNOSIS — J449 Chronic obstructive pulmonary disease, unspecified: Secondary | ICD-10-CM

## 2022-01-13 DIAGNOSIS — R0609 Other forms of dyspnea: Secondary | ICD-10-CM

## 2022-01-13 MED ORDER — LOSARTAN POTASSIUM-HCTZ 100-25 MG PO TABS: 1.0000 | ORAL_TABLET | Freq: Every day | ORAL | 11 refills | Status: DC

## 2022-01-13 NOTE — Progress Notes (Unsigned)
Chad Hensley, male    DOB: 20-Apr-1951    MRN: [id number]   Brief patient profile:  44  yowm [place] vet/ minimal smoking hx/ paint fume issues for 54 y and fine until onset of seasonal rhinitis spring since 2020 on referred to pulmonary clinic in [place]  01/13/2022 by [facility]  for chronic cough and doe    History of Present Illness  01/13/2022  Pulmonary/ 1st office eval/ Chad Hensley / [place] Office maint on wixella  Chief Complaint  Patient presents with   Consult    He reports he felt like he had the flu about 2-3 weeks ago, He had a slight fever no covid, He has some shortness of breath with exertion.   Dyspnea:  can't walk downtown struggles to back to house x 4 blocks slt uphill  Cough: dry, daytime  Sleep: no noct cough  SABA use: couple times day p ex never pre or rechallenges  No obvious day to day or daytime pattern/variability or assoc excess/ purulent sputum or mucus plugs or hemoptysis or cp or chest tightness, subjective wheeze or overt sinus or hb symptoms on ppi ac and h2 hs already   Sleeping  without nocturnal  or early am exacerbation  of respiratory  c/o's or need for noct saba. Also denies any obvious fluctuation of symptoms with weather or environmental changes or other aggravating or alleviating factors except as outlined above   No unusual exposure hx or h/o childhood pna/ asthma or knowledge of premature birth.  Current Allergies, Complete Past Medical History, Past Surgical History, Family History, and Social History were reviewed in [employer] record.  ROS  The following are not active complaints unless bolded Hoarseness, sore throat, dysphagia, dental problems, itching, sneezing,  nasal congestion or discharge of excess mucus or purulent secretions, ear ache,   fever, chills, sweats, unintended wt loss or wt gain, classically pleuritic or exertional cp,  orthopnea pnd or arm/hand swelling  or leg swelling, presyncope, palpitations, abdominal  pain, anorexia, nausea, vomiting, diarrhea  or change in bowel habits or change in bladder habits, change in stools or change in urine, dysuria, hematuria,  rash, arthralgias, visual complaints, headache, numbness, weakness or ataxia or problems with walking or coordination,  change in mood or  memory.           Past Medical History:  Diagnosis Date   PTSD (post-traumatic stress disorder) 05/04/1976    Outpatient Medications Prior to Visit  Medication Sig Dispense Refill   ALBUTEROL IN Inhale into the lungs as needed.     Artificial Tear Ointment (ARTIFICIAL TEARS) ointment Place into both eyes as needed.     atenolol (TENORMIN) 25 MG tablet Take by mouth daily.     benazepril-hydrochlorthiazide (LOTENSIN HCT) 10-12.5 MG per tablet Take 1 tablet by mouth daily.     buPROPion (WELLBUTRIN SR) 150 MG 12 hr tablet Take 150 mg by mouth 2 (two) times daily.     carboxymethylcellulose (REFRESH PLUS) 0.5 % SOLN 1 drop 3 (three) times daily as needed.     cetirizine (ZYRTEC) 10 MG tablet Take 10 mg by mouth daily.     Cholecalciferol 2000 UNITS TABS Take by mouth daily.     famotidine (PEPCID) 10 MG tablet Take 40 mg by mouth 2 (two) times daily.     gabapentin (NEURONTIN) 600 MG tablet Take 600 mg by mouth at bedtime.     ketotifen (ZADITOR) 0.025 % ophthalmic solution 1 drop 2 (two) times  daily.     niacin 500 MG tablet Take 500 mg by mouth at bedtime.     Omega-3 Fatty Acids (FISH OIL PO) Take by mouth daily.     pantoprazole (PROTONIX) 40 MG tablet Take 40 mg by mouth daily.     PARoxetine (PAXIL) 40 MG tablet Take 40 mg by mouth every morning.     pravastatin (PRAVACHOL) 20 MG tablet Take 20 mg by mouth daily.     ranitidine (ZANTAC) 150 MG tablet Take 150 mg by mouth at bedtime.     No facility-administered medications prior to visit.     Objective:     BP 120/82 (BP Location: Left Arm, Cuff Size: Normal)   Pulse 69   Temp 98.2 F (36.8 C)   Ht 5\' 7"  (1.702 m)   Wt 176 lb (79.8  kg)   SpO2 95% Comment: ra  BMI 27.57 kg/m   SpO2: 95 % (ra)  Amb pleasant wm / edentulous   HEENT : Oropharynx  clear  Nasal turbinates nl    NECK :  without  apparent JVD/ palpable Nodes/TM    LUNGS: no acc muscle use,  Min barrel  contour chest wall with bilateral  slightly decreased bs s audible wheeze and  without cough on insp or exp maneuvers and min  Hyperresonant  to  percussion bilaterally    CV:  RRR  no s3 or murmur or increase in P2, and no edema   ABD:  soft and nontender with pos end  insp Hoover's  in the supine position.  No bruits or organomegaly appreciated   MS:  Nl gait/ ext warm without deformities Or obvious joint restrictions  calf tenderness, cyanosis or clubbing     SKIN: warm and dry without lesions    NEURO:  alert, approp, nl sensorium with  no motor or cerebellar deficits apparent.                Assessment   No problem-specific Assessment & Plan notes found for this encounter.     , MD 01/13/2022

## 2022-01-13 NOTE — Patient Instructions (Addendum)
Stop benazapril   Start losartan 100- 12.5 one daily   .Only use your albuterol as a rescue medication to be used if you can't catch your breath by resting or doing a relaxed purse lip breathing pattern.  - The less you use it, the better it will work when you need it. - Ok to use up to 2 puffs  every 4 hours if you must but call for immediate appointment if use goes up over your usual need - Don't leave home without it !!  (think of it like the spare tire for your car)   Ok to try albuterol 15 min before an activity (on alternating days)  that you know would usually make you short of breath and see if it makes any difference and if makes none then don't take albuterol after activity unless you can't catch your breath as this means it's the resting that helps, not the albuterol.      Please schedule a follow up office visit in 6 weeks, call sooner if needed PFTs next available - need inhalers and your drug  formulary for [facility] for [facility] and hypertension

## 2022-01-13 NOTE — Assessment & Plan Note (Signed)
Onset was around 2020 assoc with nasal congestion / exp to mold since 2016  - PFT's  05/28/21  FEV1 1.86 (68 % ) ratio 0.41  P13%  % improvement from saba p ? 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   - 01/13/2022 rec max GERD rx/ off ACEi and approp saba   DDX of  difficult airways management almost all start with A and  include Adherence, Ace Inhibitors, Acid Reflux, Active Sinus Disease, Alpha 1 Antitripsin deficiency, Anxiety masquerading as Airways dz,  ABPA,  Allergy(esp in young), Aspiration (esp in elderly), Adverse effects of meds,  Active smoking or vaping, A bunch of PE's (a small clot burden can't cause this syndrome unless there is already severe underlying pulm or vascular dz with poor reserve) plus two Bs  = Bronchiectasis and Beta blocker use..and one C= CHF   Adherence is always the initial "prime suspect" and is a multilayered concern that requires a "trust but verify" approach in every patient - starting with knowing how to use medications, especially inhalers, correctly, keeping up with refills and understanding the fundamental difference between maintenance and prns vs those medications only taken for a very short course and then stopped and not refilled.  - - The proper method of use, as well as anticipated side effects, of a metered-dose inhaler were discussed and demonstrated to the patient using teach back method  - return with all meds in hand using a trust but verify approach to confirm accurate Medication  Reconciliation The principal here is that until we are certain that the  patients are doing what we've asked, it makes no sense to ask them to do more.    ACEi adverse effects at the  top of the usual list of suspects and the only way to rule it out is a trial off > see a/p    ? Allergy/asthma > continue wixella and return with it with list of alternative because: Re  saba: Re SABA :  I spent extra time with pt today reviewing appropriate use of albuterol for prn use on exertion with the following points: 1) saba is for relief of sob that does not improve by walking a slower pace or resting but rather if the pt does not improve after trying this first. 2) If the pt is convinced, as many are, that saba helps recover from activity faster then it's easy to tell if this is the case by re-challenging : ie stop, take the inhaler, then p 5 minutes try the exact same activity (intensity of workload) that just caused the symptoms and see if they are substantially diminished or not after saba 3) if there is an activity that reproducibly causes the symptoms, try the saba 15 min before the activity on alternate days   If in fact the saba really does help, then fine to continue to use it prn but advised may need to look closer at the maintenance regimen being used to achieve better control of airways disease with exertion.   ? Adverse effects of dpi, esp fluticasone > may need trial off   ? Acid (or non-acid) GERD > always difficult to exclude as up to 75% of pts in some series report no assoc GI/ Heartburn symptoms> rec max (24h)  acid suppression and diet restrictions/ reviewed and instructions given in writing.   ?  Alpha  One AT  def > check phenotype

## 2022-01-13 NOTE — Assessment & Plan Note (Signed)
Trial off ace 01/13/2022 for cough/ atypical asthma pattern  In the best review of chronic cough to date ( NEJM 2016 375 [id number]) ,  ACEi are now felt to cause cough in up to  20% of pts which is a 4 fold increase from previous reports and does not include the variety of non-specific complaints we see in pulmonary clinic in pts on ACEi but previously attributed to another dx like  Copd/asthma and  include PNDS, throat and chest congestion, "bronchitis", unexplained dyspnea and noct "strangling" sensations, and hoarseness, but also  atypical /refractory GERD symptoms like dysphagia and "bad heartburn"   The only way I know  to prove this is not an "ACEi Case" is a trial off ACEi x a minimum of 6 weeks then regroup.    >>> try losartan- hctz 100-25 one daily / self monitor BP    Return in 6 weeks with all meds in hand using a trust but verify approach to confirm accurate Medication  Reconciliation The principal here is that until we are certain that the  patients are doing what we've asked, it makes no sense to ask them to do more.           Each maintenance medication was reviewed in detail including emphasizing most importantly the difference between maintenance and prns and under what circumstances the prns are to be triggered using an action plan format where appropriate.  Total time for H and P, chart review, counseling, reviewing hfa/dpi device(s) and generating customized AVS unique to this office visit / same day charting > 60 min with pt new to me

## 2022-01-14 ENCOUNTER — Encounter: Payer: Self-pay | Admitting: Internal Medicine

## 2022-01-15 LAB — CBC WITH DIFFERENTIAL/PLATELET
Basophils Absolute: 0 10*3/uL (ref 0.0–0.2)
Basos: 1 %
EOS (ABSOLUTE): 0.4 10*3/uL (ref 0.0–0.4)
Eos: 5 %
Hematocrit: 47.5 % (ref 37.5–51.0)
Hemoglobin: 15.8 g/dL (ref 13.0–17.7)
Immature Grans (Abs): 0 10*3/uL (ref 0.0–0.1)
Immature Granulocytes: 0 %
Lymphocytes Absolute: 2 10*3/uL (ref 0.7–3.1)
Lymphs: 27 %
MCH: 30.4 pg (ref 26.6–33.0)
MCHC: 33.3 g/dL (ref 31.5–35.7)
MCV: 92 fL (ref 79–97)
Monocytes Absolute: 0.5 10*3/uL (ref 0.1–0.9)
Monocytes: 7 %
Neutrophils Absolute: 4.5 10*3/uL (ref 1.4–7.0)
Neutrophils: 60 %
Platelets: 230 10*3/uL (ref 150–450)
RBC: 5.19 x10E6/uL (ref 4.14–5.80)
RDW: 12.8 % (ref 11.6–15.4)
WBC: 7.5 10*3/uL (ref 3.4–10.8)

## 2022-01-15 LAB — IGE: IgE (Immunoglobulin E), Serum: <2 IU/mL — ABNORMAL LOW (ref 6–495)

## 2022-01-15 LAB — ALPHA-1-ANTITRYPSIN PHENOTYP: A-1 Antitrypsin: 154 mg/dL (ref 101–187)

## 2022-02-15 ENCOUNTER — Encounter (HOSPITAL_COMMUNITY): Payer: Self-pay | Admitting: Emergency Medicine

## 2022-02-15 ENCOUNTER — Other Ambulatory Visit: Payer: Self-pay

## 2022-02-15 ENCOUNTER — Observation Stay (HOSPITAL_COMMUNITY)
Admission: EM | Admit: 2022-02-15 | Discharge: 2022-02-17 | Disposition: A | Discharge status: Home / Self Care | Payer: No Typology Code available for payment source | Attending: Internal Medicine | Admitting: Internal Medicine

## 2022-02-15 ENCOUNTER — Emergency Department (HOSPITAL_COMMUNITY): Payer: No Typology Code available for payment source

## 2022-02-15 DIAGNOSIS — L409 Psoriasis, unspecified: Secondary | ICD-10-CM

## 2022-02-15 DIAGNOSIS — R0602 Shortness of breath: Secondary | ICD-10-CM | POA: Diagnosis present

## 2022-02-15 DIAGNOSIS — Z79899 Other long term (current) drug therapy: Secondary | ICD-10-CM | POA: Diagnosis not present

## 2022-02-15 DIAGNOSIS — R4781 Slurred speech: Secondary | ICD-10-CM | POA: Diagnosis not present

## 2022-02-15 DIAGNOSIS — F4310 Post-traumatic stress disorder, unspecified: Secondary | ICD-10-CM | POA: Diagnosis present

## 2022-02-15 DIAGNOSIS — I10 Essential (primary) hypertension: Secondary | ICD-10-CM | POA: Diagnosis not present

## 2022-02-15 DIAGNOSIS — G43909 Migraine, unspecified, not intractable, without status migrainosus: Secondary | ICD-10-CM

## 2022-02-15 DIAGNOSIS — K219 Gastro-esophageal reflux disease without esophagitis: Secondary | ICD-10-CM

## 2022-02-15 DIAGNOSIS — J45909 Unspecified asthma, uncomplicated: Secondary | ICD-10-CM | POA: Insufficient documentation

## 2022-02-15 DIAGNOSIS — Z87891 Personal history of nicotine dependence: Secondary | ICD-10-CM | POA: Diagnosis not present

## 2022-02-15 DIAGNOSIS — G459 Transient cerebral ischemic attack, unspecified: Secondary | ICD-10-CM | POA: Diagnosis not present

## 2022-02-15 DIAGNOSIS — I160 Hypertensive urgency: Secondary | ICD-10-CM

## 2022-02-15 LAB — URINALYSIS, ROUTINE W REFLEX MICROSCOPIC
Bacteria, UA: NONE SEEN
Bilirubin Urine: NEGATIVE
Glucose, UA: NEGATIVE mg/dL
Hgb urine dipstick: NEGATIVE
Ketones, ur: NEGATIVE mg/dL
Nitrite: NEGATIVE
Protein, ur: NEGATIVE mg/dL
Specific Gravity, Urine: 1.003 — ABNORMAL LOW (ref 1.005–1.030)
pH: 7 (ref 5.0–8.0)

## 2022-02-15 LAB — CBC
HCT: 47.3 % (ref 39.0–52.0)
Hemoglobin: 16.4 g/dL (ref 13.0–17.0)
MCH: 30.5 pg (ref 26.0–34.0)
MCHC: 34.7 g/dL (ref 30.0–36.0)
MCV: 87.9 fL (ref 80.0–100.0)
Platelets: 216 10*3/uL (ref 150–400)
RBC: 5.38 MIL/uL (ref 4.22–5.81)
RDW: 13.3 % (ref 11.5–15.5)
WBC: 8.4 10*3/uL (ref 4.0–10.5)
nRBC: 0 % (ref 0.0–0.2)

## 2022-02-15 LAB — DIFFERENTIAL
Abs Immature Granulocytes: 0.02 10*3/uL (ref 0.00–0.07)
Basophils Absolute: 0 10*3/uL (ref 0.0–0.1)
Basophils Relative: 1 %
Eosinophils Absolute: 0.1 10*3/uL (ref 0.0–0.5)
Eosinophils Relative: 1 %
Immature Granulocytes: 0 %
Lymphocytes Relative: 17 %
Lymphs Abs: 1.5 10*3/uL (ref 0.7–4.0)
Monocytes Absolute: 0.6 10*3/uL (ref 0.1–1.0)
Monocytes Relative: 7 %
Neutro Abs: 6.2 10*3/uL (ref 1.7–7.7)
Neutrophils Relative %: 74 %

## 2022-02-15 LAB — APTT: aPTT: 28 seconds (ref 24–36)

## 2022-02-15 LAB — RAPID URINE DRUG SCREEN, HOSP PERFORMED
Amphetamines: NOT DETECTED
Barbiturates: NOT DETECTED
Benzodiazepines: POSITIVE — AB
Cocaine: NOT DETECTED
Opiates: NOT DETECTED
Tetrahydrocannabinol: NOT DETECTED

## 2022-02-15 LAB — PROTIME-INR
INR: 1 (ref 0.8–1.2)
Prothrombin Time: 13.4 seconds (ref 11.4–15.2)

## 2022-02-15 LAB — COMPREHENSIVE METABOLIC PANEL
ALT: 33 U/L (ref 0–44)
AST: 34 U/L (ref 15–41)
Albumin: 4.5 g/dL (ref 3.5–5.0)
Alkaline Phosphatase: 90 U/L (ref 38–126)
Anion gap: 8 (ref 5–15)
BUN: 13 mg/dL (ref 8–23)
CO2: 27 mmol/L (ref 22–32)
Calcium: 9.8 mg/dL (ref 8.9–10.3)
Chloride: 107 mmol/L (ref 98–111)
Creatinine, Ser: 1 mg/dL (ref 0.61–1.24)
GFR, Estimated: >60 mL/min (ref >60)
Glucose, Bld: 119 mg/dL — ABNORMAL HIGH (ref 70–99)
Potassium: 3.5 mmol/L (ref 3.5–5.1)
Sodium: 142 mmol/L (ref 135–145)
Total Bilirubin: 1.2 mg/dL (ref 0.3–1.2)
Total Protein: 7.3 g/dL (ref 6.5–8.1)

## 2022-02-15 LAB — HEMOGLOBIN A1C
Hgb A1c MFr Bld: 5.4 % (ref 4.8–5.6)
Mean Plasma Glucose: 108.28 mg/dL

## 2022-02-15 LAB — ETHANOL: Alcohol, Ethyl (B): <10 mg/dL (ref <10)

## 2022-02-15 MED ORDER — LABETALOL HCL 5 MG/ML IV SOLN: 10.0000 mg | INTRAVENOUS | Status: DC | PRN

## 2022-02-15 MED ORDER — STROKE: EARLY STAGES OF RECOVERY BOOK: Freq: Once | Status: AC

## 2022-02-15 MED ORDER — LORAZEPAM 2 MG/ML IJ SOLN
0.5000 mg | Freq: Once | INTRAMUSCULAR | Status: AC
  Administered 2022-02-16: 0.5 mg via INTRAVENOUS
  Filled 2022-02-15: qty 1

## 2022-02-15 MED ORDER — ACETAMINOPHEN 650 MG RE SUPP: 650.0000 mg | RECTAL | Status: DC | PRN

## 2022-02-15 MED ORDER — ACETAMINOPHEN 160 MG/5ML PO SOLN: 650.0000 mg | ORAL | Status: DC | PRN

## 2022-02-15 MED ORDER — IBUPROFEN 400 MG PO TABS
600.0000 mg | ORAL_TABLET | Freq: Once | ORAL | Status: AC
  Administered 2022-02-15: 600 mg via ORAL
  Filled 2022-02-15: qty 2

## 2022-02-15 MED ORDER — ATORVASTATIN CALCIUM 10 MG PO TABS
20.0000 mg | ORAL_TABLET | Freq: Every day | ORAL | Status: DC
  Administered 2022-02-15 – 2022-02-17 (×3): 20 mg via ORAL
  Filled 2022-02-15 (×3): qty 2

## 2022-02-15 MED ORDER — SENNOSIDES-DOCUSATE SODIUM 8.6-50 MG PO TABS: 1.0000 | ORAL_TABLET | Freq: Every evening | ORAL | Status: DC | PRN

## 2022-02-15 MED ORDER — ACETAMINOPHEN 325 MG PO TABS
650.0000 mg | ORAL_TABLET | ORAL | Status: DC | PRN
  Administered 2022-02-16 – 2022-02-17 (×2): 650 mg via ORAL
  Filled 2022-02-15 (×2): qty 2

## 2022-02-15 MED ORDER — ASPIRIN 81 MG PO TBEC
81.0000 mg | DELAYED_RELEASE_TABLET | Freq: Every day | ORAL | Status: DC
  Administered 2022-02-16 – 2022-02-17 (×2): 81 mg via ORAL
  Filled 2022-02-15 (×2): qty 1

## 2022-02-15 MED ORDER — POTASSIUM CHLORIDE 20 MEQ PO PACK
40.0000 meq | PACK | Freq: Once | ORAL | Status: AC
  Administered 2022-02-15: 40 meq via ORAL
  Filled 2022-02-15: qty 2

## 2022-02-15 MED ORDER — ROSUVASTATIN CALCIUM 10 MG PO TABS: 5.0000 mg | ORAL_TABLET | Freq: Every day | ORAL | Status: DC

## 2022-02-15 MED ORDER — ASPIRIN 325 MG PO TABS
325.0000 mg | ORAL_TABLET | Freq: Once | ORAL | Status: AC
  Administered 2022-02-15: 325 mg via ORAL
  Filled 2022-02-15: qty 1

## 2022-02-15 NOTE — ED Notes (Signed)
Patient transported to CT 

## 2022-02-15 NOTE — ED Notes (Signed)
Pt returned to room from CT

## 2022-02-15 NOTE — ED Triage Notes (Signed)
Pt BIB [facility] with reports of hypertension and blurred vision. Pt reports episodes of blurred vision of "the past couple of days." Pt also stating he noticed that his speech was slurred while at church today but has since resolved.

## 2022-02-15 NOTE — Assessment & Plan Note (Addendum)
New rash to face. 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  He applied antibiotic cream to his face this morning, rash appears worse now.  Started new medication losartan 10/12. -Monitor rash for now. -Hold off on further creams, hold losartan for now allow for permissive hypertension.

## 2022-02-15 NOTE — ED Notes (Signed)
Patient given dinner tray at this time.

## 2022-02-15 NOTE — H&P (Signed)
History and Physical    Chad Hensley MGQ:[id number] DOB: 1951/02/24 DOA: 02/15/2022  PCP: [facility]   Patient coming from: Home  I have personally briefly reviewed patient's old medical records in [facility]  Chief Complaint: Home  HPI: Chad Hensley is a 71 y.o. male with medical history significant for hypertension, asthma, PTSD. Patient presented to the ED with complaints of blurred vision and slurred speech.  He reports ongoing symptoms over the past week.  About 3-4 episodes.  He reports most times both the blurry vision and slurred speech occur the same time, with associated headaches at the back of his head and later front of his head.  He reports occasional history of mild/stress headaches, but not like this.  No facial asymmetry, no numbness or abnormal sensation of his extremity, no weakness of his extremities.  He is not on daily aspirin. He also reports over the past 2 weeks his blood pressure has been elevated, up to 190-200 systolic.   Today while he was at church, he had another episode of blurry vision, and slurred speech- he talked to a church member who did not understand what he was saying.  He also reports a new rash to his face that started today.  Rash was initially mild, so he applied topical antibiotic cream which she has used in the past, but now the rash appears worse.  Blood pressure medication was switched from ACE inhibitor to ARB which he started 5 days ago 10/10 due to reports of cough.  His cough has improved.  [facility] involving his scalp but not his face or extremities.  ED Course: Blood pressure 150s to 178 systolic.  Unremarkable CBC BMP.  Head CT negative for acute abnormality.  Neurologist Dr. Selina Cooley was recommend stroke work-up, impression - crescendo TIA or hypertensive urgency, allow for relative permissive HTN so up to 180 SBP (not the usual 220) and aspirin 81mg  daily.  Review of Systems: As  per HPI all other systems reviewed and negative.  Past Medical History:  Diagnosis Date   PTSD (post-traumatic stress disorder) 05/04/1976    History reviewed. No pertinent surgical history.   reports that he quit smoking about 51 years ago. His smoking use included cigarettes. He started smoking about 52 years ago. He has a 1.00 pack-year smoking history. He does not have any smokeless tobacco history on file. He reports that he does not drink alcohol and does not use drugs.  Allergies  Allergen Reactions   Antihistamines, Diphenhydramine-Type Palpitations   Codeine Nausea And Vomiting    Family History  Problem Relation Age of Onset   Hypertension Mother    Hypertension Father    Alpha-1 antitrypsin deficiency Neg Hx    Tuberous sclerosis Neg Hx     Prior to Admission medications   Medication Sig Start Date End Date Taking? Authorizing Provider  ALBUTEROL IN Inhale into the lungs as needed.    [provider]  Artificial Tear Ointment (ARTIFICIAL TEARS) ointment Place into both eyes as needed.    [provider]  atenolol (TENORMIN) 25 MG tablet Take by mouth daily.    [provider]  buPROPion (WELLBUTRIN SR) 150 MG 12 hr tablet Take 150 mg by mouth 2 (two) times daily.    [provider]  carboxymethylcellulose (REFRESH PLUS) 0.5 % SOLN 1 drop 3 (three) times daily as needed.    [provider]  cetirizine (ZYRTEC) 10  MG tablet Take 10 mg by mouth daily.    [provider]  Cholecalciferol 2000 UNITS TABS Take by mouth daily.    [provider]  famotidine (PEPCID) 10 MG tablet Take 40 mg by mouth 2 (two) times daily.    [provider]  gabapentin (NEURONTIN) 600 MG tablet Take 600 mg by mouth at bedtime.    [provider]  ketotifen (ZADITOR) 0.025 % ophthalmic solution 1 drop 2 (two) times daily.    [provider]  losartan-hydrochlorothiazide (HYZAAR) 100-25 MG tablet Take 1  tablet by mouth daily. 01/13/22   Nyoka Cowden, MD  niacin 500 MG tablet Take 500 mg by mouth at bedtime.    [provider]  Omega-3 Fatty Acids (FISH OIL PO) Take by mouth daily.    [provider]  pantoprazole (PROTONIX) 40 MG tablet Take 40 mg by mouth daily.    [provider]  PARoxetine (PAXIL) 40 MG tablet Take 40 mg by mouth every morning.    [provider]  pravastatin (PRAVACHOL) 20 MG tablet Take 20 mg by mouth daily.    [provider]  ranitidine (ZANTAC) 150 MG tablet Take 150 mg by mouth at bedtime.    [provider]    Physical Exam: Vitals:   02/15/22 1600 02/15/22 1615 02/15/22 1630 02/15/22 1645  BP: (!) 171/101  (!) 156/93   Pulse: 95 86 75 81  Resp: 20 19 20 20   SpO2: 94% 94% 92% 95%    Constitutional: NAD, calm, comfortable Vitals:   02/15/22 1600 02/15/22 1615 02/15/22 1630 02/15/22 1645  BP: (!) 171/101  (!) 156/93   Pulse: 95 86 75 81  Resp: 20 19 20 20   SpO2: 94% 94% 92% 95%   Eyes: PERRL, lids and conjunctivae normal ENMT: Mucous membranes are moist..  Neck: normal, supple, no masses, no thyromegaly Respiratory: clear to auscultation bilaterally, no wheezing, no crackles.  Cardiovascular: Regular rate and rhythm, no murmurs / rubs / gallops. No extremity edema.  Abdomen: no tenderness, no masses palpated. No hepatosplenomegaly. Bowel sounds positive.  Musculoskeletal: no clubbing / cyanosis. No joint deformity upper and lower extremities.  Skin: Diffuse slightly raised rash to face-involving chin, cheeks around nasolabial fold, sparing extremities trunk and back, erythematous,  no ulcers. No induration Neurologic: No facial asymmetry, speech clear without evidence of aphasia.  Vision intact at this time, 5/5 strength bilateral upper and lower extremity, sensation globally intact. Psychiatric: Normal judgment and insight. Alert and oriented x 3. Normal mood.   Labs on Admission: I have  personally reviewed following labs and imaging studies  CBC: Recent Labs  Lab 02/15/22 1534  WBC 8.4  NEUTROABS 6.2  HGB 16.4  HCT 47.3  MCV 87.9  PLT 216   Basic Metabolic Panel: Recent Labs  Lab 02/15/22 1534  NA 142  K 3.5  CL 107  CO2 27  GLUCOSE 119*  BUN 13  CREATININE 1.00  CALCIUM 9.8   Liver Function Tests: Recent Labs  Lab 02/15/22 1534  AST 34  ALT 33  ALKPHOS 90  BILITOT 1.2  PROT 7.3  ALBUMIN 4.5   Coagulation Profile: Recent Labs  Lab 02/15/22 1534  INR 1.0   Radiological Exams on Admission: CT HEAD WO CONTRAST  Result Date: 02/15/2022 CLINICAL DATA:  Neuro deficit, acute, stroke suspected EXAM: CT HEAD WITHOUT CONTRAST TECHNIQUE: Contiguous axial images were obtained from the base of the skull through the vertex without intravenous contrast. RADIATION DOSE  REDUCTION: This exam was performed according to the departmental dose-optimization program which includes automated exposure control, adjustment of the mA and/or kV according to patient size and/or use of iterative reconstruction technique. COMPARISON:  None Available. FINDINGS: Brain: No evidence of acute infarction, hemorrhage, hydrocephalus, extra-axial collection or mass lesion/mass effect. Scattered low-density changes within the periventricular and subcortical white matter compatible with chronic microvascular ischemic change. Mild diffuse cerebral volume loss. Vascular: No hyperdense vessel or unexpected calcification. Skull: Normal. Negative for fracture or focal lesion. Sinuses/Orbits: No acute finding. Other: None. IMPRESSION: 1. No acute intracranial abnormality. 2. Chronic microvascular ischemic change and cerebral volume loss. Electronically Signed   By: Davina Poke D.O.   On: 02/15/2022 15:58    EKG: Independently reviewed.  Sinus rhythm rate 88, QTc 470.  No segment ST or T wave abnormalities.  No prior EKG to compare.  Assessment/Plan Principal Problem:   Slurred  speech Active Problems:   Essential hypertension   PTSD (post-traumatic stress disorder)   Psoriasis  Assessment and Plan: * Slurred speech 4 - 5 episodes of transient slurred speech with blurry vision and headaches over the past 1 week with associated elevated blood pressure systolic up to 161. Currently symptom free. Head Ct- negative for acute abnormality.  Not on daily aspirin. - Neurologist Dr. Quinn Axe consulted- possible either crescendo TIA or hypertensive urgency.  Recommends relative permissive HTN so up to 180 SBP (not the usual 220) and aspirin 81mg  daily. - MRI brain - ECHO -Carotid Dopplers - Lipid panel, Hgba1c - Switch Pravastatin 20mg  to Crestor 10mg  daily - PT, ST eval  Essential hypertension Elevated up to 096E systolic at home, with associated symptoms of slurred speech and blurry vision. ACE-inh recently switched to ARB. - Allow for relative permissive HTN, hold Losartan HCTZ, atenolol - PRN Labetalol for Bp > 190/105.  Psoriasis New rash to face. 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  He applied antibiotic cream to his face this morning, rash appears worse now.  Started new medication losartan 10/12. -Monitor rash for now. -Hold off on further creams, hold losartan for now allow for permissive hypertension.  PTSD (post-traumatic stress disorder) Awaiting med reconciliation   DVT prophylaxis: SCDS for now pending MRI findings Code Status: FULL Family Communication: None at bedside. Disposition Plan: ~ 1- 2 days Consults called: Neurology Admission status: Obs tele    Author: Bethena Roys, MD 02/15/2022 10:05 PM  For on call review [email or web address].

## 2022-02-15 NOTE — ED Provider Notes (Signed)
[facility] EMERGENCY DEPARTMENT Provider Note   CSN: [id number] Arrival date & time: 02/15/22  1509     History {Add pertinent medical, surgical, social history, OB history to HPI:1} Chief Complaint  Patient presents with   Hypertension    ASHTEN PRATS is a 71 y.o. male.   Hypertension     This patient is a 71 year old male, he has a known history of hypertension previously treated with lisinopril and hydrochlorothiazide.  About 1 month ago the patient was seen by his pulmonologist and had been complaining of shortness of breath with a cough.  He was switched from lisinopril to losartan and the cough and shortness of breath seems to have improved.  That being said over the last week he has noticed increasing blood pressures at home sometimes as high as 200 systolic where his normal is around 135.  Associated with this has been some transient and fluctuating episodes of blurred vision and difficulty speaking.  Today when he was at church she had another 1 of these episodes where he felt like he could not see the words on the him book that he was singing from and at the same time his words were garbled and slurred and the person next to him could not understand what he was saying.  He noted that his blood pressure was severely elevated and thus he called the paramedics.  They found his blood pressure to be around 175 systolic, his neurologic symptoms had completely resolved by the time of arrival to the emergency department.  In fact the paramedics did not note any abnormal neurologic findings on their exam either.  No medications were administered prehospital.  The patient also takes atenolol, gabapentin, paroxetine, pravastatin.  Home Medications Prior to Admission medications   Medication Sig Start Date End Date Taking? Authorizing Provider  ALBUTEROL IN Inhale into the lungs as needed.    [provider]  Artificial Tear Ointment (ARTIFICIAL TEARS) ointment Place into both  eyes as needed.    [provider]  atenolol (TENORMIN) 25 MG tablet Take by mouth daily.    [provider]  buPROPion (WELLBUTRIN SR) 150 MG 12 hr tablet Take 150 mg by mouth 2 (two) times daily.    [provider]  carboxymethylcellulose (REFRESH PLUS) 0.5 % SOLN 1 drop 3 (three) times daily as needed.    [provider]  cetirizine (ZYRTEC) 10 MG tablet Take 10 mg by mouth daily.    [provider]  Cholecalciferol 2000 UNITS TABS Take by mouth daily.    [provider]  famotidine (PEPCID) 10 MG tablet Take 40 mg by mouth 2 (two) times daily.    [provider]  gabapentin (NEURONTIN) 600 MG tablet Take 600 mg by mouth at bedtime.    [provider]  ketotifen (ZADITOR) 0.025 % ophthalmic solution 1 drop 2 (two) times daily.    [provider]  losartan-hydrochlorothiazide (HYZAAR) 100-25 MG tablet Take 1 tablet by mouth daily. 01/13/22   Nyoka Cowden, MD  niacin 500 MG tablet Take 500 mg by mouth at bedtime.    [provider]  Omega-3 Fatty Acids (FISH OIL PO) Take by mouth daily.    [provider]  pantoprazole (PROTONIX) 40 MG tablet Take 40 mg by mouth daily.    [provider]  PARoxetine (PAXIL) 40 MG tablet Take 40 mg by mouth every morning.    [provider]  pravastatin (PRAVACHOL) 20 MG tablet Take 20  mg by mouth daily.    [provider]  ranitidine (ZANTAC) 150 MG tablet Take 150 mg by mouth at bedtime.    [provider]      Allergies    Antihistamines, diphenhydramine-type and Codeine    Review of Systems   Review of Systems  All other systems reviewed and are negative.   Physical Exam Updated Vital Signs There were no vitals taken for this visit. Physical Exam Vitals and nursing note reviewed.  Constitutional:      General: He is not in acute distress.    Appearance: He is well-developed.  HENT:     Head: Normocephalic and  atraumatic.     Mouth/Throat:     Pharynx: No oropharyngeal exudate.  Eyes:     General: No scleral icterus.       Right eye: No discharge.        Left eye: No discharge.     Conjunctiva/sclera: Conjunctivae normal.     Pupils: Pupils are equal, round, and reactive to light.  Neck:     Thyroid: No thyromegaly.     Vascular: No JVD.     Comments: Bilateral carotid arteries were auscultated, no carotid bruits Cardiovascular:     Rate and Rhythm: Normal rate and regular rhythm.     Heart sounds: Normal heart sounds. No murmur heard.    No friction rub. No gallop.  Pulmonary:     Effort: Pulmonary effort is normal. No respiratory distress.     Breath sounds: Normal breath sounds. No wheezing or rales.  Abdominal:     General: Bowel sounds are normal. There is no distension.     Palpations: Abdomen is soft. There is no mass.     Tenderness: There is no abdominal tenderness.  Musculoskeletal:        General: No tenderness. Normal range of motion.     Cervical back: Normal range of motion and neck supple.  Lymphadenopathy:     Cervical: No cervical adenopathy.  Skin:    General: Skin is warm and dry.     Findings: No erythema or rash.  Neurological:     Mental Status: He is alert.     Coordination: Coordination normal.     Comments: Speech is clear, cranial nerves III through XII are intact, memory is intact, strength is normal in all 4 extremities including grips, sensation is intact to light touch and pinprick in all 4 extremities. Coordination as tested by finger-nose-finger is normal, no limb ataxia. Normal gait, normal reflexes at the patellar tendons bilaterally  Psychiatric:        Behavior: Behavior normal.     ED Results / Procedures / Treatments   Labs (all labs ordered are listed, but only abnormal results are displayed) Labs Reviewed - No data to display  EKG None  Radiology No results found.  Procedures Procedures  {Document cardiac monitor, telemetry  assessment procedure when appropriate:1}  Medications Ordered in ED Medications - No data to display  ED Course/ Medical Decision Making/ A&P                           Medical Decision Making Amount and/or Complexity of Data Reviewed Labs: ordered. Radiology: ordered.   This patient presents to the ED for concern of slurred speech with blurred vision and an episode of near syncope while he was at church today he denies having a feeling of blacking out but felt like  he could not get his balance, this involves an extensive number of treatment options, and is a complaint that carries with it a high risk of complications and morbidity.  The differential diagnosis includes transient ischemic attack, hypertensive urgency, near syncope, cardiac cause, he is not an arrhythmia at this time   Co morbidities that complicate the patient evaluation  Hypertension   Additional history obtained:  Additional history obtained from electronic medical record External records from outside source obtained and reviewed including prior pulmonology notes   Lab Tests:  I Ordered, and personally interpreted labs.  The pertinent results include:  ***   Imaging Studies ordered:  I ordered imaging studies including ***  I independently visualized and interpreted imaging which showed *** I agree with the radiologist interpretation   Cardiac Monitoring: / EKG:  The patient was maintained on a cardiac monitor.  I personally viewed and interpreted the cardiac monitored which showed an underlying rhythm of: ***   Consultations Obtained:  I requested consultation with the ***,  and discussed lab and imaging findings as well as pertinent plan - they recommend: ***   Problem List / ED Course / Critical interventions / Medication management  *** I ordered medication including ***  for ***  Reevaluation of the patient after these medicines showed that the patient  {resolved/improved/worsened:23923::"improved"} I have reviewed the patients home medicines and have made adjustments as needed   Social Determinants of Health:  ***   Test / Admission - Considered:  ***   {Document critical care time when appropriate:1} {Document review of labs and clinical decision tools ie heart score, Chads2Vasc2 etc:1}  {Document your independent review of radiology images, and any outside records:1} {Document your discussion with family members, caretakers, and with consultants:1} {Document social determinants of health affecting pt's care:1} {Document your decision making why or why not admission, treatments were needed:1} Final Clinical Impression(s) / ED Diagnoses Final diagnoses:  None    Rx / DC Orders ED Discharge Orders     None

## 2022-02-15 NOTE — Assessment & Plan Note (Signed)
Awaiting med reconciliation

## 2022-02-15 NOTE — Assessment & Plan Note (Addendum)
-  4 - 5 episodes of transient slurred speech with blurry vision and headaches over the past 1 week. -Patient also reported intermittent episode of lightheadedness.  -Antihypertensive agents has been adjusted. -CT angiogram demonstrated no acute intracranial abnormalities or significant stenosis. -Patient cleared by speech therapy and physical therapy to return home. -Continue risk factor modifications and the use of baby aspirin for secondary prevention.

## 2022-02-15 NOTE — ED Notes (Signed)
Patient called to go to restroom, upon standing patient states that he was a little lightheaded. Patient given a minute regain balance and was able to ambulate to restroom with standby assistance. Patient able to void and give urine specimen at this time. Patient back in room and placed back on cardiac monitor.

## 2022-02-15 NOTE — Assessment & Plan Note (Addendum)
Elevated up to 384Y systolic at home, with associated symptoms of slurred speech and blurry vision. ACE-inh recently switched to ARB. - Allow for relative permissive HTN, hold Losartan HCTZ, atenolol - PRN Labetalol for Bp > 180/105.

## 2022-02-16 ENCOUNTER — Observation Stay (HOSPITAL_COMMUNITY): Payer: No Typology Code available for payment source

## 2022-02-16 ENCOUNTER — Encounter (HOSPITAL_COMMUNITY): Payer: Self-pay | Admitting: Radiology

## 2022-02-16 ENCOUNTER — Other Ambulatory Visit (HOSPITAL_COMMUNITY): Payer: Self-pay | Admitting: *Deleted

## 2022-02-16 ENCOUNTER — Observation Stay (HOSPITAL_BASED_OUTPATIENT_CLINIC_OR_DEPARTMENT_OTHER): Payer: No Typology Code available for payment source

## 2022-02-16 DIAGNOSIS — K219 Gastro-esophageal reflux disease without esophagitis: Secondary | ICD-10-CM

## 2022-02-16 DIAGNOSIS — F4310 Post-traumatic stress disorder, unspecified: Secondary | ICD-10-CM | POA: Diagnosis not present

## 2022-02-16 DIAGNOSIS — R4781 Slurred speech: Secondary | ICD-10-CM | POA: Diagnosis not present

## 2022-02-16 DIAGNOSIS — I10 Essential (primary) hypertension: Secondary | ICD-10-CM | POA: Diagnosis not present

## 2022-02-16 DIAGNOSIS — I160 Hypertensive urgency: Secondary | ICD-10-CM | POA: Diagnosis not present

## 2022-02-16 DIAGNOSIS — G459 Transient cerebral ischemic attack, unspecified: Secondary | ICD-10-CM | POA: Diagnosis not present

## 2022-02-16 LAB — LIPID PANEL
Cholesterol: 131 mg/dL (ref 0–200)
HDL: 33 mg/dL — ABNORMAL LOW (ref >40)
LDL Cholesterol: 73 mg/dL (ref 0–99)
Total CHOL/HDL Ratio: 4 RATIO
Triglycerides: 123 mg/dL (ref <150)
VLDL: 25 mg/dL (ref 0–40)

## 2022-02-16 LAB — ECHOCARDIOGRAM COMPLETE
Area-P 1/2: 3.77 cm2
S' Lateral: 3.5 cm

## 2022-02-16 MED ORDER — ISOSORBIDE MONONITRATE ER 60 MG PO TB24
30.0000 mg | ORAL_TABLET | Freq: Every day | ORAL | Status: DC
  Administered 2022-02-16 – 2022-02-17 (×2): 30 mg via ORAL
  Filled 2022-02-16 (×2): qty 1

## 2022-02-16 MED ORDER — LOSARTAN POTASSIUM-HCTZ 100-25 MG PO TABS: 1.0000 | ORAL_TABLET | Freq: Every day | ORAL | Status: DC

## 2022-02-16 MED ORDER — PANTOPRAZOLE SODIUM 40 MG PO TBEC
40.0000 mg | DELAYED_RELEASE_TABLET | Freq: Every day | ORAL | Status: DC
  Administered 2022-02-16 – 2022-02-17 (×2): 40 mg via ORAL
  Filled 2022-02-16 (×2): qty 1

## 2022-02-16 MED ORDER — HYDROCHLOROTHIAZIDE 12.5 MG PO TABS
12.5000 mg | ORAL_TABLET | Freq: Every day | ORAL | Status: DC
  Administered 2022-02-16 – 2022-02-17 (×2): 12.5 mg via ORAL
  Filled 2022-02-16 (×2): qty 1

## 2022-02-16 MED ORDER — PAROXETINE HCL 20 MG PO TABS
40.0000 mg | ORAL_TABLET | Freq: Every day | ORAL | Status: DC
  Administered 2022-02-16 – 2022-02-17 (×2): 40 mg via ORAL
  Filled 2022-02-16 (×2): qty 2

## 2022-02-16 MED ORDER — GABAPENTIN 300 MG PO CAPS
600.0000 mg | ORAL_CAPSULE | Freq: Every day | ORAL | Status: DC
  Administered 2022-02-16: 600 mg via ORAL
  Filled 2022-02-16: qty 2

## 2022-02-16 MED ORDER — LOSARTAN POTASSIUM-HCTZ 100-25 MG PO TABS: 0.5000 | ORAL_TABLET | Freq: Every day | ORAL | Status: DC

## 2022-02-16 MED ORDER — BUPROPION HCL ER (SR) 150 MG PO TB12
150.0000 mg | ORAL_TABLET | Freq: Two times a day (BID) | ORAL | Status: DC
  Administered 2022-02-16 – 2022-02-17 (×2): 150 mg via ORAL
  Filled 2022-02-16 (×2): qty 1

## 2022-02-16 MED ORDER — CARVEDILOL 12.5 MG PO TABS
12.5000 mg | ORAL_TABLET | Freq: Two times a day (BID) | ORAL | Status: DC
  Administered 2022-02-16 – 2022-02-17 (×2): 12.5 mg via ORAL
  Filled 2022-02-16 (×2): qty 1

## 2022-02-16 MED ORDER — LOSARTAN POTASSIUM 50 MG PO TABS
50.0000 mg | ORAL_TABLET | Freq: Every day | ORAL | Status: DC
  Administered 2022-02-16 – 2022-02-17 (×2): 50 mg via ORAL
  Filled 2022-02-16 (×2): qty 1

## 2022-02-16 MED ORDER — KETOTIFEN FUMARATE 0.025 % OP SOLN: 1.0000 [drp] | Freq: Two times a day (BID) | OPHTHALMIC | Status: DC | PRN

## 2022-02-16 MED ORDER — IOHEXOL 350 MG/ML SOLN
100.0000 mL | Freq: Once | INTRAVENOUS | Status: AC | PRN
  Administered 2022-02-16: 75 mL via INTRAVENOUS

## 2022-02-16 NOTE — Progress Notes (Signed)
Progress Note   Patient: Chad Hensley [id number] DOB: 27-Aug-1950 DOA: 02/15/2022     0 DOS: the patient was seen and examined on 02/16/2022   Brief hospital admission course: As per H&P written by Dr. Denton Brick on 02/16/2019 Chad Hensley is a 71 y.o. male with medical history significant for hypertension, asthma, PTSD. Patient presented to the ED with complaints of blurred vision and slurred speech.  He reports ongoing symptoms over the past week.  About 3-4 episodes.  He reports most times both the blurry vision and slurred speech occur the same time, with associated headaches at the back of his head and later front of his head.  He reports occasional history of mild/stress headaches, but not like this.  No facial asymmetry, no numbness or abnormal sensation of his extremity, no weakness of his extremities.  He is not on daily aspirin. He also reports over the past 2 weeks his blood pressure has been elevated, up to 123XX123 systolic.    Today while he was at church, he had another episode of blurry vision, and slurred speech- he talked to a church member who did not understand what he was saying.   He also reports a new rash to his face that started today.  Rash was initially mild, so he applied topical antibiotic cream which she has used in the past, but now the rash appears worse.  Blood pressure medication was switched from ACE inhibitor to ARB which he started 5 days ago 10/10 due to reports of cough.  His cough has improved.  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  Assessment and Plan: * Slurred speech -4 - 5 episodes of transient slurred speech with blurry vision and headaches over the past 1 week. -Patient also reported intermittent episode of lightheadedness.  -Adjust antihypertensive agents -Follow CT angio current to rule out intracranial stenosis -Patient has been clear by speech therapy and physical therapy  currently. -No slurred speech appreciated on exam.   -Follow clinical response. -Continue baby aspirin for secondary prevention and risk factor modifications.  Essential hypertension -Meeting criteria for hypertensive urgency, elevated up to 123456 systolic at home, with associated symptoms of slurred speech and blurry vision.  -Patient's ACE-inh recently switched to ARB/HCTZ; reports no good compliance with this new medication.. -After discussing with neurology service okay to pursue normotension as patient most likely experiencing neurologic symptoms associated with hypertensive urgency. -Resume adjusted dose of ARB/HCTZ, low-dose Imdur and Coreg will be provided -Follow orthostatic vital signs -Follow blood pressure -CT angiogram of the head to rule out the presence of intracranial stenosis as per neurology recommendation.   Psoriasis -New rash to face. 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  -Continue to monitor rash. -Hold off on further creams, follow-up with hematology service.  PTSD (post-traumatic stress disorder) -Overall stable -Continue home medication regimen.    Subjective:  No slurred speech; no further episodes of blurred vision currently.  Patient reports no focal neurologic deficits, chest pain, shortness of breath or any other complaints.  Blood pressure continued to be elevated and fluctuating.  Physical Exam: Vitals:   02/15/22 1853 02/15/22 2236 02/16/22 0445 02/16/22 0752  BP: (!) 178/98 135/75 128/82 (!) 162/96  Pulse: 94 65 72 77  Resp: 18 20 20 19   Temp:  98.2 F (36.8 C) 97.9 F (36.6 C) 97.7 F (36.5 C)  TempSrc:  Oral  SpO2: 95% 94% 94% 95%   General exam: Alert, awake, oriented x 3; in no acute distress. Respiratory system: Clear to auscultation. Respiratory effort normal.  Good saturation on room air. Cardiovascular system:RRR. No rubs or gallops; no JVD. Gastrointestinal system: Abdomen is  nondistended, soft and nontender. No organomegaly or masses felt. Normal bowel sounds heard. Central nervous system: Alert and oriented. No focal neurological deficits. Extremities: No cyanosis or clubbing. Skin: No petechiae.  Psychiatry: Judgement and insight appear normal. Mood & affect appropriate.   Data Reviewed: A1c 5.4 Comprehensive metabolic panel sodium 546, potassium 3.5, chloride 107, bicarb 27, BUN 13, creatinine 1.0 and normal LFTs. UDS positive for benzodiazepines (patient with prescription for). Urinalysis not demonstrating the presence of acute infection (isolated mild to moderate pyuria appreciated). Lipid panel: Total cholesterol 131, triglycerides 123, HDL 33, LDL 73 CBC: WBCs 8.4, hemoglobin 16.4 and platelet count 216 K.   Family Communication: No family at bedside.  Disposition: Status is: Observation The patient remains OBS appropriate and will d/c before 2 midnights.   Planned Discharge Destination: Home   Author: Barton Dubois, MD 02/16/2022 1:29 PM  For on call review [email or web address].

## 2022-02-16 NOTE — Progress Notes (Signed)
Speech Language Pathology Evaluation Patient Details Name: Chad Hensley MRN: [id number] DOB: 04-02-1951 Today's Date: 02/16/2022 Time: 0092-3300 SLP Time Calculation (min) (ACUTE ONLY): 19 min  Problem List:  Patient Active Problem List   Diagnosis Date Noted   Slurred speech 02/15/2022   PTSD (post-traumatic stress disorder) 02/15/2022   Psoriasis 02/15/2022   Asthmatic bronchitis , chronic 01/13/2022   Essential hypertension 01/13/2022   Past Medical History:  Past Medical History:  Diagnosis Date   PTSD (post-traumatic stress disorder) 05/04/1976   Past Surgical History: History reviewed. No pertinent surgical history. HPI:  Chad Hensley is a 71 yo male admitted to [facility] due to 4 - 5 episodes of transient slurred speech with blurry vision and headaches over the past 1 week with associated elevated blood pressure systolic up to 762. Currently symptom free. Head Ct- negative for acute abnormality. Not on daily aspirin. MRI brain negative for acute changes. SLE ordered.   Assessment / Plan / Recommendation Clinical Impression  Pt without acute changes in his cogntive linguistic skills, however he does endorse mild short term memory deficits over the past few months and intermittent "slurred speech" which has now resolved. Pt indicates that he has taken his medication twice at times, but has since mitigated the problem by writing on the pill container with Sharpie to indicate AM/PM. He is independent with financial management and driving. His daughter is his primary source of support. No further SLP services indicated at this time. SLP will sign off.    SLP Assessment  SLP Recommendation/Assessment: Patient does not need any further Speech [place] [facility] SLP Visit Diagnosis: Cognitive communication deficit (R41.841)    Recommendations for follow up therapy are one component of a multi-disciplinary discharge planning process, led by the attending physician.   Recommendations may be updated based on patient status, additional functional criteria and insurance authorization.    Follow Up Recommendations  No SLP follow up    Assistance Recommended at Discharge  None  Functional Status Assessment Patient has not had a recent decline in their functional status  Frequency and Duration           SLP Evaluation Cognition  Overall Cognitive Status: Within Functional Limits for tasks assessed Arousal/Alertness: Awake/alert Orientation Level: Oriented X4 Year: 2023 Month: October Day of Week: Correct Memory: Appears intact Awareness:  (Pt reports occasional trouble with medication managment) Problem Solving: Appears intact Safety/Judgment: Appears intact       Comprehension  Auditory Comprehension Overall Auditory Comprehension: Appears within functional limits for tasks assessed Yes/No Questions: Within Functional Limits Commands: Within Functional Limits Conversation: Complex Visual Recognition/Discrimination Discrimination: Within Function Limits Reading Comprehension Reading Status: Not tested    Expression Expression Primary Mode of Expression: Verbal Verbal Expression Overall Verbal Expression: Appears within functional limits for tasks assessed Initiation: No impairment Automatic Speech: Name;Social Response Level of Generative/Spontaneous Verbalization: Conversation Repetition: No impairment Naming: No impairment Pragmatics: No impairment Non-Verbal Means of Communication: Not applicable Written Expression Dominant Hand: Right Written Expression: Not tested   Oral / Motor  Oral Motor/Sensory Function Overall Oral Motor/Sensory Function: Within functional limits Motor Speech Overall Motor Speech: Appears within functional limits for tasks assessed Respiration: Within functional limits Phonation: Normal Resonance: Within functional limits Articulation: Within functional limitis Intelligibility: Intelligible Motor  Planning: Witnin functional limits Motor Speech Errors: Not applicable           Thank you,  Genene Churn, [place]  [place] 02/16/2022, 3:57 PM

## 2022-02-16 NOTE — Plan of Care (Signed)
  Problem: Clinical Measurements: Goal: Diagnostic test results will improve Outcome: Not Progressing   Problem: Clinical Measurements: Goal: Cardiovascular complication will be avoided Outcome: Not Progressing   

## 2022-02-16 NOTE — TOC Progression Note (Signed)
Transition of Care [facility]) - Progression Note    Patient Details  Name: Chad Hensley MRN: [id number] Date of Birth: 03-04-1951  [facility] of [facility] CM/SW Contact  Boneta Lucks, RN Phone Number: 02/16/2022, 12:56 PM  Clinical Narrative:   Patient admitted in OBS for CVA workup. PT has no follow up recommendations.  [place] notification completed # [phone].    Expected Discharge Plan: Home/Self Care Barriers to Discharge: Continued Medical Work up  Expected Discharge Plan and Services Expected Discharge Plan: Home/Self Care

## 2022-02-16 NOTE — Evaluation (Signed)
Physical Therapy Evaluation Patient Details Name: Chad Hensley MRN: [id number] DOB: 05-Jan-1951 Today's Date: 02/16/2022  History of Present Illness  Chad Hensley is a 71 y.o. male with medical history significant for hypertension, asthma, PTSD.  Patient presented to the ED with complaints of blurred vision and slurred speech.  He reports ongoing symptoms over the past week.  About 3-4 episodes.  He reports most times both the blurry vision and slurred speech occur the same time, with associated headaches at the back of his head and later front of his head.  He reports occasional history of mild/stress headaches, but not like this.  No facial asymmetry, no numbness or abnormal sensation of his extremity, no weakness of his extremities.  He is not on daily aspirin.  He also reports over the past 2 weeks his blood pressure has been elevated, up to 190-200 systolic.      Today while he was at church, he had another episode of blurry vision, and slurred speech- he talked to a church member who did not understand what he was saying.     He also reports a new rash to his face that started today.  Rash was initially mild, so he applied topical antibiotic cream which she has used in the past, but now the rash appears worse.  Blood pressure medication was switched from ACE inhibitor to ARB which he started 5 days ago 10/10 due to reports of cough.  His cough has improved.  [facility] involving his scalp but not his face or extremities.   Clinical Impression  Patient functioning at baseline for functional mobility and gait other than c/o mild drifting to the right during ambulation.  Patient able walk in a straight line when focusing and demonstrates good return for ambulating on level, inclined and declined surfaces without loss of balance.  Plan:  Patient discharged from physical therapy to care of nursing for ambulation daily as tolerated for length of stay.          Recommendations for follow up therapy are one component of a multi-disciplinary discharge planning process, led by the attending physician.  Recommendations may be updated based on patient status, additional functional criteria and insurance authorization.  Follow Up Recommendations No PT follow up      Assistance Recommended at Discharge PRN  Patient can return home with the following  Help with stairs or ramp for entrance    Equipment Recommendations None recommended by PT  Recommendations for Other Services       Functional Status Assessment Patient has not had a recent decline in their functional status     Precautions / Restrictions Precautions Precautions: None Restrictions Weight Bearing Restrictions: No      Mobility  Bed Mobility Overal bed mobility: Modified Independent                  Transfers Overall transfer level: Modified independent                      Ambulation/Gait Ambulation/Gait assistance: Modified independent (Device/Increase time) Gait Distance (Feet): 150 Feet Assistive device: None Gait Pattern/deviations: WFL(Within Functional Limits), Drifts right/left Gait velocity: decreased     General Gait Details: grossly WFL other than c/o mild drifting to right initally during ambulation, but able to walk in a straight line without problem when focusing  Careers information officer  Modified Rankin (Stroke Patients Only)       Balance Overall balance assessment: Needs assistance Sitting-balance support: Feet supported, No upper extremity supported Sitting balance-Leahy Scale: Good Sitting balance - Comments: seated at EOB   Standing balance support: No upper extremity supported, During functional activity Standing balance-Leahy Scale: Good Standing balance comment: without AD                             Pertinent Vitals/Pain Pain Assessment Pain Assessment: 0-10 Pain Score: 4   Pain Location: heaches in front and back of head Pain Descriptors / Indicators: Headache Pain Intervention(s): Limited activity within patient's tolerance, Monitored during session    Home Living Family/patient expects to be discharged to:: Private residence Living Arrangements: Alone Available Help at Discharge: Family;Available PRN/intermittently Type of Home: House Home Access: Ramped entrance     Alternate Level Stairs-Number of Steps: 12 steps to basement Home Layout: Two level;Able to live on main level with bedroom/bathroom;Full bath on main level;Laundry or work area in [place]: Kasandra Knudsen - single point;Shower seat;Grab bars - tub/shower      Prior Function Prior Level of Function : Independent/Modified Independent             Mobility Comments: Hydrographic surveyor, drives ADLs Comments: Independent     Hand Dominance        Extremity/Trunk Assessment   Upper Extremity Assessment Upper Extremity Assessment: Overall WFL for tasks assessed    Lower Extremity Assessment Lower Extremity Assessment: Overall WFL for tasks assessed    Cervical / Trunk Assessment Cervical / Trunk Assessment: Normal  Communication   Communication: No difficulties  Cognition Arousal/Alertness: Awake/alert Behavior During Therapy: WFL for tasks assessed/performed Overall Cognitive Status: Within Functional Limits for tasks assessed                                          General Comments      Exercises     Assessment/Plan    PT Assessment Patient does not need any further PT services  PT Problem List         PT Treatment Interventions      PT Goals (Current goals can be found in the Care Plan section)  Acute Rehab PT Goals Patient Stated Goal: return home with family to assist PT Goal Formulation: With patient Time For Goal Achievement: 02/16/22 Potential to Achieve Goals: Good    Frequency       Co-evaluation                AM-PAC PT "6 Clicks" Mobility  Outcome Measure Help needed turning from your back to your side while in a flat bed without using bedrails?: None Help needed moving from lying on your back to sitting on the side of a flat bed without using bedrails?: None Help needed moving to and from a bed to a chair (including a wheelchair)?: None Help needed standing up from a chair using your arms (e.g., wheelchair or bedside chair)?: None Help needed to walk in hospital room?: None Help needed climbing 3-5 steps with a railing? : A Little 6 Click Score: 23    End of Session   Activity Tolerance: Patient tolerated treatment well;Patient limited by fatigue Patient left: in bed;with call bell/phone within reach Nurse Communication: Mobility status PT Visit Diagnosis: Unsteadiness on feet (R26.81);Other  abnormalities of gait and mobility (R26.89);Muscle weakness (generalized) (M62.81)    Time: 4854-6270 PT Time Calculation (min) (ACUTE ONLY): 20 min   Charges:   PT Evaluation $PT Eval Moderate Complexity: 1 Mod PT Treatments $Therapeutic Activity: 8-22 mins        2:08 PM, 02/16/22 Ocie Bob, MPT Physical Therapist with [facility] [phone] office [phone] mobile phone

## 2022-02-16 NOTE — Progress Notes (Addendum)
This nurse was called into pt's room d/t c/o worsening headache. Pt states he has had this headache since Sunday and has worsened after getting MRI. Current BP is 157/104. Administered PRN tylenol and medications as ordered. MD is aware and will continue to monitor pt.

## 2022-02-16 NOTE — Consult Note (Signed)
I connected with  Chad Hensley on 02/16/22 by a video enabled telemedicine application and verified that I am speaking with the correct person using two identifiers.   I discussed the limitations of evaluation and management by telemedicine. The patient expressed understanding and agreed to proceed.  Location of patient: [facility] Location of physician: [facility]   Neurology Consultation Reason for Consult: Blurry vision, slurred speech Referring Physician: Dr Brett Canales  CC: Blurred vision, slurred speech  History is obtained from: Patient, chart review  HPI: Chad Hensley is a 71 y.o. male with past medical history of hypertension who presented with blurry vision and transient throat speech.   Patient states his blood pressure medication was adjusted in the last week or so and since then he has noticed feeling lightheaded and at times blurred vision in both eyes, denies diplopia or loss of vision.  He is also noticed intermittent episodes of slurred speech.  On Sunday he was at church when this happened.  He checked his blood pressure and it was over 190 systolic, states at baseline his blood pressure is usually 135 systolic. He called his daughter who told him to come to the emergency room.  While in the emergency room his blood pressure was 173/109.  Also reports intermittent headache in the bifrontal region as well as occipital region.  Denies ever having a stroke, new focal weakness or numbness (status legs feel numb when he is wearing SCDs but denies any other numbness)  ROS: All other systems reviewed and negative except as noted in the HPI.   Past Medical History:  Diagnosis Date   PTSD (post-traumatic stress disorder) 05/04/1976     Family History  Problem Relation Age of Onset   Hypertension Mother    Hypertension Father    Alpha-1 antitrypsin deficiency Neg Hx    Tuberous sclerosis Neg Hx     Social History:  reports that he quit smoking  about 51 years ago. His smoking use included cigarettes. He started smoking about 52 years ago. He has a 1.00 pack-year smoking history. He does not have any smokeless tobacco history on file. He reports that he does not drink alcohol and does not use drugs.   Medications Prior to Admission  Medication Sig Dispense Refill Last Dose   ALBUTEROL IN Inhale into the lungs as needed.      Artificial Tear Ointment (ARTIFICIAL TEARS) ointment Place into both eyes as needed.      atenolol (TENORMIN) 25 MG tablet Take by mouth daily.      buPROPion (WELLBUTRIN SR) 150 MG 12 hr tablet Take 150 mg by mouth 2 (two) times daily.      carboxymethylcellulose (REFRESH PLUS) 0.5 % SOLN 1 drop 3 (three) times daily as needed.      cetirizine (ZYRTEC) 10 MG tablet Take 10 mg by mouth daily.      Cholecalciferol 2000 UNITS TABS Take by mouth daily.      famotidine (PEPCID) 10 MG tablet Take 40 mg by mouth 2 (two) times daily.      gabapentin (NEURONTIN) 600 MG tablet Take 600 mg by mouth at bedtime.      ketotifen (ZADITOR) 0.025 % ophthalmic solution 1 drop 2 (two) times daily.      losartan-hydrochlorothiazide (HYZAAR) 100-25 MG tablet Take 1 tablet by mouth daily. 30 tablet 11    niacin 500 MG tablet Take 500 mg by mouth at bedtime.      Omega-3 Fatty  Acids (FISH OIL PO) Take by mouth daily.      pantoprazole (PROTONIX) 40 MG tablet Take 40 mg by mouth daily.      PARoxetine (PAXIL) 40 MG tablet Take 40 mg by mouth every morning.      pravastatin (PRAVACHOL) 20 MG tablet Take 20 mg by mouth daily.      ranitidine (ZANTAC) 150 MG tablet Take 150 mg by mouth at bedtime.         Exam: Current vital signs: BP (!) 162/96 (BP Location: Left Arm)   Pulse 77   Temp 97.7 F (36.5 C) (Oral)   Resp 19   SpO2 95%  Vital signs in last 24 hours: Temp:  [97.7 F (36.5 C)-98.2 F (36.8 C)] 97.7 F (36.5 C) (10/16 0752) Pulse Rate:  [65-95] 77 (10/16 0752) Resp:  [14-23] 19 (10/16 0752) BP: (128-178)/(75-109)  162/96 (10/16 0752) SpO2:  [92 %-99 %] 95 % (10/16 0752)   Physical Exam  Constitutional: Appears well-developed and well-nourished.  Psych: Affect appropriate to situation Neuro: AAO x3, cranial nerves II to XII grossly intact, antigravity strength without drift in all 4 extremities, FTN intact bilaterally, to light touch in all 4 extremities but patient  I have reviewed labs in epic and the results pertinent to this consultation are: CBC:  Recent Labs  Lab 02/15/22 1534  WBC 8.4  NEUTROABS 6.2  HGB 16.4  HCT 47.3  MCV 87.9  PLT 361    Basic Metabolic Panel:  Lab Results  Component Value Date   NA 142 02/15/2022   K 3.5 02/15/2022   CO2 27 02/15/2022   GLUCOSE 119 (H) 02/15/2022   BUN 13 02/15/2022   CREATININE 1.00 02/15/2022   CALCIUM 9.8 02/15/2022   GFRNONAA >60 02/15/2022   Lipid Panel:  Lab Results  Component Value Date   LDLCALC 73 02/16/2022   HgbA1c:  Lab Results  Component Value Date   HGBA1C 5.4 02/15/2022   Urine Drug Screen:     Component Value Date/Time   LABOPIA NONE DETECTED 02/15/2022 1735   COCAINSCRNUR NONE DETECTED 02/15/2022 1735   LABBENZ POSITIVE (A) 02/15/2022 1735   AMPHETMU NONE DETECTED 02/15/2022 1735   THCU NONE DETECTED 02/15/2022 1735   LABBARB NONE DETECTED 02/15/2022 1735    Alcohol Level     Component Value Date/Time   ETH <10 02/15/2022 1534     I have reviewed the images obtained: CT head without contrast 02/15/2022: No acute intracranial abnormality.  Chronic microvascular ischemic change and cerebral volume loss.  MRI brain without contrast 02/16/2022: No acute or reversible finding. Mild chronic small-vessel ischemic change of the cerebral hemispheric white matter, less than often seen at this age.  US carotid bilateral 02/16/2022:No significant stenosis of internal carotid arteries.   ASSESSMENT/PLAN: 71 year old male who presented with blurry vision and transient slurred speech as well as headache going on  for the last few days.  Hypertensive urgency -Patient symptoms happened in the setting of elevated blood pressure (systolic 443- 154) -MRI brain did not show any acute stroke.  There was no abrupt onset of symptoms and has been intermittent and fluctuating.  Did not have any other focal neurological symptoms.  Therefore less likely to be ischemic stroke or TIA.   Recommendations: -We will obtain CTA to look for any intracranial stenosis which could be contributing to symptoms although unlikely -Okay to reduce blood pressure with goal normotension -Adjust blood pressure medications to minimize fluctuation in blood pressure because patient also  reports feeling lightheaded at times -We will check orthostatic vital signs -Management of rest of comorbidities per primary team -Discussed plan with Dr. Gwenlyn Perking   Thank you for allowing [place] to participate in the care of this patient. If you have any further questions, please contact  me or neurohospitalist.   Lindie Spruce Epilepsy [facility] neurohospitalist

## 2022-02-16 NOTE — Progress Notes (Signed)
*  PRELIMINARY RESULTS* Echocardiogram 2D Echocardiogram has been performed.  Chad Hensley 02/16/2022, 10:32 AM

## 2022-02-17 DIAGNOSIS — G43909 Migraine, unspecified, not intractable, without status migrainosus: Secondary | ICD-10-CM

## 2022-02-17 DIAGNOSIS — K219 Gastro-esophageal reflux disease without esophagitis: Secondary | ICD-10-CM

## 2022-02-17 DIAGNOSIS — L409 Psoriasis, unspecified: Secondary | ICD-10-CM

## 2022-02-17 DIAGNOSIS — F4310 Post-traumatic stress disorder, unspecified: Secondary | ICD-10-CM | POA: Diagnosis not present

## 2022-02-17 DIAGNOSIS — R4781 Slurred speech: Secondary | ICD-10-CM | POA: Diagnosis not present

## 2022-02-17 DIAGNOSIS — I10 Essential (primary) hypertension: Secondary | ICD-10-CM | POA: Diagnosis not present

## 2022-02-17 DIAGNOSIS — G43809 Other migraine, not intractable, without status migrainosus: Secondary | ICD-10-CM

## 2022-02-17 DIAGNOSIS — I160 Hypertensive urgency: Secondary | ICD-10-CM

## 2022-02-17 MED ORDER — FAMOTIDINE 10 MG PO TABS: 20.0000 mg | ORAL_TABLET | Freq: Every day | ORAL | Status: DC

## 2022-02-17 MED ORDER — CARVEDILOL 12.5 MG PO TABS: 12.5000 mg | ORAL_TABLET | Freq: Two times a day (BID) | ORAL | 2 refills | Status: AC

## 2022-02-17 MED ORDER — ISOSORBIDE MONONITRATE ER 30 MG PO TB24: 30.0000 mg | ORAL_TABLET | Freq: Every day | ORAL | 2 refills | Status: DC

## 2022-02-17 MED ORDER — ACETAMINOPHEN 500 MG PO TABS: 1000.0000 mg | ORAL_TABLET | Freq: Three times a day (TID) | ORAL | Status: DC | PRN

## 2022-02-17 MED ORDER — ASPIRIN 81 MG PO TBEC: 81.0000 mg | DELAYED_RELEASE_TABLET | Freq: Every day | ORAL | 12 refills | Status: DC

## 2022-02-17 MED ORDER — LOSARTAN POTASSIUM-HCTZ 100-25 MG PO TABS: 0.5000 | ORAL_TABLET | Freq: Every day | ORAL | Status: DC

## 2022-02-17 NOTE — Assessment & Plan Note (Signed)
-   As needed Tylenol has been recommended -Patient will resume the use of Neurontin -Outpatient follow-up with neurology service for further evaluation and management of his migraine.

## 2022-02-17 NOTE — Plan of Care (Signed)
  Problem: Education: Goal: Knowledge of General Education information will improve Description: Including pain rating scale, medication(s)/side effects and non-pharmacologic comfort measures Outcome: Adequate for Discharge   Problem: Health Behavior/Discharge Planning: Goal: Ability to manage health-related needs will improve Outcome: Adequate for Discharge   Problem: Clinical Measurements: Goal: Ability to maintain clinical measurements within normal limits will improve Outcome: Adequate for Discharge Goal: Will remain free from infection Outcome: Adequate for Discharge Goal: Diagnostic test results will improve Outcome: Adequate for Discharge Goal: Respiratory complications will improve Outcome: Adequate for Discharge Goal: Cardiovascular complication will be avoided Outcome: Adequate for Discharge   Problem: Activity: Goal: Risk for activity intolerance will decrease Outcome: Adequate for Discharge   Problem: Nutrition: Goal: Adequate nutrition will be maintained Outcome: Adequate for Discharge   Problem: Coping: Goal: Level of anxiety will decrease Outcome: Adequate for Discharge   Problem: Elimination: Goal: Will not experience complications related to bowel motility Outcome: Adequate for Discharge Goal: Will not experience complications related to urinary retention Outcome: Adequate for Discharge   Problem: Pain Managment: Goal: General experience of comfort will improve Outcome: Adequate for Discharge   Problem: Safety: Goal: Ability to remain free from injury will improve Outcome: Adequate for Discharge   Problem: Skin Integrity: Goal: Risk for impaired skin integrity will decrease Outcome: Adequate for Discharge   Problem: Education: Goal: Knowledge of secondary prevention will improve (SELECT ALL) Outcome: Adequate for Discharge Goal: Knowledge of patient specific risk factors will improve (INDIVIDUALIZE FOR PATIENT) Outcome: Adequate for Discharge

## 2022-02-17 NOTE — Plan of Care (Signed)
  Problem: Education: Goal: Knowledge of General Education information will improve Description: Including pain rating scale, medication(s)/side effects and non-pharmacologic comfort measures 02/17/2022 1407 by Otis Peak, RN Outcome: Adequate for Discharge 02/17/2022 1405 by Otis Peak, RN Outcome: Adequate for Discharge   Problem: Health Behavior/Discharge Planning: Goal: Ability to manage health-related needs will improve 02/17/2022 1407 by Otis Peak, RN Outcome: Adequate for Discharge 02/17/2022 1405 by Otis Peak, RN Outcome: Adequate for Discharge   Problem: Clinical Measurements: Goal: Ability to maintain clinical measurements within normal limits will improve 02/17/2022 1407 by Otis Peak, RN Outcome: Adequate for Discharge 02/17/2022 1405 by Otis Peak, RN Outcome: Adequate for Discharge Goal: Will remain free from infection 02/17/2022 1407 by Otis Peak, RN Outcome: Adequate for Discharge 02/17/2022 1405 by Otis Peak, RN Outcome: Adequate for Discharge Goal: Diagnostic test results will improve 02/17/2022 1407 by Otis Peak, RN Outcome: Adequate for Discharge 02/17/2022 1405 by Otis Peak, RN Outcome: Adequate for Discharge Goal: Respiratory complications will improve 02/17/2022 1407 by Otis Peak, RN Outcome: Adequate for Discharge 02/17/2022 1405 by Otis Peak, RN Outcome: Adequate for Discharge Goal: Cardiovascular complication will be avoided 02/17/2022 1407 by Otis Peak, RN Outcome: Adequate for Discharge 02/17/2022 1405 by Otis Peak, RN Outcome: Adequate for Discharge   Problem: Activity: Goal: Risk for activity intolerance will decrease 02/17/2022 1407 by Otis Peak, RN Outcome: Adequate for Discharge 02/17/2022 1405 by Otis Peak, RN Outcome: Adequate for Discharge   Problem: Nutrition: Goal: Adequate nutrition will be  maintained 02/17/2022 1407 by Otis Peak, RN Outcome: Adequate for Discharge 02/17/2022 1405 by Otis Peak, RN Outcome: Adequate for Discharge   Problem: Coping: Goal: Level of anxiety will decrease 02/17/2022 1407 by Otis Peak, RN Outcome: Adequate for Discharge 02/17/2022 1405 by Otis Peak, RN Outcome: Adequate for Discharge   Problem: Elimination: Goal: Will not experience complications related to bowel motility 02/17/2022 1407 by Otis Peak, RN Outcome: Adequate for Discharge 02/17/2022 1405 by Otis Peak, RN Outcome: Adequate for Discharge Goal: Will not experience complications related to urinary retention 02/17/2022 1407 by Otis Peak, RN Outcome: Adequate for Discharge 02/17/2022 1405 by Otis Peak, RN Outcome: Adequate for Discharge   Problem: Pain Managment: Goal: General experience of comfort will improve 02/17/2022 1407 by Otis Peak, RN Outcome: Adequate for Discharge 02/17/2022 1405 by Otis Peak, RN Outcome: Adequate for Discharge   Problem: Safety: Goal: Ability to remain free from injury will improve 02/17/2022 1407 by Otis Peak, RN Outcome: Adequate for Discharge 02/17/2022 1405 by Otis Peak, RN Outcome: Adequate for Discharge   Problem: Skin Integrity: Goal: Risk for impaired skin integrity will decrease 02/17/2022 1407 by Otis Peak, RN Outcome: Adequate for Discharge 02/17/2022 1405 by Otis Peak, RN Outcome: Adequate for Discharge   Problem: Education: Goal: Knowledge of secondary prevention will improve (SELECT ALL) 02/17/2022 1407 by Otis Peak, RN Outcome: Adequate for Discharge 02/17/2022 1405 by Otis Peak, RN Outcome: Adequate for Discharge Goal: Knowledge of patient specific risk factors will improve (INDIVIDUALIZE FOR PATIENT) 02/17/2022 1407 by Otis Peak, RN Outcome: Adequate for Discharge 02/17/2022 1405 by  Otis Peak, RN Outcome: Adequate for Discharge   Problem: Education: Goal: Knowledge of disease or condition will improve Outcome: Adequate for Discharge Goal: Knowledge of secondary prevention will improve (SELECT ALL) Outcome: Adequate for Discharge

## 2022-02-17 NOTE — Discharge Summary (Signed)
Physician Discharge Summary   Patient: Chad Hensley MRN: [id number] DOB: 1950/06/29  Admit date:     02/15/2022  Discharge date: 02/17/22  Discharge Physician: Barton Dubois   PCP: [facility], [facility]   Recommendations at discharge:  Reassess blood pressure and further adjust antihypertensive regimen as needed Repeat basic metabolic panel to follow electrolytes and renal function Make sure patient follow-up with neurology service as instructed.  Discharge Diagnoses: Principal Problem:   Slurred speech Active Problems:   Essential hypertension   PTSD (post-traumatic stress disorder)   Psoriasis   Hypertensive urgency   Gastroesophageal reflux disease   Migraine  Brief Hospital admission Course: As per H&P written by Dr. Denton Brick on 02/16/2019 Chad Hensley is a 71 y.o. male with medical history significant for hypertension, asthma, PTSD. Patient presented to the ED with complaints of blurred vision and slurred speech.  He reports ongoing symptoms over the past week.  About 3-4 episodes.  He reports most times both the blurry vision and slurred speech occur the same time, with associated headaches at the back of his head and later front of his head.  He reports occasional history of mild/stress headaches, but not like this.  No facial asymmetry, no numbness or abnormal sensation of his extremity, no weakness of his extremities.  He is not on daily aspirin. He also reports over the past 2 weeks his blood pressure has been elevated, up to 509-326 systolic.    Today while he was at church, he had another episode of blurry vision, and slurred speech- he talked to a church member who did not understand what he was saying.   He also reports a new rash to his face that started today.  Rash was initially mild, so he applied topical antibiotic cream which she has used in the past, but now the rash appears worse.  Blood pressure medication was switched from ACE inhibitor to ARB which he  started 5 days ago 10/10 due to reports of cough.  His cough has improved.  He sees a dermatologist with the [facility], for psoriasis involving his scalp but not his face or extremities.  Assessment and Plan: * Slurred speech -4 - 5 episodes of transient slurred speech with blurry vision and headaches over the past 1 week. -Patient also reported intermittent episode of lightheadedness.  -Antihypertensive agents has been adjusted. -CT angiogram demonstrated no acute intracranial abnormalities or significant stenosis. -Patient cleared by speech therapy and physical therapy to return home. -Continue risk factor modifications and the use of baby aspirin for secondary prevention.  Essential hypertension -Meeting criteria for hypertensive urgency, elevated up to 712W systolic at home, with associated symptoms of slurred speech and blurry vision.  -Patient's ACE-inh recently switched to ARB/HCTZ; reports no good compliance with this new medication.. -After discussing with neurology service okay to pursue normotension as patient most likely experiencing neurologic symptoms associated with hypertensive urgency. -Resume adjusted dose of ARB/HCTZ, low-dose Imdur and Coreg also provided -negative orthostatic vital signs and overall symptoms improval reported -Patient advised to take medications as prescribed, to follow heart healthy diet and to keep himself well-hydrated. -As part of prophylaxis given concern for TIA he will use aspirin on daily basis and continue the use of Lipitor. -CT angiogram of the head demonstrated no acute intracranial abnormality; no intracranial large vessel occlusion or significant stenosis.   Migraine - As needed Tylenol has been recommended -Patient will resume the use of Neurontin -Outpatient follow-up with neurology service for further evaluation and  management of his migraine.  Gastroesophageal reflux disease - Continue PPI.  Hypertensive urgency - Antihypertensive agents  has been adjusted -Patient advised to follow heart healthy diet and to maintain adequate hydration -Please refer to essential hypertension problem for further details.  Psoriasis -New rash to face. He has psoriasis mostly involving his scalp only, follows with dermatologist at the [facility] off on further creams -Patient has been instructed follow-up with dermatology service.  PTSD (post-traumatic stress disorder) -Overall stable -Continue home medication regimen.   Consultants: Neurology Procedures performed: See below for x-ray reports. Disposition: Discharged home.   Diet recommendation: Heart healthy diet.  DISCHARGE MEDICATION: Allergies as of 02/17/2022       Reactions   Antihistamines, Diphenhydramine-type Palpitations   Codeine Nausea And Vomiting        Medication List     STOP taking these medications    omeprazole 20 MG capsule Commonly known as: PRILOSEC       TAKE these medications    acetaminophen 500 MG tablet Commonly known as: TYLENOL Take 2 tablets (1,000 mg total) by mouth every 8 (eight) hours as needed for headache.   ALBUTEROL IN Inhale into the lungs as needed.   artificial tears ointment Place into both eyes as needed.   aspirin EC 81 MG tablet Take 1 tablet (81 mg total) by mouth daily. Swallow whole. Start taking on: February 18, 2022   atorvastatin 20 MG tablet Commonly known as: LIPITOR Take 20 mg by mouth daily.   buPROPion 150 MG 12 hr tablet Commonly known as: WELLBUTRIN SR Take 150 mg by mouth 2 (two) times daily.   carboxymethylcellulose 0.5 % Soln Commonly known as: REFRESH PLUS 1 drop 3 (three) times daily as needed.   carvedilol 12.5 MG tablet Commonly known as: COREG Take 1 tablet (12.5 mg total) by mouth 2 (two) times daily with a meal.   cetirizine 10 MG tablet Commonly known as: ZYRTEC Take 10 mg by mouth daily.   Cholecalciferol 50 MCG (2000 UT) Tabs Take by mouth daily.   famotidine 10 MG  tablet Commonly known as: PEPCID Take 2 tablets (20 mg total) by mouth at bedtime. What changed:  how much to take when to take this   FISH OIL PO Take 1 capsule by mouth 2 (two) times daily.   gabapentin 600 MG tablet Commonly known as: NEURONTIN Take 600 mg by mouth at bedtime.   isosorbide mononitrate 30 MG 24 hr tablet Commonly known as: IMDUR Take 1 tablet (30 mg total) by mouth daily. Start taking on: February 18, 2022   ketotifen 0.025 % ophthalmic solution Commonly known as: ZADITOR 1 drop 2 (two) times daily.   losartan-hydrochlorothiazide 100-25 MG tablet Commonly known as: Hyzaar Take 0.5 tablets by mouth daily. What changed: how much to take   niacin 500 MG tablet Commonly known as: VITAMIN B3 Take 500 mg by mouth at bedtime.   pantoprazole 40 MG tablet Commonly known as: PROTONIX Take 40 mg by mouth daily.   PARoxetine 40 MG tablet Commonly known as: PAXIL Take 40 mg by mouth every morning.   vitamin B-12 50 MCG tablet Commonly known as: CYANOCOBALAMIN Take 50 mcg by mouth daily.        Follow-up Information     Clinic, [facility]. Schedule an appointment as soon as possible for a visit in 10 day(s).   Contact information: [place] [address] [phone]         Merlene Laughter,  Kofi, MD. Schedule an appointment as soon as possible for a visit in 4 week(s).   Specialty: Neurology Why: evaluation for headaches and TIA follow up. Contact information: [address] [place] [address] [phone]                Discharge Exam: General exam: Alert, awake, oriented x 3; in no acute distress. Respiratory system: Clear to auscultation. Respiratory effort normal.  Good saturation on room air. Cardiovascular system:RRR. No rubs or gallops; no JVD. Gastrointestinal system: Abdomen is nondistended, soft and nontender. No organomegaly or masses felt. Normal bowel sounds heard. Central nervous system: Alert  and oriented. No focal neurological deficits. Extremities: No cyanosis or clubbing. Skin: No petechiae.  Psychiatry: Judgement and insight appear normal. Mood & affect appropriate.   Condition at discharge: Stable and improved.  The results of significant diagnostics from this hospitalization (including imaging, microbiology, ancillary and laboratory) are listed below for reference.   Imaging Studies: CT ANGIO HEAD W OR WO CONTRAST  Result Date: 02/16/2022 CLINICAL DATA:  Hypertension and blurred vision, follow-up CT and MRI EXAM: CT ANGIOGRAPHY HEAD TECHNIQUE: Multidetector CT imaging of the head was performed using the standard protocol during bolus administration of intravenous contrast. Multiplanar CT image reconstructions and MIPs were obtained to evaluate the vascular anatomy. RADIATION DOSE REDUCTION: This exam was performed according to the departmental dose-optimization program which includes automated exposure control, adjustment of the mA and/or kV according to patient size and/or use of iterative reconstruction technique. CONTRAST:  21mL OMNIPAQUE IOHEXOL 350 MG/ML SOLN COMPARISON:  CT head 02/15/2022.  No prior CTA FINDINGS: CT HEAD Brain: No evidence of acute infarct, hemorrhage, mass, mass effect, or midline shift. No hydrocephalus or extra-axial fluid collection. Vascular: No hyperdense vessel. Skull: Normal. Negative for fracture or focal lesion. Sinuses/Orbits: No acute finding. Other: The mastoid air cells are well aerated. CTA HEAD FINDINGS Anterior circulation: Both internal carotid arteries are patent to the termini, with scattered calcifications but without significant stenosis. A1 segments patent. Normal anterior communicating artery. Anterior cerebral arteries are patent to their distal aspects. No M1 stenosis or occlusion. MCA branches perfused and symmetric. Posterior circulation: Vertebral arteries patent to the vertebrobasilar junction without stenosis. Posterior inferior  cerebellar arteries patent proximally. Basilar patent to its distal aspect. Superior cerebellar arteries patent proximally. Patent P1 segments, diminutive on the right. Near fetal origin of the right PCA from a prominent right posterior communicating artery. PCAs perfused to their distal aspects without stenosis. The left posterior communicating artery is not visualized. Venous sinuses: As permitted by contrast timing, patent. Anatomic variants: None significant. Review of the MIP images confirms the above findings IMPRESSION: 1. No acute intracranial abnormality. 2. No intracranial large vessel occlusion or significant stenosis. Electronically Signed   By: Merilyn Baba M.D.   On: 02/16/2022 16:01   US Carotid Bilateral  Result Date: 02/16/2022 CLINICAL DATA:  TIA Dysarthria Blurry vision for 1 week Hypertension EXAM: BILATERAL CAROTID DUPLEX ULTRASOUND TECHNIQUE: Pearline Cables scale imaging, color Doppler and duplex ultrasound were performed of bilateral carotid and vertebral arteries in the neck. COMPARISON:  None available FINDINGS: Criteria: Quantification of carotid stenosis is based on velocity parameters that correlate the residual internal carotid diameter with NASCET-based stenosis levels, using the diameter of the distal internal carotid lumen as the denominator for stenosis measurement. The following velocity measurements were obtained: RIGHT ICA: 87/20 cm/sec CCA: [id number] cm/sec SYSTOLIC ICA/CCA RATIO:  1.0 ECA: 99 cm/sec LEFT ICA: 112/27 cm/sec CCA: [id number] cm/sec SYSTOLIC ICA/CCA RATIO:  1.5 ECA: 74 cm/sec RIGHT CAROTID ARTERY: Mild heterogeneous plaque in the carotid bifurcation. RIGHT VERTEBRAL ARTERY:  Antegrade flow. LEFT CAROTID ARTERY: Mild heterogeneous plaque of the carotid bifurcation. LEFT VERTEBRAL ARTERY:  Antegrade flow. IMPRESSION: No significant stenosis of internal carotid arteries. Electronically Signed   By: Miachel Roux M.D.   On: 02/16/2022 11:36   ECHOCARDIOGRAM COMPLETE  Result Date:  02/16/2022    ECHOCARDIOGRAM REPORT   Patient Name:   Chad Hensley [place] Date of Exam: 02/16/2022 Medical Rec #:  [id number]    Height:       67.0 in Accession #:    [id number]   Weight:       176.0 lb Date of Birth:  11-23-1950    BSA:          1.915 m Patient Age:    29 years     BP:           162/96 mmHg Patient Gender: M            HR:           77 bpm. Exam Location:  [facility] Procedure: 2D Echo, Cardiac Doppler and Color Doppler Indications:    TIA G45.9  History:        Patient has no prior history of Echocardiogram examinations.                 Risk Factors:Hypertension.  Sonographer:    Alvino Chapel RCS Referring Phys: [phone] [place]  1. Left ventricular ejection fraction, by estimation, is 60 to 65%. The left ventricle has normal function. The left ventricle has no regional wall motion abnormalities. Left ventricular diastolic parameters were normal.  2. Right ventricular systolic function is normal. The right ventricular size is normal. Tricuspid regurgitation signal is inadequate for assessing PA pressure.  3. The mitral valve is grossly normal. Trivial mitral valve regurgitation. No evidence of mitral stenosis.  4. The aortic valve is tricuspid. Aortic valve regurgitation is trivial. No aortic stenosis is present.  5. The inferior vena cava is normal in size with greater than 50% respiratory variability, suggesting right atrial pressure of 3 mmHg. Conclusion(s)/Recommendation(s): No intracardiac source of embolism detected on this transthoracic study. Consider a transesophageal echocardiogram to exclude cardiac source of embolism if clinically indicated. FINDINGS  Left Ventricle: Left ventricular ejection fraction, by estimation, is 60 to 65%. The left ventricle has normal function. The left ventricle has no regional wall motion abnormalities. The left ventricular internal cavity size was normal in size. There is  no left ventricular hypertrophy. Left ventricular diastolic parameters  were normal. Right Ventricle: The right ventricular size is normal. No increase in right ventricular wall thickness. Right ventricular systolic function is normal. Tricuspid regurgitation signal is inadequate for assessing PA pressure. Left Atrium: Left atrial size was normal in size. Right Atrium: Right atrial size was normal in size. Pericardium: There is no evidence of pericardial effusion. Mitral Valve: The mitral valve is grossly normal. Trivial mitral valve regurgitation. No evidence of mitral valve stenosis. Tricuspid Valve: The tricuspid valve is grossly normal. Tricuspid valve regurgitation is not demonstrated. No evidence of tricuspid stenosis. Aortic Valve: The aortic valve is tricuspid. Aortic valve regurgitation is trivial. No aortic stenosis is present. Pulmonic Valve: The pulmonic valve was grossly normal. Pulmonic valve regurgitation is not visualized. No evidence of pulmonic stenosis. Aorta: The aortic root is normal in size and structure. Venous: The inferior vena cava is normal in size with greater than  50% respiratory variability, suggesting right atrial pressure of 3 mmHg. IAS/Shunts: The atrial septum is grossly normal.  LEFT VENTRICLE PLAX 2D LVIDd:         5.20 cm   Diastology LVIDs:         3.50 cm   LV e' medial:    7.51 cm/s LV PW:         1.00 cm   LV E/e' medial:  9.8 LV IVS:        1.00 cm   LV e' lateral:   10.70 cm/s LVOT diam:     1.90 cm   LV E/e' lateral: 6.9 LV SV:         56 LV SV Index:   29 LVOT Area:     2.84 cm  RIGHT VENTRICLE RV S prime:     16.30 cm/s TAPSE (M-mode): 3.0 cm LEFT ATRIUM             Index        RIGHT ATRIUM           Index LA diam:        3.50 cm 1.83 cm/m   RA Area:     15.00 cm LA Vol (A2C):   49.5 ml 25.84 ml/m  RA Volume:   41.40 ml  21.62 ml/m LA Vol (A4C):   53.7 ml 28.04 ml/m LA Biplane Vol: 52.8 ml 27.57 ml/m  AORTIC VALVE LVOT Vmax:   109.00 cm/s LVOT Vmean:  67.400 cm/s LVOT VTI:    0.198 m  AORTA Ao Root diam: 3.20 cm MITRAL VALVE MV Area  (PHT): 3.77 cm    SHUNTS MV Decel Time: 201 msec    Systemic VTI:  0.20 m MV E velocity: 73.50 cm/s  Systemic Diam: 1.90 cm MV A velocity: 67.90 cm/s MV E/A ratio:  1.08 Eleonore Chiquito MD Electronically signed by Eleonore Chiquito MD Signature Date/Time: 02/16/2022/10:44:08 AM    Final    MR BRAIN WO CONTRAST  Result Date: 02/16/2022 CLINICAL DATA:  Transient ischemic attack. Episodes of slurred speech and blurred vision over the last week. EXAM: MRI HEAD WITHOUT CONTRAST TECHNIQUE: Multiplanar, multiecho pulse sequences of the brain and surrounding structures were obtained without intravenous contrast. COMPARISON:  Head CT 02/15/2022 FINDINGS: Brain: Diffusion imaging does not show any acute or subacute infarction. Brainstem and cerebellum are normal. Cerebral hemispheres show mild chronic small-vessel ischemic changes of the deep and subcortical white matter, less than often seen at this age. No large vessel territory infarction. No mass lesion, hemorrhage, hydrocephalus or extra-axial collection. Vascular: Major vessels at the base of the brain show flow. Skull and upper cervical spine: Negative Sinuses/Orbits: Clear/normal Other: None IMPRESSION: No acute or reversible finding. Mild chronic small-vessel ischemic change of the cerebral hemispheric white matter, less than often seen at this age. Electronically Signed   By: Nelson Chimes M.D.   On: 02/16/2022 08:41   CT HEAD WO CONTRAST  Result Date: 02/15/2022 CLINICAL DATA:  Neuro deficit, acute, stroke suspected EXAM: CT HEAD WITHOUT CONTRAST TECHNIQUE: Contiguous axial images were obtained from the base of the skull through the vertex without intravenous contrast. RADIATION DOSE REDUCTION: This exam was performed according to the departmental dose-optimization program which includes automated exposure control, adjustment of the mA and/or kV according to patient size and/or use of iterative reconstruction technique. COMPARISON:  None Available. FINDINGS:  Brain: No evidence of acute infarction, hemorrhage, hydrocephalus, extra-axial collection or mass lesion/mass effect. Scattered low-density changes within the periventricular  and subcortical white matter compatible with chronic microvascular ischemic change. Mild diffuse cerebral volume loss. Vascular: No hyperdense vessel or unexpected calcification. Skull: Normal. Negative for fracture or focal lesion. Sinuses/Orbits: No acute finding. Other: None. IMPRESSION: 1. No acute intracranial abnormality. 2. Chronic microvascular ischemic change and cerebral volume loss. Electronically Signed   By: Davina Poke D.O.   On: 02/15/2022 15:58    Microbiology: No results found for this or any previous visit.  Labs: CBC: Recent Labs  Lab 02/15/22 1534  WBC 8.4  NEUTROABS 6.2  HGB 16.4  HCT 47.3  MCV 87.9  PLT 123XX123   Basic Metabolic Panel: Recent Labs  Lab 02/15/22 1534  NA 142  K 3.5  CL 107  CO2 27  GLUCOSE 119*  BUN 13  CREATININE 1.00  CALCIUM 9.8   Liver Function Tests: Recent Labs  Lab 02/15/22 1534  AST 34  ALT 33  ALKPHOS 90  BILITOT 1.2  PROT 7.3  ALBUMIN 4.5   CBG: No results for input(s): "GLUCAP" in the last 168 hours.  Discharge time spent: greater than 30 minutes.  Signed: Barton Dubois, MD [facility] 02/17/2022

## 2022-02-17 NOTE — Progress Notes (Signed)
Pt has rested well tonight.  Modified stroke assessments negative.  Pt SR/SB on monitor.  Denies pain.  Remains on room air.  Continue to monitor.

## 2022-02-17 NOTE — Assessment & Plan Note (Signed)
-   Antihypertensive agents has been adjusted -Patient advised to follow heart healthy diet and to maintain adequate hydration -Please refer to essential hypertension problem for further details.

## 2022-02-17 NOTE — Assessment & Plan Note (Signed)
Continue PPI ?

## 2022-02-25 ENCOUNTER — Ambulatory Visit: Payer: No Typology Code available for payment source | Admitting: Internal Medicine

## 2022-03-12 ENCOUNTER — Encounter: Payer: Self-pay | Admitting: Neurology

## 2022-03-16 ENCOUNTER — Encounter: Payer: Self-pay | Admitting: Neurology

## 2022-05-26 ENCOUNTER — Ambulatory Visit (INDEPENDENT_AMBULATORY_CARE_PROVIDER_SITE_OTHER): Payer: No Typology Code available for payment source | Admitting: Internal Medicine

## 2022-05-26 ENCOUNTER — Encounter: Payer: Self-pay | Admitting: Internal Medicine

## 2022-05-26 VITALS — BP 126/72 | HR 86 | Temp 98.2°F | Ht 66.5 in | Wt 169.0 lb

## 2022-05-26 DIAGNOSIS — R0609 Other forms of dyspnea: Secondary | ICD-10-CM

## 2022-05-26 DIAGNOSIS — J4489 Other specified chronic obstructive pulmonary disease: Secondary | ICD-10-CM

## 2022-05-26 DIAGNOSIS — I10 Essential (primary) hypertension: Secondary | ICD-10-CM

## 2022-05-26 NOTE — Progress Notes (Signed)
PFT done today. 

## 2022-05-26 NOTE — Patient Instructions (Signed)
No change for now.  Please schedule a follow up visit in 3 months but call sooner if needed  with all medications /inhalers/ solutions in hand so we can verify exactly what you are taking. This includes all medications from all doctors and over the counters - also bring [place].

## 2022-05-26 NOTE — Assessment & Plan Note (Signed)
Onset was around 2020 assoc with nasal congestion / exp to mold since 2016  - PFT's  05/28/21  FEV1 1.86 (68 % ) ratio 0.41  P13%  % improvement from saba p ? prior to study with DLCO  11.80 (48.5 %  and FV curve classic concavity    - Labs ordered 01/13/2022  :  allergy screen  Eos 0.4/ IgE <2    alpha one AT phenotype  MM 154  - 01/13/2022 rec max GERD rx/ off ACEi and approp saba  PFT's  05/26/2022   FEV1 1.69 (61 % ) ratio 0.44  p 20 % improvement from saba p wixella prior to study with DLCO  11.07 (48%)   and FV curve classically concave and ERV 11.07 at wt 48%    Moderate AB minimally symptomatic without flares but def needs more options for maint rx   Advised:  formulary restrictions thru [place] will be an ongoing challenge for the forseable future and I would be happy to pick an alternative if the pt will first  provide me a list of them -  pt  will need to return here for training for any new device that is required eg dpi vs hfa vs respimat.    In the meantime we can always provide samples so that the patient never runs out of any needed respiratory medications.           Each maintenance medication was reviewed in detail including emphasizing most importantly the difference between maintenance and prns and under what circumstances the prns are to be triggered using an action plan format where appropriate.  Total time for H and P, chart review, counseling, reviewing dpi/hfa device(s) and generating customized AVS unique to this office visit / same day charting = 31 min summary f/u ov

## 2022-05-26 NOTE — Progress Notes (Unsigned)
Chad Hensley, male    DOB: Nov 07, 1950    MRN: [id number]   Brief patient profile:  59  yowm [place] vet/ minimal smoking hx/MM   paint fume issues for 21 y and fine until onset of seasonal rhinitis spring since 2020  referred to pulmonary clinic in [place]  01/13/2022 by [facility]  for chronic cough and doe    History of Present Illness  01/13/2022  Pulmonary/ 1st office eval/ Terina Mcelhinny / [place] on wixella / ACEi  Chief Complaint  Patient presents with   Consult    He reports he felt like he had the flu about 2-3 weeks ago, He had a slight fever no covid, He has some shortness of breath with exertion.  Dyspnea:  can't walk downtown struggles to back to house x 4 blocks slt uphill  Cough: dry, daytime  Sleep: no noct cough  SABA use: couple times day p ex never pre or rechallenges Rec Stop benazapril  Start losartan 100- 12.5 one daily  Only use your albuterol as a rescue medication  Ok to try albuterol 15 min before an activity (on alternating days)  that you know would usually make you short of breath  Please schedule a follow up office visit in 6 weeks, call sooner if needed PFTs next available - need inhalers and your drug  [facility]ul[facility] for [facility]    05/26/2022  f/u ov/Tanasha Menees re: AB   maint on wixella one bid   Chief Complaint  Patient presents with   Follow-up    Doing well.  Review PFT today.  Patient has lost 125 lbs.  Dyspnea:  up hills on daily walk does fine  Cough: none  Sleeping: flat bed 2 pillows  SABA use: none  02: none      No obvious day to day or daytime variability or assoc excess/ purulent sputum or mucus plugs or hemoptysis or cp or chest tightness, subjective wheeze or overt sinus or hb symptoms.   Sleeping  without nocturnal  or early am exacerbation  of respiratory  c/o's or need for noct saba. Also denies any obvious fluctuation of symptoms with weather or environmental changes or other aggravating or alleviating factors  except as outlined above   No unusual exposure hx or h/o childhood pna/ asthma or knowledge of premature birth.  Current Allergies, Complete Past Medical History, Past Surgical History, Family History, and Social History were reviewed in [employer] record.  ROS  The following are not active complaints unless bolded Hoarseness, sore throat, dysphagia, dental problems, itching, sneezing,  nasal congestion or discharge of excess mucus or purulent secretions, ear ache,   fever, chills, sweats, unintended wt loss or wt gain, classically pleuritic or exertional cp,  orthopnea pnd or arm/hand swelling  or leg swelling, presyncope, palpitations, abdominal pain, anorexia, nausea, vomiting, diarrhea  or change in bowel habits or change in bladder habits, change in stools or change in urine, dysuria, hematuria,  rash, arthralgias, visual complaints, headache, numbness, weakness or ataxia or problems with walking or coordination,  change in mood or  memory.        Current Meds  Medication Sig   acetaminophen (TYLENOL) 500 MG tablet Take 2 tablets (1,000 mg total) by mouth every 8 (eight) hours as needed for headache.   ALBUTEROL IN Inhale into the lungs as needed.   Artificial Tear Ointment (ARTIFICIAL TEARS) ointment Place into both eyes as needed.   aspirin EC 81  MG tablet Take 1 tablet (81 mg total) by mouth daily. Swallow whole.   atorvastatin (LIPITOR) 20 MG tablet Take 20 mg by mouth daily.   buPROPion (WELLBUTRIN SR) 150 MG 12 hr tablet Take 150 mg by mouth 2 (two) times daily.   carboxymethylcellulose (REFRESH PLUS) 0.5 % SOLN 1 drop 3 (three) times daily as needed.   carvedilol (COREG) 12.5 MG tablet Take 1 tablet (12.5 mg total) by mouth 2 (two) times daily with a meal.   cetirizine (ZYRTEC) 10 MG tablet Take 10 mg by mouth daily.   Cholecalciferol 2000 UNITS TABS Take by mouth daily.   famotidine (PEPCID) 10 MG tablet Take 2 tablets (20 mg total) by mouth at bedtime.    fluticasone-salmeterol (WIXELA INHUB) 250-50 MCG/ACT AEPB Inhale 1 puff into the lungs in the morning and at bedtime.   gabapentin (NEURONTIN) 600 MG tablet Take 600 mg by mouth at bedtime.   isosorbide mononitrate (IMDUR) 30 MG 24 hr tablet Take 1 tablet (30 mg total) by mouth daily.   ketotifen (ZADITOR) 0.025 % ophthalmic solution 1 drop 2 (two) times daily.   losartan-hydrochlorothiazide (HYZAAR) 100-25 MG tablet Take 0.5 tablets by mouth daily.   niacin 500 MG tablet Take 500 mg by mouth at bedtime.   Omega-3 Fatty Acids (FISH OIL PO) Take 1 capsule by mouth 2 (two) times daily.   pantoprazole (PROTONIX) 40 MG tablet Take 40 mg by mouth daily.   PARoxetine (PAXIL) 40 MG tablet Take 40 mg by mouth every morning.   vitamin B-12 (CYANOCOBALAMIN) 50 MCG tablet Take 50 mcg by mouth daily.         Past Medical History:  Diagnosis Date   PTSD (post-traumatic stress disorder) 05/04/1976       Objective:     Wt Readings from Last 3 Encounters:  05/26/22 169 lb (76.7 kg)  01/13/22 176 lb (79.8 kg)  01/31/14 200 lb (90.7 kg)      Vital signs reviewed  05/26/2022  - Note at rest 02 sats  94% on RA   General appearance:    amb slt hoarse wm   freq throat clearing     HEENT : Oropharynx  clear     NECK :  without  apparent JVD/ palpable Nodes/TM    LUNGS: no acc muscle use,  Min barrel  contour chest wall with bilateral  slightly decreased bs s audible wheeze and  without cough on insp or exp maneuvers and min  Hyperresonant  to  percussion bilaterally    CV:  RRR  no s3 or murmur or increase in P2, and no edema   ABD:  soft and nontender   MS:  Nl gait/ ext warm without deformities Or obvious joint restrictions  calf tenderness, cyanosis or clubbing     SKIN: warm and dry without lesions    NEURO:  alert, approp, nl sensorium with  no motor or cerebellar deficits apparent.                  Assessment

## 2022-05-27 LAB — PULMONARY FUNCTION TEST
DL/VA % pred: 42 %
DL/VA: 1.75 ml/min/mmHg/L
DLCO cor % pred: 48 %
DLCO cor: 11.07 ml/min/mmHg
DLCO unc % pred: 48 %
DLCO unc: 11.07 ml/min/mmHg
FEF 25-75 Post: 0.94 L/sec
FEF 25-75 Pre: 0.58 L/sec
FEF2575-%Change-Post: 62 %
FEF2575-%Pred-Post: 45 %
FEF2575-%Pred-Pre: 28 %
FEV1-%Change-Post: 20 %
FEV1-%Pred-Post: 61 %
FEV1-%Pred-Pre: 51 %
FEV1-Post: 1.69 L
FEV1-Pre: 1.41 L
FEV1FVC-%Change-Post: 3 %
FEV1FVC-%Pred-Pre: 57 %
FEV6-%Change-Post: 16 %
FEV6-%Pred-Post: 103 %
FEV6-%Pred-Pre: 88 %
FEV6-Post: 3.65 L
FEV6-Pre: 3.12 L
FEV6FVC-%Change-Post: 1 %
FEV6FVC-%Pred-Post: 102 %
FEV6FVC-%Pred-Pre: 100 %
FVC-%Change-Post: 15 %
FVC-%Pred-Post: 101 %
FVC-%Pred-Pre: 88 %
FVC-Post: 3.85 L
Post FEV1/FVC ratio: 44 %
Post FEV6/FVC ratio: 96 %
Pre FEV1/FVC ratio: 42 %
Pre FEV6/FVC Ratio: 94 %
RV % pred: 159 %
RV: 3.67 L
TLC % pred: 116 %
TLC: 7.39 L

## 2022-06-04 NOTE — Progress Notes (Signed)
NEUROLOGY CONSULTATION NOTE  Chad Hensley MRN: [id number] DOB: [birth year]  Referring provider: Lorelei Pont, MD Primary care provider: [facility]  Reason for consult:  headache  Assessment/Plan:   Hypertensive emergency.  I do not suspect TIA. Occipital headaches, stable Hypertension  1  Normotensive blood pressure.  Follow up with PCP regarding elevated blood pressure 2  Taking Aleve for his headaches.  Tylenol ineffective, as he is already on ASA, PCP may want to consider trying a muscle relaxant.  Otherwise, an Aleve would be okay as long as it is used infrequently.   3  Limit use of pain relievers to no more than 2 days out of week to prevent risk of rebound or medication-overuse headache. 4  Follow up as needed     Subjective:  Chad Hensley is a 72 year old right-sided male with HTN, COPD, psoriasis, asthma and PTSD who presents for headache and TIA.  History supplemented by hospital records and referring provider's note. CT/CTA head and MRI brain personally reviewed.  Across back of head pounding = sometimes nausea, photophob phono, little blurred vision - 212/178 Slurred speech and lose train of tought and stops talking -  Usually 120s-130s/70s  Headaches improved -last several hours to a a day one every other week, Aleve.  Tylenol ineffective  He has a remote history of migraines.  About 2 years ago, he fell backwards striking the back of his head on cinder blocks.  Since then, he has had occipital headaches, sometimes pounding and associated with photophobia, phonophobia, mild blurred vision and sometimes nausea.  They occur once in awhile.    In early October, he started experiencing dizziness.  He started experiencing his occipital headaches which were now frequent and severe.  On 10/15, he checked his blood pressure which was reportedly 212/178.  He reportedly had been noncompliant with his blood pressure medication.  He was treated for high blood  pressure en route to the hospital.  In the ED, his blood pressure was 173/109.  He exhibited some slurred speech but no facial droop or unilateral numbness or weakness.  CT head showed no acute intracranial abnormality.  MRI of brain revealed very mild chronic small vessel ischemic changes in the cerebral white matter bilaterally but no acute findings.  CTA head showed no LVO, hemodynamically significant stenosis or aneurysm.  Carotid ultrasound revealed no hemodynamically significant stenosis.  TTE revealed LVEF 60-65% without evidence of IAS/shunt.  LDL was 73 and Hgb A1c was 5.4.  He had been taking atorvastatin but was not on antithrombotic therapy.  He was started on ASA 81mg  and continued on atorvastatin with adjustment to his antihypertensive medications.    Since then, his headaches have been manageable and back to baseline.  He has a mild headache about once every 2 weeks.  He is monitoring his blood pressure with his PCP.    Current NSAIDs/analgesics:  ASA 81mg  Current antihypertensive: carvedilol 12.5mg  BID, isosorbide mononitrate, losartan-HCTZ Current antidepressant:  paroxetine 40mg , bupropion 300mg  Current antiepileptic:  gabapentin 600mg  QHS  Past triptans: sumatriptan Past antihypertensive:  atenolol  PAST MEDICAL HISTORY: Past Medical History:  Diagnosis Date   PTSD (post-traumatic stress disorder) 05/04/1976    PAST SURGICAL HISTORY: No past surgical history on file.  MEDICATIONS: Current Outpatient Medications on File Prior to Visit  Medication Sig Dispense Refill   acetaminophen (TYLENOL) 500 MG tablet Take 2 tablets (1,000 mg total) by mouth every 8 (eight) hours as needed for headache.  ALBUTEROL IN Inhale into the lungs as needed.     Artificial Tear Ointment (ARTIFICIAL TEARS) ointment Place into both eyes as needed.     aspirin EC 81 MG tablet Take 1 tablet (81 mg total) by mouth daily. Swallow whole. 30 tablet 12   atorvastatin (LIPITOR) 20 MG tablet Take  20 mg by mouth daily.     buPROPion (WELLBUTRIN SR) 150 MG 12 hr tablet Take 150 mg by mouth 2 (two) times daily.     carboxymethylcellulose (REFRESH PLUS) 0.5 % SOLN 1 drop 3 (three) times daily as needed.     carvedilol (COREG) 12.5 MG tablet Take 1 tablet (12.5 mg total) by mouth 2 (two) times daily with a meal. 60 tablet 2   cetirizine (ZYRTEC) 10 MG tablet Take 10 mg by mouth daily.     Cholecalciferol 2000 UNITS TABS Take by mouth daily.     famotidine (PEPCID) 10 MG tablet Take 2 tablets (20 mg total) by mouth at bedtime.     fluticasone-salmeterol (WIXELA INHUB) 250-50 MCG/ACT AEPB Inhale 1 puff into the lungs in the morning and at bedtime.     gabapentin (NEURONTIN) 600 MG tablet Take 600 mg by mouth at bedtime.     isosorbide mononitrate (IMDUR) 30 MG 24 hr tablet Take 1 tablet (30 mg total) by mouth daily. 30 tablet 2   ketotifen (ZADITOR) 0.025 % ophthalmic solution 1 drop 2 (two) times daily.     losartan-hydrochlorothiazide (HYZAAR) 100-25 MG tablet Take 0.5 tablets by mouth daily.     niacin 500 MG tablet Take 500 mg by mouth at bedtime.     Omega-3 Fatty Acids (FISH OIL PO) Take 1 capsule by mouth 2 (two) times daily.     pantoprazole (PROTONIX) 40 MG tablet Take 40 mg by mouth daily.     PARoxetine (PAXIL) 40 MG tablet Take 40 mg by mouth every morning.     vitamin B-12 (CYANOCOBALAMIN) 50 MCG tablet Take 50 mcg by mouth daily.     No current facility-administered medications on file prior to visit.    ALLERGIES: Allergies  Allergen Reactions   Antihistamines, Diphenhydramine-Type Palpitations   Codeine Nausea And Vomiting    FAMILY HISTORY: Family History  Problem Relation Age of Onset   Hypertension Mother    Hypertension Father    Alpha-1 antitrypsin deficiency Neg Hx    Tuberous sclerosis Neg Hx     Objective:  Blood pressure (!) 163/91, pulse 63, resp. rate 18, height 5' 6.5" (1.689 m), weight 169 lb (76.7 kg), SpO2 95 %. General: No acute distress.   Patient appears well-groomed.   Head:  Normocephalic/atraumatic Eyes:  fundi examined but not visualized Neck: supple, no paraspinal tenderness, full range of motion Back: No paraspinal tenderness Heart: regular rate and rhythm Lungs: Clear to auscultation bilaterally. Vascular: No carotid bruits. Neurological Exam: Mental status: alert and oriented to person, place, and time, speech fluent and not dysarthric, language intact. Cranial nerves: CN I: not tested CN II: pupils equal, round and reactive to light, visual fields intact CN III, IV, VI:  full range of motion, no nystagmus, no ptosis CN V: facial sensation intact. CN VII: upper and lower face symmetric CN VIII: hearing intact CN IX, X: gag intact, uvula midline CN XI: sternocleidomastoid and trapezius muscles intact CN XII: tongue midline Bulk & Tone: normal, no fasciculations. Motor:  muscle strength 5/5 throughout Sensation:  Pinprick sensation intact; vibratory sensation reduced in toes Deep Tendon Reflexes:  3+ both  patellars, otherwise 2+ throughout,  toes downgoing.   Finger to nose testing:  Without dysmetria.   Heel to shin:  Without dysmetria.   Gait:  Normal station and stride.  Romberg negative.    Thank you for allowing me to take part in the care of this patient.  Metta Clines, DO  CC: [facility]

## 2022-06-05 ENCOUNTER — Ambulatory Visit (INDEPENDENT_AMBULATORY_CARE_PROVIDER_SITE_OTHER): Payer: No Typology Code available for payment source | Admitting: Neurology

## 2022-06-05 ENCOUNTER — Encounter: Payer: Self-pay | Admitting: Neurology

## 2022-06-05 VITALS — BP 163/91 | HR 63 | Resp 18 | Ht 66.5 in | Wt 169.0 lb

## 2022-06-05 DIAGNOSIS — I10 Essential (primary) hypertension: Secondary | ICD-10-CM | POA: Diagnosis not present

## 2022-06-05 DIAGNOSIS — R519 Headache, unspecified: Secondary | ICD-10-CM

## 2022-06-05 DIAGNOSIS — I161 Hypertensive emergency: Secondary | ICD-10-CM | POA: Diagnosis not present

## 2022-06-05 NOTE — Patient Instructions (Signed)
Follow up with your PCP regarding blood pressure Limit use of pain relievers to no more than 2 days out of week to prevent risk of rebound or medication-overuse headache. Follow up as needed

## 2022-06-15 ENCOUNTER — Encounter: Payer: Self-pay | Admitting: Neurology

## 2022-08-19 ENCOUNTER — Inpatient Hospital Stay (HOSPITAL_COMMUNITY)
Admission: EM | Admit: 2022-08-19 | Discharge: 2022-08-22 | DRG: 202 | Disposition: A | Discharge status: Home / Self Care | Payer: No Typology Code available for payment source | Attending: Family Medicine | Admitting: Family Medicine

## 2022-08-19 ENCOUNTER — Other Ambulatory Visit: Payer: Self-pay

## 2022-08-19 ENCOUNTER — Emergency Department (HOSPITAL_COMMUNITY): Payer: No Typology Code available for payment source

## 2022-08-19 ENCOUNTER — Encounter (HOSPITAL_COMMUNITY): Payer: Self-pay | Admitting: *Deleted

## 2022-08-19 DIAGNOSIS — Z8249 Family history of ischemic heart disease and other diseases of the circulatory system: Secondary | ICD-10-CM

## 2022-08-19 DIAGNOSIS — J45901 Unspecified asthma with (acute) exacerbation: Secondary | ICD-10-CM | POA: Diagnosis not present

## 2022-08-19 DIAGNOSIS — I160 Hypertensive urgency: Secondary | ICD-10-CM | POA: Diagnosis present

## 2022-08-19 DIAGNOSIS — I5033 Acute on chronic diastolic (congestive) heart failure: Secondary | ICD-10-CM | POA: Diagnosis present

## 2022-08-19 DIAGNOSIS — J4541 Moderate persistent asthma with (acute) exacerbation: Principal | ICD-10-CM | POA: Diagnosis present

## 2022-08-19 DIAGNOSIS — Z1152 Encounter for screening for COVID-19: Secondary | ICD-10-CM

## 2022-08-19 DIAGNOSIS — I110 Hypertensive heart disease with heart failure: Secondary | ICD-10-CM | POA: Diagnosis present

## 2022-08-19 DIAGNOSIS — D72829 Elevated white blood cell count, unspecified: Secondary | ICD-10-CM | POA: Diagnosis present

## 2022-08-19 DIAGNOSIS — Z7982 Long term (current) use of aspirin: Secondary | ICD-10-CM

## 2022-08-19 DIAGNOSIS — J9601 Acute respiratory failure with hypoxia: Secondary | ICD-10-CM | POA: Diagnosis present

## 2022-08-19 DIAGNOSIS — Z7951 Long term (current) use of inhaled steroids: Secondary | ICD-10-CM

## 2022-08-19 DIAGNOSIS — I10 Essential (primary) hypertension: Secondary | ICD-10-CM | POA: Diagnosis not present

## 2022-08-19 DIAGNOSIS — Z87891 Personal history of nicotine dependence: Secondary | ICD-10-CM

## 2022-08-19 DIAGNOSIS — R0902 Hypoxemia: Secondary | ICD-10-CM | POA: Diagnosis not present

## 2022-08-19 DIAGNOSIS — Z9981 Dependence on supplemental oxygen: Secondary | ICD-10-CM

## 2022-08-19 DIAGNOSIS — F4310 Post-traumatic stress disorder, unspecified: Secondary | ICD-10-CM | POA: Diagnosis present

## 2022-08-19 DIAGNOSIS — Z79899 Other long term (current) drug therapy: Secondary | ICD-10-CM

## 2022-08-19 DIAGNOSIS — Z602 Problems related to living alone: Secondary | ICD-10-CM | POA: Diagnosis present

## 2022-08-19 DIAGNOSIS — Z885 Allergy status to narcotic agent status: Secondary | ICD-10-CM

## 2022-08-19 DIAGNOSIS — Z888 Allergy status to other drugs, medicaments and biological substances status: Secondary | ICD-10-CM

## 2022-08-19 LAB — BASIC METABOLIC PANEL
Anion gap: 14 (ref 5–15)
BUN: 25 mg/dL — ABNORMAL HIGH (ref 8–23)
CO2: 24 mmol/L (ref 22–32)
Calcium: 8.9 mg/dL (ref 8.9–10.3)
Chloride: 96 mmol/L — ABNORMAL LOW (ref 98–111)
Creatinine, Ser: 1.29 mg/dL — ABNORMAL HIGH (ref 0.61–1.24)
GFR, Estimated: 59 mL/min — ABNORMAL LOW (ref >60)
Glucose, Bld: 143 mg/dL — ABNORMAL HIGH (ref 70–99)
Potassium: 3.5 mmol/L (ref 3.5–5.1)
Sodium: 134 mmol/L — ABNORMAL LOW (ref 135–145)

## 2022-08-19 LAB — CBC WITH DIFFERENTIAL/PLATELET
Abs Immature Granulocytes: 0.12 10*3/uL — ABNORMAL HIGH (ref 0.00–0.07)
Basophils Absolute: 0.1 10*3/uL (ref 0.0–0.1)
Basophils Relative: 0 %
Eosinophils Absolute: 0 10*3/uL (ref 0.0–0.5)
Eosinophils Relative: 0 %
HCT: 42.8 % (ref 39.0–52.0)
Hemoglobin: 14.5 g/dL (ref 13.0–17.0)
Immature Granulocytes: 1 %
Lymphocytes Relative: 5 %
Lymphs Abs: 0.9 10*3/uL (ref 0.7–4.0)
MCH: 30.7 pg (ref 26.0–34.0)
MCHC: 33.9 g/dL (ref 30.0–36.0)
MCV: 90.7 fL (ref 80.0–100.0)
Monocytes Absolute: 2.2 10*3/uL — ABNORMAL HIGH (ref 0.1–1.0)
Monocytes Relative: 12 %
Neutro Abs: 15.6 10*3/uL — ABNORMAL HIGH (ref 1.7–7.7)
Neutrophils Relative %: 82 %
Platelets: 260 10*3/uL (ref 150–400)
RBC: 4.72 MIL/uL (ref 4.22–5.81)
RDW: 13.2 % (ref 11.5–15.5)
WBC: 18.9 10*3/uL — ABNORMAL HIGH (ref 4.0–10.5)
nRBC: 0 % (ref 0.0–0.2)

## 2022-08-19 LAB — GROUP A STREP BY PCR: Group A Strep by PCR: NOT DETECTED

## 2022-08-19 LAB — LACTIC ACID, PLASMA: Lactic Acid, Venous: 1.4 mmol/L (ref 0.5–1.9)

## 2022-08-19 LAB — RESP PANEL BY RT-PCR (RSV, FLU A&B, COVID)  RVPGX2
Influenza A by PCR: NEGATIVE
Influenza B by PCR: NEGATIVE
Resp Syncytial Virus by PCR: NEGATIVE
SARS Coronavirus 2 by RT PCR: NEGATIVE

## 2022-08-19 LAB — BRAIN NATRIURETIC PEPTIDE: B Natriuretic Peptide: 519 pg/mL — ABNORMAL HIGH (ref 0.0–100.0)

## 2022-08-19 MED ORDER — ALBUTEROL SULFATE (2.5 MG/3ML) 0.083% IN NEBU: 2.5000 mg | INHALATION_SOLUTION | Freq: Four times a day (QID) | RESPIRATORY_TRACT | Status: DC

## 2022-08-19 MED ORDER — IPRATROPIUM-ALBUTEROL 0.5-2.5 (3) MG/3ML IN SOLN
3.0000 mL | Freq: Once | RESPIRATORY_TRACT | Status: AC
  Administered 2022-08-19: 3 mL via RESPIRATORY_TRACT
  Filled 2022-08-19: qty 3

## 2022-08-19 MED ORDER — BUPROPION HCL ER (SR) 150 MG PO TB12
150.0000 mg | ORAL_TABLET | Freq: Two times a day (BID) | ORAL | Status: DC
  Administered 2022-08-19 – 2022-08-22 (×6): 150 mg via ORAL
  Filled 2022-08-19 (×6): qty 1

## 2022-08-19 MED ORDER — ISOSORBIDE MONONITRATE ER 60 MG PO TB24
30.0000 mg | ORAL_TABLET | Freq: Every day | ORAL | Status: DC
  Administered 2022-08-20 – 2022-08-22 (×3): 30 mg via ORAL
  Filled 2022-08-19 (×3): qty 1

## 2022-08-19 MED ORDER — METHYLPREDNISOLONE SODIUM SUCC 125 MG IJ SOLR
125.0000 mg | Freq: Once | INTRAMUSCULAR | Status: AC
  Administered 2022-08-19: 125 mg via INTRAVENOUS
  Filled 2022-08-19: qty 2

## 2022-08-19 MED ORDER — METHYLPREDNISOLONE SODIUM SUCC 125 MG IJ SOLR
60.0000 mg | Freq: Two times a day (BID) | INTRAMUSCULAR | Status: DC
  Administered 2022-08-20 – 2022-08-22 (×5): 60 mg via INTRAVENOUS
  Filled 2022-08-19 (×5): qty 2

## 2022-08-19 MED ORDER — ENOXAPARIN SODIUM 40 MG/0.4ML IJ SOSY
40.0000 mg | PREFILLED_SYRINGE | INTRAMUSCULAR | Status: DC
  Administered 2022-08-19 – 2022-08-21 (×3): 40 mg via SUBCUTANEOUS
  Filled 2022-08-19 (×3): qty 0.4

## 2022-08-19 MED ORDER — ACETAMINOPHEN 325 MG PO TABS
650.0000 mg | ORAL_TABLET | Freq: Four times a day (QID) | ORAL | Status: DC | PRN
  Filled 2022-08-19: qty 2

## 2022-08-19 MED ORDER — FAMOTIDINE 20 MG PO TABS
20.0000 mg | ORAL_TABLET | Freq: Every day | ORAL | Status: DC
  Administered 2022-08-19 – 2022-08-21 (×3): 20 mg via ORAL
  Filled 2022-08-19 (×3): qty 1

## 2022-08-19 MED ORDER — LOSARTAN POTASSIUM 50 MG PO TABS
50.0000 mg | ORAL_TABLET | Freq: Every day | ORAL | Status: DC
  Administered 2022-08-20 – 2022-08-22 (×3): 50 mg via ORAL
  Filled 2022-08-19 (×3): qty 1

## 2022-08-19 MED ORDER — ONDANSETRON HCL 4 MG PO TABS: 4.0000 mg | ORAL_TABLET | Freq: Four times a day (QID) | ORAL | Status: DC | PRN

## 2022-08-19 MED ORDER — ACETAMINOPHEN 650 MG RE SUPP: 650.0000 mg | Freq: Four times a day (QID) | RECTAL | Status: DC | PRN

## 2022-08-19 MED ORDER — ATORVASTATIN CALCIUM 20 MG PO TABS
20.0000 mg | ORAL_TABLET | Freq: Every day | ORAL | Status: DC
  Administered 2022-08-19 – 2022-08-21 (×3): 20 mg via ORAL
  Filled 2022-08-19 (×3): qty 1

## 2022-08-19 MED ORDER — GUAIFENESIN-DM 100-10 MG/5ML PO SYRP: 15.0000 mL | ORAL_SOLUTION | Freq: Four times a day (QID) | ORAL | Status: DC

## 2022-08-19 MED ORDER — ALBUTEROL SULFATE (2.5 MG/3ML) 0.083% IN NEBU
5.0000 mg | INHALATION_SOLUTION | Freq: Once | RESPIRATORY_TRACT | Status: AC
  Administered 2022-08-19: 5 mg via RESPIRATORY_TRACT
  Filled 2022-08-19: qty 6

## 2022-08-19 MED ORDER — HYDROCHLOROTHIAZIDE 12.5 MG PO TABS
12.5000 mg | ORAL_TABLET | Freq: Every day | ORAL | Status: DC
  Administered 2022-08-20 – 2022-08-21 (×2): 12.5 mg via ORAL
  Filled 2022-08-19 (×2): qty 1

## 2022-08-19 MED ORDER — ALBUTEROL SULFATE (2.5 MG/3ML) 0.083% IN NEBU
2.5000 mg | INHALATION_SOLUTION | Freq: Four times a day (QID) | RESPIRATORY_TRACT | Status: AC
  Administered 2022-08-19 – 2022-08-20 (×3): 2.5 mg via RESPIRATORY_TRACT
  Filled 2022-08-19 (×3): qty 3

## 2022-08-19 MED ORDER — ALBUTEROL SULFATE (2.5 MG/3ML) 0.083% IN NEBU: 5.0000 mg | INHALATION_SOLUTION | RESPIRATORY_TRACT | Status: DC | PRN

## 2022-08-19 MED ORDER — LOSARTAN POTASSIUM-HCTZ 100-25 MG PO TABS: 0.5000 | ORAL_TABLET | Freq: Every day | ORAL | Status: DC

## 2022-08-19 MED ORDER — LORATADINE 10 MG PO TABS
10.0000 mg | ORAL_TABLET | Freq: Every day | ORAL | Status: DC
  Administered 2022-08-19 – 2022-08-22 (×4): 10 mg via ORAL
  Filled 2022-08-19 (×4): qty 1

## 2022-08-19 MED ORDER — POLYETHYLENE GLYCOL 3350 17 G PO PACK: 17.0000 g | PACK | Freq: Every day | ORAL | Status: DC | PRN

## 2022-08-19 MED ORDER — MOMETASONE FURO-FORMOTEROL FUM 200-5 MCG/ACT IN AERO
2.0000 | INHALATION_SPRAY | Freq: Two times a day (BID) | RESPIRATORY_TRACT | Status: DC
  Administered 2022-08-19 – 2022-08-21 (×4): 2 via RESPIRATORY_TRACT
  Filled 2022-08-19: qty 8.8

## 2022-08-19 MED ORDER — ONDANSETRON HCL 4 MG/2ML IJ SOLN: 4.0000 mg | Freq: Four times a day (QID) | INTRAMUSCULAR | Status: DC | PRN

## 2022-08-19 MED ORDER — ALBUTEROL SULFATE HFA 108 (90 BASE) MCG/ACT IN AERS: 2.0000 | INHALATION_SPRAY | RESPIRATORY_TRACT | Status: DC | PRN

## 2022-08-19 MED ORDER — GUAIFENESIN-DM 100-10 MG/5ML PO SYRP
15.0000 mL | ORAL_SOLUTION | Freq: Three times a day (TID) | ORAL | Status: AC
  Administered 2022-08-19 – 2022-08-20 (×3): 15 mL via ORAL
  Filled 2022-08-19 (×3): qty 15

## 2022-08-19 MED ORDER — ASPIRIN 81 MG PO TBEC
81.0000 mg | DELAYED_RELEASE_TABLET | Freq: Every day | ORAL | Status: DC
  Administered 2022-08-20 – 2022-08-22 (×3): 81 mg via ORAL
  Filled 2022-08-19 (×3): qty 1

## 2022-08-19 MED ORDER — CARVEDILOL 12.5 MG PO TABS
12.5000 mg | ORAL_TABLET | Freq: Two times a day (BID) | ORAL | Status: DC
  Administered 2022-08-20 – 2022-08-22 (×5): 12.5 mg via ORAL
  Filled 2022-08-19 (×5): qty 1

## 2022-08-19 NOTE — Assessment & Plan Note (Addendum)
O2 sats down to 89% on room air, on 2 L.  Reports productive cough, fever of 101 at home.  Here he is afebrile.  But with leukocytosis of 18.9.  Chest x-ray clear.  Lung exam on my evaluation is unremarkable, but he is status post albuterol and DuoNebs in the ED, ED provider reports slight expiratory wheezing. -BNP elevated 519, no prior to compare, no evidence of volume overload, last Echo 02/2022-EF of 60 to 65%. -Mucolytic's -Peak Flow -DuoNebs as needed and scheduled -125 mg Solu-Medrol given in ED, continue 60 twice daily -At this time no focus of infection to explain leukocytosis, pending clinical course consider repeat chest imaging in a.m.

## 2022-08-19 NOTE — H&P (Signed)
History and Physical    Chad Hensley XLK:[id number] DOB: Mar 19, 1951 DOA: 08/19/2022  PCP: [facility]   Patient coming from: Home  I have personally briefly reviewed patient's old medical records in [facility]  Chief Complaint: Difficulty Breathing  HPI: Chad Hensley is a 73 y.o. male with medical history significant for hypertension, asthma, hypertensive urgency.  Patient presented to the ED with complaints of difficulty breathing that started over the weekend.  Reports cough productive of large amounts of yellowish-green sputum.  No chest pain or tightness. He reports fever of up to 101 at home.  No lower extremity swelling.  Reports wheezing.  Quit Smoking cigarettes in the 70s. Reports some pain to the right side of his neck that started after a bout of severe coughing.    ED Course: Sats 89% on room air at rest and down to 87 when talking.  Temperature 98.7.  Heart rate mostly 80s to 90s.  Respiratory rate 19-22.  Blood pressure systolic 130s to 366Y.   Lactic acid 1.4.  WBC 18.9.  COVID influenza RSV negative.   Chest x-ray negative for acute abnormality. Nebs, 125 mg Solu-Medrol given in ED.  Hospitalist to admit for respiratory failure asthma exacerbation  Review of Systems: As per HPI all other systems reviewed and negative.  Past Medical History:  Diagnosis Date   PTSD (post-traumatic stress disorder) 05/04/1976    History reviewed. No pertinent surgical history.   reports that he quit smoking about 52 years ago. His smoking use included cigarettes. He started smoking about 53 years ago. He has a 1.00 pack-year smoking history. He does not have any smokeless tobacco history on file. He reports that he does not drink alcohol and does not use drugs.  Allergies  Allergen Reactions   Antihistamines, Diphenhydramine-Type Palpitations   Codeine Nausea And Vomiting    Family History  Problem Relation Age of Onset   Hypertension Mother    Hypertension  Father    Alpha-1 antitrypsin deficiency Neg Hx    Tuberous sclerosis Neg Hx     Prior to Admission medications   Medication Sig Start Date End Date Taking? Authorizing Provider  acetaminophen (TYLENOL) 500 MG tablet Take 2 tablets (1,000 mg total) by mouth every 8 (eight) hours as needed for headache. 02/17/22  Yes Vassie Loll, MD  ALBUTEROL IN Inhale into the lungs as needed.   Yes [provider]  aspirin EC 81 MG tablet Take 1 tablet (81 mg total) by mouth daily. Swallow whole. 02/18/22  Yes Vassie Loll, MD  atorvastatin (LIPITOR) 20 MG tablet Take 20 mg by mouth daily.   Yes [provider]  buPROPion (WELLBUTRIN SR) 150 MG 12 hr tablet Take 150 mg by mouth 2 (two) times daily.   Yes [provider]  carvedilol (COREG) 12.5 MG tablet Take 1 tablet (12.5 mg total) by mouth 2 (two) times daily with a meal. 02/17/22  Yes Vassie Loll, MD  cetirizine (ZYRTEC) 10 MG tablet Take 10 mg by mouth daily.   Yes [provider]  Cholecalciferol 2000 UNITS TABS Take by mouth daily.   Yes [provider]  famotidine (PEPCID) 10 MG tablet Take 2 tablets (20 mg total) by mouth at bedtime. 02/17/22  Yes Vassie Loll, MD  fluticasone-salmeterol [facility] INHUB) 250-50 MCG/ACT AEPB Inhale 1 puff into the lungs in the morning and at bedtime.   Yes [provider]  isosorbide mononitrate (IMDUR) 30 MG 24 hr tablet Take 1 tablet (  30 mg total) by mouth daily. 02/18/22  Yes Vassie Loll, MD  losartan-hydrochlorothiazide (HYZAAR) 100-25 MG tablet Take 0.5 tablets by mouth daily. 02/17/22  Yes Vassie Loll, MD  Omega-3 Fatty Acids (FISH OIL PO) Take 1 capsule by mouth 2 (two) times daily.   Yes [provider]  omeprazole (PRILOSEC) 20 MG capsule Take 20 mg by mouth daily.   Yes [provider]  vitamin B-12 (CYANOCOBALAMIN) 50 MCG tablet Take 50 mcg by mouth daily.   Yes [provider]    Physical Exam: Vitals:    08/19/22 1221 08/19/22 1411 08/19/22 1500 08/19/22 1530  BP: (!) 143/81  (!) 140/80 (!) 144/85  Pulse: 94 87 92 89  Resp: (!) 22 19 20  (!) 29  Temp:      TempSrc:      SpO2: 98% 94% 99% 95%  Weight:      Height:        Constitutional: NAD, calm, comfortable Vitals:   08/19/22 1221 08/19/22 1411 08/19/22 1500 08/19/22 1530  BP: (!) 143/81  (!) 140/80 (!) 144/85  Pulse: 94 87 92 89  Resp: (!) 22 19 20  (!) 29  Temp:      TempSrc:      SpO2: 98% 94% 99% 95%  Weight:      Height:       Eyes: PERRL, lids and conjunctivae normal ENMT: Mucous membranes are moist.  Neck: normal, supple, no masses, no thyromegaly Respiratory: At the time of my evaluation clear to auscultation bilaterally, no wheezing, no crackles. Normal respiratory effort. No accessory muscle use.  Cardiovascular: Regular rate and rhythm, no murmurs / rubs / gallops. No extremity edema.  Extremities warm.  Abdomen: no tenderness, no masses palpated. No hepatosplenomegaly. Bowel sounds positive.  Musculoskeletal: no clubbing / cyanosis. No joint deformity upper and lower extremities.  Skin: no rashes, lesions, ulcers. No induration Neurologic: No apparent cranial nerve abnormalities, moving extremities spontaneously. Psychiatric: Normal judgment and insight. Alert and oriented x 3. Normal mood.   Labs on Admission: I have personally reviewed following labs and imaging studies  CBC: Recent Labs  Lab 08/19/22 1155  WBC 18.9*  NEUTROABS 15.6*  HGB 14.5  HCT 42.8  MCV 90.7  PLT 260   Basic Metabolic Panel: Recent Labs  Lab 08/19/22 1155  NA 134*  K 3.5  CL 96*  CO2 24  GLUCOSE 143*  BUN 25*  CREATININE 1.29*  CALCIUM 8.9   Radiological Exams on Admission: DG Neck Soft Tissue  Result Date: 08/19/2022 CLINICAL DATA:  Neck pain after cough EXAM: NECK SOFT TISSUES - 2 VIEW COMPARISON:  None Available. FINDINGS: There is no evidence of retropharyngeal soft tissue swelling or epiglottic enlargement. The  cervical airway is unremarkable and no radio-opaque foreign body identified. Degenerative changes in the cervical spine. IMPRESSION: No acute findings. Degenerative changes in the cervical spine. Electronically Signed   By: Wiliam Ke M.D.   On: 08/19/2022 12:26   DG Chest 2 View  Result Date: 08/19/2022 CLINICAL DATA:  Shortness of breath. EXAM: CHEST - 2 VIEW COMPARISON:  01/13/2022. FINDINGS: No consolidation or pulmonary edema. Stable cardiac and mediastinal contours. No pleural effusion or pneumothorax. Visualized bones and upper abdomen are unremarkable. IMPRESSION: No evidence of acute cardiopulmonary disease. Electronically Signed   By: Orvan Falconer M.D.   On: 08/19/2022 11:44    EKG: Independently reviewed.  Sinus tachycardia, rate 101, QTc 440.  No significant change from prior.  Assessment/Plan Principal Problem:  Acute hypoxic respiratory failure Active Problems:   Asthma exacerbation   Essential hypertension  Assessment and Plan: * Acute hypoxic respiratory failure O2 sats down to 89% on room air, on 2 L.  Reports productive cough, fever of 101 at home.  Here he is afebrile.  But with leukocytosis of 18.9.  Chest x-ray clear.  Lung exam on my evaluation is unremarkable, but he is status post albuterol and DuoNebs in the ED, ED provider reports slight expiratory wheezing. -BNP elevated 519, no prior to compare, no evidence of volume overload, last Echo 02/2022-EF of 60 to 65%. -Mucolytic's -Peak Flow -DuoNebs as needed and scheduled -125 mg Solu-Medrol given in ED, continue 60 twice daily -At this time no focus of infection to explain leukocytosis, pending clinical course consider repeat chest imaging in a.m.  Asthma exacerbation With hypoxic respiratory failure. - Plan per above  Essential hypertension Stable. -Resume carvedilol, Imdur, losartan HCTZ   DVT prophylaxis: Lovenox Code Status: FULL Family Communication: None at bedside.  Daughter Gloris Manchester is Midwife. Disposition Plan: ~ 1 day Consults called: None Admission status:  Obs tele    Author: Onnie Boer, MD 08/19/2022 3:40 PM  For on call review [email or web address].

## 2022-08-19 NOTE — Assessment & Plan Note (Signed)
Stable. -Resume carvedilol, Imdur, losartan HCTZ

## 2022-08-19 NOTE — ED Provider Notes (Signed)
[place] EMERGENCY DEPARTMENT AT [facility] Provider Note   CSN: [id number] Arrival date & time: 08/19/22  1106     History  Chief Complaint  Patient presents with   Shortness of Breath    Chad Hensley is a 72 y.o. male.   Shortness of Breath Associated symptoms: cough, neck pain (right anterior neck pain) and sore throat   Associated symptoms: no abdominal pain, no chest pain, no fever, no headaches and no vomiting        Chad Hensley is a 72 y.o. male past medical history of chronic asthmatic bronchitis, hypertension, PTSD who presents to the Emergency Department from home complaining of chest tightness and shortness of breath.  Symptoms have been present most of this week.  States cough has been productive of dark-colored sputum.  Shortness of breath became worse last evening.  Describes tightness across his chest.  Uses albuterol inhaler at home without improvement, does not use supplemental oxygen at baseline.  Also states that he woke during the early morning hours and had a forceful episode of coughing and felt something "pull or pop" in his right neck.  He now also endorses sore throat.  He denies fever, chest pain, chills, abdominal pain, nausea, vomiting.    [facility] Medications Prior to Admission medications   Medication Sig Start Date End Date Taking? Authorizing Provider  acetaminophen (TYLENOL) 500 MG tablet Take 2 tablets (1,000 mg total) by mouth every 8 (eight) hours as needed for headache. 02/17/22  Yes Vassie Loll, MD  ALBUTEROL IN Inhale into the lungs as needed.   Yes [provider]  aspirin EC 81 MG tablet Take 1 tablet (81 mg total) by mouth daily. Swallow whole. 02/18/22  Yes Vassie Loll, MD  atorvastatin (LIPITOR) 20 MG tablet Take 20 mg by mouth daily.   Yes [provider]  buPROPion (WELLBUTRIN SR) 150 MG 12 hr tablet Take 150 mg by mouth 2 (two) times daily.   Yes [provider]   carvedilol (COREG) 12.5 MG tablet Take 1 tablet (12.5 mg total) by mouth 2 (two) times daily with a meal. 02/17/22  Yes Vassie Loll, MD  cetirizine (ZYRTEC) 10 MG tablet Take 10 mg by mouth daily.   Yes [provider]  Cholecalciferol 2000 UNITS TABS Take by mouth daily.   Yes [provider]  famotidine (PEPCID) 10 MG tablet Take 2 tablets (20 mg total) by mouth at bedtime. 02/17/22  Yes Vassie Loll, MD  fluticasone-salmeterol [facility] INHUB) 250-50 MCG/ACT AEPB Inhale 1 puff into the lungs in the morning and at bedtime.   Yes [provider]  isosorbide mononitrate (IMDUR) 30 MG 24 hr tablet Take 1 tablet (30 mg total) by mouth daily. 02/18/22  Yes Vassie Loll, MD  losartan-hydrochlorothiazide (HYZAAR) 100-25 MG tablet Take 0.5 tablets by mouth daily. 02/17/22  Yes Vassie Loll, MD  Omega-3 Fatty Acids (FISH OIL PO) Take 1 capsule by mouth 2 (two) times daily.   Yes [provider]  omeprazole (PRILOSEC) 20 MG capsule Take 20 mg by mouth daily.   Yes [provider]  vitamin B-12 (CYANOCOBALAMIN) 50 MCG tablet Take 50 mcg by mouth daily.   Yes [provider]      Allergies    Antihistamines, diphenhydramine-type and Codeine    Review of Systems   Review of Systems  Constitutional:  Negative for chills and fever.  HENT:  Positive for sore throat. Negative for  congestion, trouble swallowing and voice change.   Respiratory:  Positive for cough, chest tightness and shortness of breath.   Cardiovascular:  Negative for chest pain, palpitations and leg swelling.  Gastrointestinal:  Negative for abdominal pain, nausea and vomiting.  Genitourinary:  Negative for difficulty urinating.  Musculoskeletal:  Positive for neck pain (right anterior neck pain). Negative for back pain.  Skin:  Negative for wound.  Neurological:  Negative for dizziness, weakness, numbness and headaches.    Physical Exam Updated Vital Signs BP (!) 144/85    Pulse 89   Temp 98.7 F (37.1 C) (Oral)   Resp (!) 29   Ht 5\' 8"  (1.727 m)   Wt 75.9 kg   SpO2 95%   BMI 25.45 kg/m  Physical Exam Vitals and nursing note reviewed.  Constitutional:      General: He is not in acute distress.    Appearance: Normal appearance. He is well-developed. He is not ill-appearing or toxic-appearing.  HENT:     Right Ear: Tympanic membrane and ear canal normal.     Left Ear: Tympanic membrane and ear canal normal.     Mouth/Throat:     Mouth: Mucous membranes are moist.     Pharynx: Posterior oropharyngeal erythema present.     Comments: Mild to moderate erythema of the oropharynx.  No edema, no bulging of the soft palate.  No tonsillar exudate.  Uvula midline nonedematous. Neck:     Trachea: Phonation normal.     Comments: Tenderness to palpation at the mid to lower right anterior neck.  No palpable masses or fullness.  Do not appreciate any lymphadenopathy Cardiovascular:     Rate and Rhythm: Normal rate and regular rhythm.     Pulses: Normal pulses.  Pulmonary:     Effort: Pulmonary effort is normal.     Breath sounds: No rales.     Comments: Lung sounds diminished bilaterally.  Slight expiratory wheeze noted on the right.  No rales.  Patient was hypoxic on arrival on room air.  Placed on O2 on arrival, no increased work of breathing on my exam while on 2 L O2 by nasal cannula. Abdominal:     Palpations: Abdomen is soft.     Tenderness: There is no abdominal tenderness.  Musculoskeletal:     Cervical back: Normal range of motion.     Right lower leg: No edema.     Left lower leg: No edema.  Lymphadenopathy:     Cervical: No cervical adenopathy.  Skin:    General: Skin is warm.     Capillary Refill: Capillary refill takes less than 2 seconds.     Findings: No rash.  Neurological:     General: No focal deficit present.     Mental Status: He is alert.     Sensory: No sensory deficit.     Motor: No weakness.     ED Results / Procedures /  Treatments   Labs (all labs ordered are listed, but only abnormal results are displayed) Labs Reviewed  BASIC METABOLIC PANEL - Abnormal; Notable for the following components:      Result Value   Sodium 134 (*)    Chloride 96 (*)    Glucose, Bld 143 (*)    BUN 25 (*)    Creatinine, Ser 1.29 (*)    GFR, Estimated 59 (*)    All other components within normal limits  CBC WITH DIFFERENTIAL/PLATELET - Abnormal; Notable for the following components:   WBC 18.9 (*)  Neutro Abs 15.6 (*)    Monocytes Absolute 2.2 (*)    Abs Immature Granulocytes 0.12 (*)    All other components within normal limits  GROUP A STREP BY PCR  RESP PANEL BY RT-PCR (RSV, FLU A&B, COVID)  [id number]  LACTIC ACID, PLASMA  BRAIN NATRIURETIC PEPTIDE    EKG None  Radiology DG Neck Soft Tissue  Result Date: 08/19/2022 CLINICAL DATA:  Neck pain after cough EXAM: NECK SOFT TISSUES - 2 VIEW COMPARISON:  None Available. FINDINGS: There is no evidence of retropharyngeal soft tissue swelling or epiglottic enlargement. The cervical airway is unremarkable and no radio-opaque foreign body identified. Degenerative changes in the cervical spine. IMPRESSION: No acute findings. Degenerative changes in the cervical spine. Electronically Signed   By: Wiliam Ke M.D.   On: 08/19/2022 12:26   DG Chest 2 View  Result Date: 08/19/2022 CLINICAL DATA:  Shortness of breath. EXAM: CHEST - 2 VIEW COMPARISON:  01/13/2022. FINDINGS: No consolidation or pulmonary edema. Stable cardiac and mediastinal contours. No pleural effusion or pneumothorax. Visualized bones and upper abdomen are unremarkable. IMPRESSION: No evidence of acute cardiopulmonary disease. Electronically Signed   By: Orvan Falconer M.D.   On: 08/19/2022 11:44    Procedures Procedures    Medications Ordered in ED Medications  ipratropium-albuterol (DUONEB) 0.5-2.5 (3) MG/3ML nebulizer solution 3 mL (3 mLs Nebulization Given 08/19/22 1221)  methylPREDNISolone sodium  succinate (SOLU-MEDROL) 125 mg/2 mL injection 125 mg (125 mg Intravenous Given 08/19/22 1222)  albuterol (PROVENTIL) (2.5 MG/3ML) 0.083% nebulizer solution 5 mg (5 mg Nebulization Given 08/19/22 1411)    ED Course/ Medical Decision Making/ A&P                             Medical Decision Making Patient here from home with cough, sore throat and shortness of breath.  Remote history of cigarette smoking but not currently.  Uses albuterol inhalers at home.  Does not require supplemental oxygen at baseline.  Reports cough has been productive of dark-colored sputum.  No reported fever or chills at home.  No known sick contacts.  Differential would include but not limited to COPD, asthmatic bronchitis exacerbation, ACS, PE, pneumonia, sepsis  Amount and/or Complexity of Data Reviewed Labs: ordered.    Details: Labs leukocytosis of 18,900, chemistries show slightly elevated BUN at 25, serum creatinine 2.9 mildly elevated from 6 months ago.  Strep PCR negative, respiratory panel negative.  Lactic acid unremarkable. Radiology: ordered.    Details: Chest x-ray without evidence of acute cardiopulmonary process.  Soft tissue neck was also ordered for further evaluation of patient's right-sided neck pain.  No evidence of retropharyngeal soft tissue swelling or epiglottic enlargement ECG/medicine tests: ordered.    Details: EKG shows sinus tachycardia otherwise normal EKG Discussion of management or test interpretation with external provider(s): Patient with hypoxia on arrival, reported productive cough.  Does not typically require supplemental oxygen at baseline.  Symptoms worsening for several days. Sepsis initially considered, but had reassuring lactic acid.  Leukocytosis of unclear source.  No reported history of recent steroid use.  He was given steroid here along with nebs with slight improvement, but patient O2 sat drops to 88% when oxygen is discontinued.  Suspect exacerbation of his asthma, feel that  he would benefit from hospital admission, patient agreeable to plan  Discussed findings with [facility] hospitalist, Dr. Wendall Stade who is agreeable to admit    Risk Prescription drug management. Decision regarding  hospitalization.           Final Clinical Impression(s) / ED Diagnoses Final diagnoses:  Moderate persistent asthma with exacerbation  Hypoxia    Rx / DC Orders ED Discharge Orders     None         Pauline Aus, PA-C 08/19/22 1606    Benjiman Core, MD 08/22/22 [phone]

## 2022-08-19 NOTE — Progress Notes (Signed)
Call to get report from ED nurse ,not available at this time.Marland Kitchen

## 2022-08-19 NOTE — ED Triage Notes (Signed)
Pt in from home c/o SOB onset this weekend, hx of Asthma, pt reports thick, dark colored mucus with a productive cough, pt c/o worsening SOB last night, pt denies n/v/d, pt denies CP, A&O x4

## 2022-08-19 NOTE — Assessment & Plan Note (Signed)
With hypoxic respiratory failure. - Plan per above

## 2022-08-20 DIAGNOSIS — Z7951 Long term (current) use of inhaled steroids: Secondary | ICD-10-CM | POA: Diagnosis not present

## 2022-08-20 DIAGNOSIS — I110 Hypertensive heart disease with heart failure: Secondary | ICD-10-CM | POA: Diagnosis present

## 2022-08-20 DIAGNOSIS — D72829 Elevated white blood cell count, unspecified: Secondary | ICD-10-CM | POA: Diagnosis present

## 2022-08-20 DIAGNOSIS — I160 Hypertensive urgency: Secondary | ICD-10-CM | POA: Diagnosis present

## 2022-08-20 DIAGNOSIS — I10 Essential (primary) hypertension: Secondary | ICD-10-CM | POA: Diagnosis not present

## 2022-08-20 DIAGNOSIS — Z79899 Other long term (current) drug therapy: Secondary | ICD-10-CM | POA: Diagnosis not present

## 2022-08-20 DIAGNOSIS — Z885 Allergy status to narcotic agent status: Secondary | ICD-10-CM | POA: Diagnosis not present

## 2022-08-20 DIAGNOSIS — Z888 Allergy status to other drugs, medicaments and biological substances status: Secondary | ICD-10-CM | POA: Diagnosis not present

## 2022-08-20 DIAGNOSIS — Z8249 Family history of ischemic heart disease and other diseases of the circulatory system: Secondary | ICD-10-CM | POA: Diagnosis not present

## 2022-08-20 DIAGNOSIS — Z7982 Long term (current) use of aspirin: Secondary | ICD-10-CM | POA: Diagnosis not present

## 2022-08-20 DIAGNOSIS — Z1152 Encounter for screening for COVID-19: Secondary | ICD-10-CM | POA: Diagnosis not present

## 2022-08-20 DIAGNOSIS — Z602 Problems related to living alone: Secondary | ICD-10-CM | POA: Diagnosis present

## 2022-08-20 DIAGNOSIS — R0902 Hypoxemia: Secondary | ICD-10-CM | POA: Diagnosis present

## 2022-08-20 DIAGNOSIS — J9601 Acute respiratory failure with hypoxia: Secondary | ICD-10-CM | POA: Diagnosis not present

## 2022-08-20 DIAGNOSIS — I5033 Acute on chronic diastolic (congestive) heart failure: Secondary | ICD-10-CM | POA: Diagnosis present

## 2022-08-20 DIAGNOSIS — Z9981 Dependence on supplemental oxygen: Secondary | ICD-10-CM | POA: Diagnosis not present

## 2022-08-20 DIAGNOSIS — J45901 Unspecified asthma with (acute) exacerbation: Secondary | ICD-10-CM | POA: Diagnosis not present

## 2022-08-20 DIAGNOSIS — J4541 Moderate persistent asthma with (acute) exacerbation: Secondary | ICD-10-CM | POA: Diagnosis present

## 2022-08-20 DIAGNOSIS — F4310 Post-traumatic stress disorder, unspecified: Secondary | ICD-10-CM | POA: Diagnosis present

## 2022-08-20 DIAGNOSIS — Z87891 Personal history of nicotine dependence: Secondary | ICD-10-CM | POA: Diagnosis not present

## 2022-08-20 NOTE — Progress Notes (Addendum)
Pt removed Oxygen. Checked O2 stats, currently at 93% on room air resting comfortable.

## 2022-08-20 NOTE — TOC Progression Note (Signed)
Transition of Care [facility]) - Progression Note    Patient Details  Name: Chad Hensley MRN: [id number] Date of Birth: June 25, 1950  Transition of Care [facility]) CM/SW Contact  Elliot Gault, LCSW Phone Number: 08/20/2022, 1:38 PM  Clinical Narrative:     [facility] notification completed. Reference number is [id number]  Expected Discharge Plan: Home/Self Care Barriers to Discharge: Continued Medical Work up  Expected Discharge Plan and Services                                               Social Determinants of Health (SDOH) Interventions SDOH Screenings   Food Insecurity: No Food Insecurity (08/19/2022)  Housing: Low Risk  (08/19/2022)  Transportation Needs: No Transportation Needs (08/19/2022)  Utilities: Not At Risk (08/19/2022)  Tobacco Use: Medium Risk (08/19/2022)    Readmission Risk Interventions     No data to display

## 2022-08-20 NOTE — Progress Notes (Signed)
PROGRESS NOTE    Chad Hensley  ZOX:[id number] DOB: 05/20/1950 DOA: 08/19/2022 PCP: [facility]   Brief Narrative:    Chad Hensley is a 72 y.o. male with medical history significant for hypertension, asthma, hypertensive urgency.  Patient presented to the ED with complaints of difficulty breathing that started over the weekend.  He was admitted with acute hypoxemic respiratory failure in the setting of asthma exacerbation and was started on breathing treatments and Solu-Medrol.  He continues to have some ongoing shortness of breath.  Plan for discharge in the next 24-48 hours.  Assessment & Plan:   Principal Problem:   Acute hypoxic respiratory failure Active Problems:   Asthma exacerbation   Essential hypertension  Assessment and Plan:  Acute hypoxic respiratory failure secondary to acute asthma exacerbation O2 sats down to 89% on room air, on 2 L.  Reports productive cough, fever of 101 at home.  Here he is afebrile.  But with leukocytosis of 18.9.  Chest x-ray clear.  Lung exam on my evaluation is unremarkable, but he is status post albuterol and DuoNebs in the ED, ED provider reports slight expiratory wheezing. -BNP elevated 519, no prior to compare, no evidence of volume overload, last Echo 02/2022-EF of 60 to 65%. -Mucolytic's -Peak Flow -DuoNebs as needed and scheduled -125 mg Solu-Medrol given in ED, continue 60 twice daily -At this time no focus of infection to explain leukocytosis, continue to follow CBC -Check procalcitonin  Essential hypertension Stable. -Resume carvedilol, Imdur, losartan HCTZ  DVT prophylaxis:Lovenox Code Status: Full Family Communication: None at bedside Disposition Plan: Continue ongoing asthma treatments for 24-48 hours until back to baseline Status is: Observation The patient will require care spanning > 2 midnights and should be moved to inpatient because: Need for IV medications   Consultants:  None  Procedures:   None  Antimicrobials:  None   Subjective: Patient seen and evaluated today with ongoing chest tightness and shortness of breath as well as cough.  He states that he is improving however, but not back at baseline.  Objective: Vitals:   08/19/22 1952 08/19/22 2038 08/20/22 0234 08/20/22 0529  BP: (!) 155/90   (!) 142/86  Pulse: 88   80  Resp: (!) 24   (!) 22  Temp: 98 F (36.7 C)   97.9 F (36.6 C)  TempSrc: Oral   Oral  SpO2: 96% 96% (!) 89% 94%  Weight:      Height:       No intake or output data in the 24 hours ending 08/20/22 0639 Filed Weights   08/19/22 1123  Weight: 75.9 kg    Examination:  General exam: Appears calm and comfortable  Respiratory system: Clear to auscultation. Respiratory effort normal.  2 L nasal cannula. Cardiovascular system: S1 & S2 heard, RRR.  Gastrointestinal system: Abdomen is soft Central nervous system: Alert and awake Extremities: No edema Skin: No significant lesions noted Psychiatry: Flat affect.    Data Reviewed: I have personally reviewed following labs and imaging studies  CBC: Recent Labs  Lab 08/19/22 1155  WBC 18.9*  NEUTROABS 15.6*  HGB 14.5  HCT 42.8  MCV 90.7  PLT 260   Basic Metabolic Panel: Recent Labs  Lab 08/19/22 1155  NA 134*  K 3.5  CL 96*  CO2 24  GLUCOSE 143*  BUN 25*  CREATININE 1.29*  CALCIUM 8.9   GFR: Estimated Creatinine Clearance: 50.1 mL/min (A) (by C-G formula based on SCr of 1.29 mg/dL (H)). Liver  Function Tests: No results for input(s): "AST", "ALT", "ALKPHOS", "BILITOT", "PROT", "ALBUMIN" in the last 168 hours. No results for input(s): "LIPASE", "AMYLASE" in the last 168 hours. No results for input(s): "AMMONIA" in the last 168 hours. Coagulation Profile: No results for input(s): "INR", "PROTIME" in the last 168 hours. Cardiac Enzymes: No results for input(s): "CKTOTAL", "CKMB", "CKMBINDEX", "TROPONINI" in the last 168 hours. BNP (last 3 results) No results for input(s):  "PROBNP" in the last 8760 hours. HbA1C: No results for input(s): "HGBA1C" in the last 72 hours. CBG: No results for input(s): "GLUCAP" in the last 168 hours. Lipid Profile: No results for input(s): "CHOL", "HDL", "LDLCALC", "TRIG", "CHOLHDL", "LDLDIRECT" in the last 72 hours. Thyroid Function Tests: No results for input(s): "TSH", "T4TOTAL", "FREET4", "T3FREE", "THYROIDAB" in the last 72 hours. Anemia Panel: No results for input(s): "VITAMINB12", "FOLATE", "FERRITIN", "TIBC", "IRON", "RETICCTPCT" in the last 72 hours. Sepsis Labs: Recent Labs  Lab 08/19/22 1310  LATICACIDVEN 1.4    Recent Results (from the past 240 hour(s))  Group A Strep by PCR     Status: None   Collection Time: 08/19/22 12:05 PM   Specimen: Throat; Sterile Swab  Result Value Ref Range Status   Group A Strep by PCR NOT DETECTED NOT DETECTED Final    Comment: Performed at [facility], [address], [place], [place] [address]  Resp panel by RT-PCR (RSV, Flu A&B, Covid) Anterior Nasal Swab     Status: None   Collection Time: 08/19/22 12:56 PM   Specimen: Anterior Nasal Swab  Result Value Ref Range Status   SARS Coronavirus 2 by RT PCR NEGATIVE NEGATIVE Final    Comment: (NOTE) SARS-CoV-2 target nucleic acids are NOT DETECTED.  The SARS-CoV-2 RNA is generally detectable in upper respiratory specimens during the acute phase of infection. The lowest concentration of SARS-CoV-2 viral copies this assay can detect is 138 copies/mL. A negative result does not preclude SARS-Cov-2 infection and should not be used as the sole basis for treatment or other patient management decisions. A negative result may occur with  improper specimen collection/handling, submission of specimen other than nasopharyngeal swab, presence of viral mutation(s) within the areas targeted by this assay, and inadequate number of viral copies(<138 copies/mL). A negative result must be combined with clinical observations, patient history, and  epidemiological information. The expected result is Negative.  Fact Sheet for Patients:  BloggerCourse.com  Fact Sheet for Healthcare Providers:  SeriousBroker.it  This test is no t yet approved or cleared by the [place] FDA and  has been authorized for detection and/or diagnosis of SARS-CoV-2 by FDA under an Emergency Use Authorization (EUA). This EUA will remain  in effect (meaning this test can be used) for the duration of the COVID-19 declaration under Section 564(b)(1) of the Act, 21 U.S.C.section 360bbb-3(b)(1), unless the authorization is terminated  or revoked sooner.       Influenza A by PCR NEGATIVE NEGATIVE Final   Influenza B by PCR NEGATIVE NEGATIVE Final    Comment: (NOTE) The Xpert Xpress SARS-CoV-2/FLU/RSV plus assay is intended as an aid in the diagnosis of influenza from Nasopharyngeal swab specimens and should not be used as a sole basis for treatment. Nasal washings and aspirates are unacceptable for Xpert Xpress SARS-CoV-2/FLU/RSV testing.  Fact Sheet for Patients: BloggerCourse.com  Fact Sheet for Healthcare Providers: SeriousBroker.it  This test is not yet approved or cleared by the [place] FDA and has been authorized for detection and/or diagnosis of SARS-CoV-2 by FDA under an  Emergency Use Authorization (EUA). This EUA will remain in effect (meaning this test can be used) for the duration of the COVID-19 declaration under Section 564(b)(1) of the Act, 21 U.S.C. section 360bbb-3(b)(1), unless the authorization is terminated or revoked.     Resp Syncytial Virus by PCR NEGATIVE NEGATIVE Final    Comment: (NOTE) Fact Sheet for Patients: BloggerCourse.com  Fact Sheet for Healthcare Providers: SeriousBroker.it  This test is not yet approved or cleared by the [place] FDA and has been  authorized for detection and/or diagnosis of SARS-CoV-2 by FDA under an Emergency Use Authorization (EUA). This EUA will remain in effect (meaning this test can be used) for the duration of the COVID-19 declaration under Section 564(b)(1) of the Act, 21 U.S.C. section 360bbb-3(b)(1), unless the authorization is terminated or revoked.  Performed at [facility], [address], [place], [place] [address]          Radiology Studies: DG Neck Soft Tissue  Result Date: 08/19/2022 CLINICAL DATA:  Neck pain after cough EXAM: NECK SOFT TISSUES - 2 VIEW COMPARISON:  None Available. FINDINGS: There is no evidence of retropharyngeal soft tissue swelling or epiglottic enlargement. The cervical airway is unremarkable and no radio-opaque foreign body identified. Degenerative changes in the cervical spine. IMPRESSION: No acute findings. Degenerative changes in the cervical spine. Electronically Signed   By: Wiliam Ke M.D.   On: 08/19/2022 12:26   DG Chest 2 View  Result Date: 08/19/2022 CLINICAL DATA:  Shortness of breath. EXAM: CHEST - 2 VIEW COMPARISON:  01/13/2022. FINDINGS: No consolidation or pulmonary edema. Stable cardiac and mediastinal contours. No pleural effusion or pneumothorax. Visualized bones and upper abdomen are unremarkable. IMPRESSION: No evidence of acute cardiopulmonary disease. Electronically Signed   By: Orvan Falconer M.D.   On: 08/19/2022 11:44        Scheduled Meds:  albuterol  2.5 mg Nebulization Q6H   aspirin EC  81 mg Oral Daily   atorvastatin  20 mg Oral QHS   buPROPion  150 mg Oral BID   carvedilol  12.5 mg Oral BID WC   enoxaparin (LOVENOX) injection  40 mg Subcutaneous Q24H   famotidine  20 mg Oral QHS   guaiFENesin-dextromethorphan  15 mL Oral Q8H   losartan  50 mg Oral Daily   And   hydrochlorothiazide  12.5 mg Oral Daily   isosorbide mononitrate  30 mg Oral Daily   loratadine  10 mg Oral Daily   methylPREDNISolone (SOLU-MEDROL) injection  60 mg  Intravenous Q12H   mometasone-formoterol  2 puff Inhalation BID     LOS: 0 days    Time spent: 35 minutes    Miriana Gaertner Hoover Brunette, DO [facility]  If 7PM-7AM, please contact night-coverage [email or web address] 08/20/2022, 6:39 AM

## 2022-08-20 NOTE — TOC Progression Note (Signed)
  Transition of Care [facility]) Screening Note   Patient Details  Name: Chad Hensley Date of Birth: 08-Jul-1950   Transition [facility] Care [facility]) CM/SW Contact:    Leitha Bleak, RN Phone Number: 08/20/2022, 1:20 PM  TOC doing [facility] notification.   Transition of Care Department [facility]) has reviewed patient and no TOC needs have been identified at this time. We will continue to monitor patient advancement through interdisciplinary progression rounds. If new patient transition needs arise, please place a TOC consult.    Expected Discharge Plan: Home/Self Care Barriers to Discharge: Continued Medical Work up  Expected Discharge Plan and Services        Social Determinants of Health (SDOH) Interventions SDOH Screenings   Food Insecurity: No Food Insecurity (08/19/2022)  Housing: Low Risk  (08/19/2022)  Transportation Needs: No Transportation Needs (08/19/2022)  Utilities: Not At Risk (08/19/2022)  Tobacco Use: Medium Risk (08/19/2022)    Readmission Risk Interventions     No data to display

## 2022-08-20 NOTE — Progress Notes (Signed)
Pt removed oxygen, SP02 89. Oxygen applied and SP02 94 on 2L nasal cannula. Incentive spirometer given to pt and education provided. Respirations increased. Pt has had a productive cough during the night.

## 2022-08-21 DIAGNOSIS — J9601 Acute respiratory failure with hypoxia: Secondary | ICD-10-CM | POA: Diagnosis not present

## 2022-08-21 DIAGNOSIS — J45901 Unspecified asthma with (acute) exacerbation: Secondary | ICD-10-CM | POA: Diagnosis not present

## 2022-08-21 DIAGNOSIS — I10 Essential (primary) hypertension: Secondary | ICD-10-CM | POA: Diagnosis not present

## 2022-08-21 LAB — CBC
HCT: 42.1 % (ref 39.0–52.0)
Hemoglobin: 14.4 g/dL (ref 13.0–17.0)
MCH: 30.5 pg (ref 26.0–34.0)
MCHC: 34.2 g/dL (ref 30.0–36.0)
MCV: 89.2 fL (ref 80.0–100.0)
Platelets: 320 10*3/uL (ref 150–400)
RBC: 4.72 MIL/uL (ref 4.22–5.81)
RDW: 12.9 % (ref 11.5–15.5)
WBC: 19.4 10*3/uL — ABNORMAL HIGH (ref 4.0–10.5)
nRBC: 0 % (ref 0.0–0.2)

## 2022-08-21 LAB — BASIC METABOLIC PANEL
Anion gap: 12 (ref 5–15)
BUN: 34 mg/dL — ABNORMAL HIGH (ref 8–23)
CO2: 23 mmol/L (ref 22–32)
Calcium: 8.6 mg/dL — ABNORMAL LOW (ref 8.9–10.3)
Chloride: 98 mmol/L (ref 98–111)
Creatinine, Ser: 1.1 mg/dL (ref 0.61–1.24)
GFR, Estimated: >60 mL/min (ref >60)
Glucose, Bld: 193 mg/dL — ABNORMAL HIGH (ref 70–99)
Potassium: 3.7 mmol/L (ref 3.5–5.1)
Sodium: 133 mmol/L — ABNORMAL LOW (ref 135–145)

## 2022-08-21 LAB — MAGNESIUM: Magnesium: 2.4 mg/dL (ref 1.7–2.4)

## 2022-08-21 MED ORDER — DOXYCYCLINE HYCLATE 100 MG PO TABS
100.0000 mg | ORAL_TABLET | Freq: Two times a day (BID) | ORAL | Status: DC
  Administered 2022-08-21 – 2022-08-22 (×3): 100 mg via ORAL
  Filled 2022-08-21 (×3): qty 1

## 2022-08-21 MED ORDER — GUAIFENESIN ER 600 MG PO TB12
600.0000 mg | ORAL_TABLET | Freq: Two times a day (BID) | ORAL | Status: DC
  Administered 2022-08-21 – 2022-08-22 (×3): 600 mg via ORAL
  Filled 2022-08-21 (×3): qty 1

## 2022-08-21 MED ORDER — IPRATROPIUM-ALBUTEROL 0.5-2.5 (3) MG/3ML IN SOLN
3.0000 mL | Freq: Three times a day (TID) | RESPIRATORY_TRACT | Status: DC
  Administered 2022-08-22: 3 mL via RESPIRATORY_TRACT
  Filled 2022-08-21: qty 3

## 2022-08-21 MED ORDER — IPRATROPIUM-ALBUTEROL 0.5-2.5 (3) MG/3ML IN SOLN
3.0000 mL | Freq: Four times a day (QID) | RESPIRATORY_TRACT | Status: DC
  Administered 2022-08-21 (×2): 3 mL via RESPIRATORY_TRACT
  Filled 2022-08-21 (×2): qty 3

## 2022-08-21 MED ORDER — FUROSEMIDE 10 MG/ML IJ SOLN
20.0000 mg | Freq: Once | INTRAMUSCULAR | Status: AC
  Administered 2022-08-21: 20 mg via INTRAVENOUS
  Filled 2022-08-21: qty 2

## 2022-08-21 MED ORDER — ALBUTEROL SULFATE (2.5 MG/3ML) 0.083% IN NEBU: 2.5000 mg | INHALATION_SOLUTION | RESPIRATORY_TRACT | Status: DC | PRN

## 2022-08-21 NOTE — Progress Notes (Addendum)
SATURATION QUALIFICATIONS: (This note is used to comply with regulatory documentation for home oxygen)  Patient Saturations on Room Air at Rest = 93%  Patient Saturations on Room Air while Ambulating = 88%  Patient Saturations on 2 Liters of oxygen while Ambulating = 95%  Please briefly explain why patient needs home oxygen: Patient would benefit from home o2

## 2022-08-21 NOTE — Progress Notes (Signed)
Pt removed oxygen this a.m. SP02 88. Oxygen reapplied, SP02 94 on 1.5 L. Pt hypertensive, 163/87. MD notified.

## 2022-08-21 NOTE — TOC Initial Note (Signed)
Transition of Care [facility]) - Initial/Assessment Note    Patient Details  Name: Chad Hensley MRN: [id number] Date of Birth: 12/21/1950  Transition of Care [facility]) CM/SW Contact:    Leitha Bleak, RN Phone Number: 08/21/2022, 11:36 AM  Clinical Narrative:      Patient admitted with acute hypoxia respiratory failure. He lives home alone. His daughter assist as needed. He still drives himself. Uses [place]. He is on oxygen here, qualifying today, but not medically ready.  He states he is coughing up a lot of congestion today. Team will reassess tomorrow. TOC contacted [facility], for [facility] oxygen. They can deliver on the weekends with orders if needed. TOC following.              Expected Discharge Plan: Home/Self Care Barriers to Discharge: Continued Medical Work up   Patient Goals and CMS Choice Patient states their goals for this hospitalization and ongoing recovery are:: to go home. CMS Medicare.gov Compare Post Acute Care list provided to:: Patient Choice offered to / list presented to : Patient     Expected Discharge Plan and Services      Living arrangements for the past 2 months: Single Family Home                     Prior Living Arrangements/Services Living arrangements for the past 2 months: Single Family Home Lives with:: Self         Activities of Daily Living Home Assistive Devices/Equipment: Eyeglasses, Medical laboratory scientific officer (specify quad or straight) ADL Screening (condition at time of admission) Patient's cognitive ability adequate to safely complete daily activities?: Yes Is the patient deaf or have difficulty hearing?: No Does the patient have difficulty seeing, even when wearing glasses/contacts?: No Does the patient have difficulty concentrating, remembering, or making decisions?: No Patient able to express need for assistance with ADLs?: Yes Does the patient have difficulty dressing or bathing?: No Independently performs ADLs?: Yes (appropriate for developmental  age) Does the patient have difficulty walking or climbing stairs?: Yes Weakness of Legs: Both Weakness of Arms/Hands: Right  Permission Sought/Granted       Emotional Assessment      Orientation: : Oriented to Self, Oriented to Place, Oriented to  Time, Oriented to Situation Alcohol / Substance Use: Not Applicable Psych Involvement: No (comment)  Admission diagnosis:  Hypoxia [R09.02] Moderate persistent asthma with exacerbation [J45.41] Acute hypoxic respiratory failure [J96.01] Patient Active Problem List   Diagnosis Date Noted   Acute hypoxic respiratory failure 08/19/2022   Asthma exacerbation 08/19/2022   Hypertensive urgency    Gastroesophageal reflux disease    Migraine    Slurred speech 02/15/2022   PTSD (post-traumatic stress disorder) 02/15/2022   Psoriasis 02/15/2022   Asthmatic bronchitis , chronic 01/13/2022   Essential hypertension 01/13/2022   PCP:  [facility] Pharmacy:   [facility] - [place], [place] - [address] [place] [address] Phone: [phone] Fax: [phone]   Social Determinants of Health (SDOH) Social History: SDOH Screenings   Food Insecurity: No Food Insecurity (08/19/2022)  Housing: Low Risk  (08/19/2022)  Transportation Needs: No Transportation Needs (08/19/2022)  Utilities: Not At Risk (08/19/2022)  Tobacco Use: Medium Risk (08/19/2022)   SDOH Interventions:

## 2022-08-21 NOTE — Progress Notes (Signed)
[facility]  PROGRESS NOTE  Chad Hensley ZOX:[id number] DOB: March 12, 1951 DOA: 08/19/2022 PCP: [facility]   Brief HPI:    72 y.o. male with medical history significant for hypertension, asthma, hypertensive urgency.  Patient presented to the ED with complaints of difficulty breathing that started over the weekend.  He was admitted with acute hypoxemic respiratory failure in the setting of asthma exacerbation and was started on breathing treatments and Solu-Medrol.  BNP was elevated 512.   Subjective   Patient seen and examined, still coughing up phlegm, and has dyspnea on exertion.  O2 sats dropped to 88% on room air on ambulation.   Assessment/Plan:   Acute hypoxemic respiratory failure -Likely due to underlying asthma exacerbation as well as diastolic heart failure -Presented with fever of 101 at home, currently requiring 1.5 L of oxygen -On ambulation O2 sats dropped to 88%, patient is not on oxygen at home -Start doxycycline 100 mg p.o. twice daily, continue Solu-Medrol -Start scheduled DuoNebs, Mucinex 600 mg p.o. twice daily -BNP elevated at 512.  Echo from 10/23 showed EF 60 to 65% -He takes HCTZ 12.5 mg daily at home, will hold HCTZ and give 1 dose of Lasix 20 mg IV -Hopefully he can wean off oxygen to room air and discharged home in next 24 hours  Hypertension -Continue Coreg, Imdur, losartan -Hold HCTZ as patient started on Lasix as above     Medications     aspirin EC  81 mg Oral Daily   atorvastatin  20 mg Oral QHS   buPROPion  150 mg Oral BID   carvedilol  12.5 mg Oral BID WC   doxycycline  100 mg Oral Q12H   enoxaparin (LOVENOX) injection  40 mg Subcutaneous Q24H   famotidine  20 mg Oral QHS   guaiFENesin  600 mg Oral BID   ipratropium-albuterol  3 mL Nebulization Q6H   isosorbide mononitrate  30 mg Oral Daily   loratadine  10 mg Oral Daily   losartan  50 mg Oral Daily   methylPREDNISolone (SOLU-MEDROL) injection  60 mg Intravenous Q12H      Data Reviewed:   CBG:  No results for input(s): "GLUCAP" in the last 168 hours.  SpO2: 91 % O2 Flow Rate (L/min): 1 L/min    Vitals:   08/21/22 0533 08/21/22 0804 08/21/22 1304 08/21/22 1330  BP: (!) 163/87  132/85   Pulse: 76  77   Resp: 16  18   Temp: (!) 97.4 F (36.3 C)  98.6 F (37 C)   TempSrc: Oral  Oral   SpO2: 95% 94% 90% 91%  Weight:      Height:          Data Reviewed:  Basic Metabolic Panel: Recent Labs  Lab 08/19/22 1155 08/21/22 0518  NA 134* 133*  K 3.5 3.7  CL 96* 98  CO2 24 23  GLUCOSE 143* 193*  BUN 25* 34*  CREATININE 1.29* 1.10  CALCIUM 8.9 8.6*  MG  --  2.4    CBC: Recent Labs  Lab 08/19/22 1155 08/21/22 0518  WBC 18.9* 19.4*  NEUTROABS 15.6*  --   HGB 14.5 14.4  HCT 42.8 42.1  MCV 90.7 89.2  PLT 260 320    LFT No results for input(s): "AST", "ALT", "ALKPHOS", "BILITOT", "PROT", "ALBUMIN" in the last 168 hours.   Antibiotics: Anti-infectives (From admission, onward)    Start     Dose/Rate Route Frequency Ordered Stop   08/21/22 1230  doxycycline (VIBRA-TABS)  tablet 100 mg        100 mg Oral Every 12 hours 08/21/22 1132          DVT prophylaxis: Lovenox  Code Status: Full code  Family Communication: No family at bedside   CONSULTS    Objective    Physical Examination:   General-appears in no acute distress Heart-S1-S2, regular, no murmur auscultated Lungs-decreased breath sounds at lung bases Abdomen-soft, nontender, no organomegaly Extremities-no edema in the lower extremities Neuro-alert, oriented x3, no focal deficit noted   Status is: Inpatient:             Meredeth Ide   [facility] If 7PM-7AM, please contact night-coverage at [email or web address], Office  [phone]   08/21/2022, 4:57 PM  LOS: 1 day

## 2022-08-22 DIAGNOSIS — J9601 Acute respiratory failure with hypoxia: Secondary | ICD-10-CM | POA: Diagnosis not present

## 2022-08-22 DIAGNOSIS — I10 Essential (primary) hypertension: Secondary | ICD-10-CM | POA: Diagnosis not present

## 2022-08-22 DIAGNOSIS — J45901 Unspecified asthma with (acute) exacerbation: Secondary | ICD-10-CM | POA: Diagnosis not present

## 2022-08-22 LAB — BASIC METABOLIC PANEL
Anion gap: 10 (ref 5–15)
BUN: 32 mg/dL — ABNORMAL HIGH (ref 8–23)
CO2: 28 mmol/L (ref 22–32)
Calcium: 8.9 mg/dL (ref 8.9–10.3)
Chloride: 95 mmol/L — ABNORMAL LOW (ref 98–111)
Creatinine, Ser: 1.07 mg/dL (ref 0.61–1.24)
GFR, Estimated: >60 mL/min (ref >60)
Glucose, Bld: 223 mg/dL — ABNORMAL HIGH (ref 70–99)
Potassium: 4.2 mmol/L (ref 3.5–5.1)
Sodium: 133 mmol/L — ABNORMAL LOW (ref 135–145)

## 2022-08-22 LAB — CBC
HCT: 43.2 % (ref 39.0–52.0)
Hemoglobin: 14.7 g/dL (ref 13.0–17.0)
MCH: 30.5 pg (ref 26.0–34.0)
MCHC: 34 g/dL (ref 30.0–36.0)
MCV: 89.6 fL (ref 80.0–100.0)
Platelets: 326 10*3/uL (ref 150–400)
RBC: 4.82 MIL/uL (ref 4.22–5.81)
RDW: 12.8 % (ref 11.5–15.5)
WBC: 17.7 10*3/uL — ABNORMAL HIGH (ref 4.0–10.5)
nRBC: 0 % (ref 0.0–0.2)

## 2022-08-22 MED ORDER — LOSARTAN POTASSIUM 50 MG PO TABS: 50.0000 mg | ORAL_TABLET | Freq: Every day | ORAL | 2 refills | Status: DC

## 2022-08-22 MED ORDER — DOXYCYCLINE HYCLATE 100 MG PO TABS: 100.0000 mg | ORAL_TABLET | Freq: Two times a day (BID) | ORAL | 0 refills | Status: DC

## 2022-08-22 MED ORDER — GUAIFENESIN ER 600 MG PO TB12: 600.0000 mg | ORAL_TABLET | Freq: Two times a day (BID) | ORAL | 0 refills | Status: DC

## 2022-08-22 MED ORDER — FUROSEMIDE 20 MG PO TABS: 20.0000 mg | ORAL_TABLET | Freq: Every day | ORAL | 3 refills | Status: DC

## 2022-08-22 MED ORDER — PREDNISONE 10 MG PO TABS: ORAL_TABLET | ORAL | 0 refills | Status: DC

## 2022-08-22 NOTE — Progress Notes (Signed)
Pt discharged to home. DC instructions given. No concerns voiced. Pt left unit in wheelchair pushed by Cutler, NT. Left in stable condition.

## 2022-08-22 NOTE — Discharge Summary (Signed)
Physician Discharge Summary   Patient: Chad Hensley MRN: [id number] DOB: 1951/03/03  Admit date:     08/19/2022  Discharge date: 08/22/22  Discharge Physician: Meredeth Ide   PCP: [facility]   Recommendations at discharge:   Follow-up PCP in 1 week  Discharge Diagnoses: Principal Problem:   Acute hypoxic respiratory failure Active Problems:   Asthma exacerbation   Essential hypertension  Resolved Problems:   * No resolved hospital problems. *  Hospital Course: 72 y.o. male with medical history significant for hypertension, asthma, hypertensive urgency.  Patient presented to the ED with complaints of difficulty breathing that started over the weekend.  He was admitted with acute hypoxemic respiratory failure in the setting of asthma exacerbation and was started on breathing treatments and Solu-Medrol.  BNP was elevated 512.   Assessment and Plan:  Acute hypoxemic respiratory failure -Likely due to underlying asthma exacerbation as well as diastolic heart failure -Presented with fever of 101 at home, requiring 1.5 L of oxygen -Started doxycycline 100 mg p.o. twice daily, continue Solu-Medrol -Start scheduled DuoNebs, Mucinex 600 mg p.o. twice daily -BNP elevated at 512.  Echo from 10/23 showed EF 60 to 65% -He takes HCTZ 12.5 mg daily at home, HCTZ was held and patient was given 1 dose of Lasix 20 mg IV  -Oxygen has been weaned off.  O2 sats 90% on room air on ambulation. -Will discharge on prednisone taper, doxycycline, Mucinex   Hypertension -Continue Coreg, Imdur, losartan -Hold HCTZ as patient started on Lasix 20 mg daily  Acute on chronic diastolic heart failure -BNP was elevated 512 -Started on Lasix 20 mg p.o. daily -Will discontinue HCTZ         Consultants:  Procedures performed:  Disposition: Home Diet recommendation:  Discharge Diet Orders (From admission, onward)     Start     Ordered   08/22/22 0000  Diet - low sodium heart healthy         08/22/22 1140           Regular diet DISCHARGE MEDICATION: Allergies as of 08/22/2022       Reactions   Antihistamines, Diphenhydramine-type Palpitations   Codeine Nausea And Vomiting        Medication List     STOP taking these medications    losartan-hydrochlorothiazide 100-25 MG tablet Commonly known as: Hyzaar       TAKE these medications    acetaminophen 500 MG tablet Commonly known as: TYLENOL Take 2 tablets (1,000 mg total) by mouth every 8 (eight) hours as needed for headache.   ALBUTEROL IN Inhale into the lungs as needed.   aspirin EC 81 MG tablet Take 1 tablet (81 mg total) by mouth daily. Swallow whole.   atorvastatin 20 MG tablet Commonly known as: LIPITOR Take 20 mg by mouth daily.   buPROPion 150 MG 12 hr tablet Commonly known as: WELLBUTRIN SR Take 150 mg by mouth 2 (two) times daily.   carvedilol 12.5 MG tablet Commonly known as: COREG Take 1 tablet (12.5 mg total) by mouth 2 (two) times daily with a meal.   cetirizine 10 MG tablet Commonly known as: ZYRTEC Take 10 mg by mouth daily.   Cholecalciferol 50 MCG (2000 UT) Tabs Take by mouth daily.   doxycycline 100 MG tablet Commonly known as: VIBRA-TABS Take 1 tablet (100 mg total) by mouth every 12 (twelve) hours.   famotidine 10 MG tablet Commonly known as: PEPCID Take 2 tablets (20 mg total) by  mouth at bedtime.   FISH OIL PO Take 1 capsule by mouth 2 (two) times daily.   furosemide 20 MG tablet Commonly known as: Lasix Take 1 tablet (20 mg total) by mouth daily.   guaiFENesin 600 MG 12 hr tablet Commonly known as: MUCINEX Take 1 tablet (600 mg total) by mouth 2 (two) times daily.   isosorbide mononitrate 30 MG 24 hr tablet Commonly known as: IMDUR Take 1 tablet (30 mg total) by mouth daily.   losartan 50 MG tablet Commonly known as: COZAAR Take 1 tablet (50 mg total) by mouth daily. Start taking on: August 23, 2022   omeprazole 20 MG capsule Commonly known  as: PRILOSEC Take 20 mg by mouth daily.   predniSONE 10 MG tablet Commonly known as: DELTASONE Prednisone 40 mg po daily x 1 day then Prednisone 30 mg po daily x 1 day then Prednisone 20 mg po daily x 1 day then Prednisone 10 mg daily x 1 day then stop...   vitamin B-12 50 MCG tablet Commonly known as: CYANOCOBALAMIN Take 50 mcg by mouth daily.   Wixela Inhub 250-50 MCG/ACT Aepb Generic drug: fluticasone-salmeterol Inhale 1 puff into the lungs in the morning and at bedtime.        Discharge Exam: Filed Weights   08/19/22 1123  Weight: 75.9 kg   General-appears in no acute distress Heart-S1-S2, regular, no murmur auscultated Lungs-clear to auscultation bilaterally, no wheezing or crackles auscultated Abdomen-soft, nontender, no organomegaly Extremities-no edema in the lower extremities Neuro-alert, oriented x3, no focal deficit noted  Condition at discharge: good  The results of significant diagnostics from this hospitalization (including imaging, microbiology, ancillary and laboratory) are listed below for reference.   Imaging Studies: DG Neck Soft Tissue  Result Date: 08/19/2022 CLINICAL DATA:  Neck pain after cough EXAM: NECK SOFT TISSUES - 2 VIEW COMPARISON:  None Available. FINDINGS: There is no evidence of retropharyngeal soft tissue swelling or epiglottic enlargement. The cervical airway is unremarkable and no radio-opaque foreign body identified. Degenerative changes in the cervical spine. IMPRESSION: No acute findings. Degenerative changes in the cervical spine. Electronically Signed   By: Wiliam Ke M.D.   On: 08/19/2022 12:26   DG Chest 2 View  Result Date: 08/19/2022 CLINICAL DATA:  Shortness of breath. EXAM: CHEST - 2 VIEW COMPARISON:  01/13/2022. FINDINGS: No consolidation or pulmonary edema. Stable cardiac and mediastinal contours. No pleural effusion or pneumothorax. Visualized bones and upper abdomen are unremarkable. IMPRESSION: No evidence of acute  cardiopulmonary disease. Electronically Signed   By: Orvan Falconer M.D.   On: 08/19/2022 11:44    Microbiology: Results for orders placed or performed during the hospital encounter of 08/19/22  Group A Strep by PCR     Status: None   Collection Time: 08/19/22 12:05 PM   Specimen: Throat; Sterile Swab  Result Value Ref Range Status   Group A Strep by PCR NOT DETECTED NOT DETECTED Final    Comment: Performed at [facility], [address] [place], [place] [address]  Resp panel by RT-PCR (RSV, Flu A&B, Covid) Anterior Nasal Swab     Status: None   Collection Time: 08/19/22 12:56 PM   Specimen: Anterior Nasal Swab  Result Value Ref Range Status   SARS Coronavirus 2 by RT PCR NEGATIVE NEGATIVE Final    Comment: (NOTE) SARS-CoV-2 target nucleic acids are NOT DETECTED.  The SARS-CoV-2 RNA is generally detectable in upper respiratory specimens during the acute phase of infection. The lowest concentration of SARS-CoV-2  viral copies this assay can detect is 138 copies/mL. A negative result does not preclude SARS-Cov-2 infection and should not be used as the sole basis for treatment or other patient management decisions. A negative result may occur with  improper specimen collection/handling, submission of specimen other than nasopharyngeal swab, presence of viral mutation(s) within the areas targeted by this assay, and inadequate number of viral copies(<138 copies/mL). A negative result must be combined with clinical observations, patient history, and epidemiological information. The expected result is Negative.  Fact Sheet for Patients:  BloggerCourse.com  Fact Sheet for Healthcare Providers:  SeriousBroker.it  This test is no t yet approved or cleared by the [place] FDA and  has been authorized for detection and/or diagnosis of SARS-CoV-2 by FDA under an Emergency Use Authorization (EUA). This EUA will remain  in effect  (meaning this test can be used) for the duration of the COVID-19 declaration under Section 564(b)(1) of the Act, 21 U.S.C.section 360bbb-3(b)(1), unless the authorization is terminated  or revoked sooner.       Influenza A by PCR NEGATIVE NEGATIVE Final   Influenza B by PCR NEGATIVE NEGATIVE Final    Comment: (NOTE) The Xpert Xpress SARS-CoV-2/FLU/RSV plus assay is intended as an aid in the diagnosis of influenza from Nasopharyngeal swab specimens and should not be used as a sole basis for treatment. Nasal washings and aspirates are unacceptable for Xpert Xpress SARS-CoV-2/FLU/RSV testing.  Fact Sheet for Patients: BloggerCourse.com  Fact Sheet for Healthcare Providers: SeriousBroker.it  This test is not yet approved or cleared by the [place] FDA and has been authorized for detection and/or diagnosis of SARS-CoV-2 by FDA under an Emergency Use Authorization (EUA). This EUA will remain in effect (meaning this test can be used) for the duration of the COVID-19 declaration under Section 564(b)(1) of the Act, 21 U.S.C. section 360bbb-3(b)(1), unless the authorization is terminated or revoked.     Resp Syncytial Virus by PCR NEGATIVE NEGATIVE Final    Comment: (NOTE) Fact Sheet for Patients: BloggerCourse.com  Fact Sheet for Healthcare Providers: SeriousBroker.it  This test is not yet approved or cleared by the [place] FDA and has been authorized for detection and/or diagnosis of SARS-CoV-2 by FDA under an Emergency Use Authorization (EUA). This EUA will remain in effect (meaning this test can be used) for the duration of the COVID-19 declaration under Section 564(b)(1) of the Act, 21 U.S.C. section 360bbb-3(b)(1), unless the authorization is terminated or revoked.  Performed at [facility], [address] [place], [place] [address]     Labs: CBC: Recent Labs   Lab 08/19/22 1155 08/21/22 0518 08/22/22 0605  WBC 18.9* 19.4* 17.7*  NEUTROABS 15.6*  --   --   HGB 14.5 14.4 14.7  HCT 42.8 42.1 43.2  MCV 90.7 89.2 89.6  PLT 260 320 326   Basic Metabolic Panel: Recent Labs  Lab 08/19/22 1155 08/21/22 0518 08/22/22 0605  NA 134* 133* 133*  K 3.5 3.7 4.2  CL 96* 98 95*  CO2 GLUCOSE 143* 193* 223*  BUN 25* 34* 32*  CREATININE 1.29* 1.10 1.07  CALCIUM 8.9 8.6* 8.9  MG  --  2.4  --    Liver Function Tests: No results for input(s): "AST", "ALT", "ALKPHOS", "BILITOT", "PROT", "ALBUMIN" in the last 168 hours. CBG: No results for input(s): "GLUCAP" in the last 168 hours.  Discharge time spent: greater than 30 minutes.  Signed: Meredeth Ide, MD [facility] 08/22/2022

## 2022-11-16 ENCOUNTER — Telehealth: Payer: Self-pay | Admitting: Internal Medicine

## 2022-11-16 NOTE — Telephone Encounter (Signed)
FYI, patient is continuing care through the [place] [facility]. No authorization was approved for our office.

## 2023-04-26 ENCOUNTER — Encounter (HOSPITAL_COMMUNITY)
Admission: RE | Admit: 2023-04-26 | Discharge: 2023-04-26 | Disposition: A | Discharge status: Home / Self Care | Payer: No Typology Code available for payment source | Source: Ambulatory Visit

## 2023-04-26 DIAGNOSIS — J449 Chronic obstructive pulmonary disease, unspecified: Secondary | ICD-10-CM | POA: Insufficient documentation

## 2023-05-03 ENCOUNTER — Encounter (HOSPITAL_COMMUNITY)
Admission: RE | Admit: 2023-05-03 | Discharge: 2023-05-03 | Disposition: A | Discharge status: Home / Self Care | Payer: No Typology Code available for payment source | Source: Ambulatory Visit

## 2023-05-03 VITALS — Ht 66.0 in | Wt 184.5 lb

## 2023-05-03 DIAGNOSIS — J449 Chronic obstructive pulmonary disease, unspecified: Secondary | ICD-10-CM

## 2023-05-03 NOTE — Patient Instructions (Signed)
Patient Instructions  Patient Details  Name: Chad Hensley MRN: [id number] Date of Birth: Mar 28, 1951 Referring Provider:  [facility]  Below are your personal goals for exercise, nutrition, and risk factors. Our goal is to help you stay on track towards obtaining and maintaining these goals. We will be discussing your progress on these goals with you throughout the program.  Initial Exercise Prescription:  Initial Exercise Prescription - 05/03/23 1500       Date of Initial Exercise RX and Referring Provider   Date 05/03/23    Referring Provider [facility]     Oxygen   Oxygen Continuous    Liters 3    Maintain Oxygen Saturation 88% or higher      NuStep   Level 1    SPM 80    Minutes 15    METs 2.1      REL-XR   Level 1    Speed 50    Minutes 15    METs 2.1      Track   Laps 10    Minutes 15    METs 2      Prescription Details   Frequency (times per week) 2    Duration Progress to 30 minutes of continuous aerobic without signs/symptoms of physical distress      Intensity   THRR 40-80% of Max Heartrate 91-131    Ratings of Perceived Exertion 11-13    Perceived Dyspnea 0-4      Progression   Progression Continue to progress workloads to maintain intensity without signs/symptoms of physical distress.      Resistance Training   Training Prescription Yes    Weight 3 lb    Reps 10-15             Exercise Goals: Frequency: Be able to perform aerobic exercise two to three times per week in program working toward 2-5 days per week of home exercise.  Intensity: Work with a perceived exertion of 11 (fairly light) - 15 (hard) while following your exercise prescription.  We will make changes to your prescription with you as you progress through the program.   Duration: Be able to do 30 to 45 minutes of continuous aerobic exercise in addition to a 5 minute warm-up and a 5 minute cool-down routine.   Nutrition Goals: Your personal  nutrition goals will be established when you do your nutrition analysis with the dietician.  The following are general nutrition guidelines to follow: Cholesterol < 200mg /day Sodium < 1500mg /day Fiber: Men over 50 yrs - 30 grams per day  Personal Goals:  Personal Goals and Risk Factors at Admission - 05/03/23 1535       Core Components/Risk Factors/Patient Goals on Admission    Weight Management Yes;Weight Loss    Intervention Weight Management: Develop a combined nutrition and exercise program designed to reach desired caloric intake, while maintaining appropriate intake of nutrient and fiber, sodium and fats, and appropriate energy expenditure required for the weight goal.;Weight Management: Provide education and appropriate resources to help participant work on and attain dietary goals.;Weight Management/Obesity: Establish reasonable short term and long term weight goals.    Admit Weight 184 lb 8 oz (83.7 kg)    Goal Weight: Short Term 179 lb (81.2 kg)    Goal Weight: Long Term 165 lb (74.8 kg)    Expected Outcomes Short Term: Continue to assess and modify interventions until short term weight is achieved;Long Term: Adherence to nutrition and physical  activity/exercise program aimed toward attainment of established weight goal;Weight Loss: Understanding of general recommendations for a balanced deficit meal plan, which promotes 1-2 lb weight loss per week and includes a negative energy balance of [phone] kcal/d;Understanding recommendations for meals to include 15-35% energy as protein, 25-35% energy from fat, 35-60% energy from carbohydrates, less than 200mg  of dietary cholesterol, 20-35 gm of total fiber daily;Understanding of distribution of calorie intake throughout the day with the consumption of 4-5 meals/snacks    Improve shortness of breath with ADL's Yes    Expected Outcomes Short Term: Improve cardiorespiratory fitness to achieve a reduction of symptoms when performing ADLs;Long  Term: Be able to perform more ADLs without symptoms or delay the onset of symptoms    Increase knowledge of respiratory medications and ability to use respiratory devices properly  Yes    Intervention Provide education and demonstration as needed of appropriate use of medications, inhalers, and oxygen therapy.    Expected Outcomes Short Term: Achieves understanding of medications use. Understands that oxygen is a medication prescribed by physician. Demonstrates appropriate use of inhaler and oxygen therapy.;Long Term: Maintain appropriate use of medications, inhalers, and oxygen therapy.    Intervention Provide education on lifestyle modifcations including regular physical activity/exercise, weight management, moderate sodium restriction and increased consumption of fresh fruit, vegetables, and low fat dairy, alcohol moderation, and smoking cessation.;Monitor prescription use compliance.    Expected Outcomes Short Term: Continued assessment and intervention until BP is < 140/49mm HG in hypertensive participants. < 130/32mm HG in hypertensive participants with diabetes, heart failure or chronic kidney disease.;Long Term: Maintenance of blood pressure at goal levels.    Lipids Yes    Intervention Provide education and support for participant on nutrition & aerobic/resistive exercise along with prescribed medications to achieve LDL 70mg , HDL >40mg .    Expected Outcomes Short Term: Participant states understanding of desired cholesterol values and is compliant with medications prescribed. Participant is following exercise prescription and nutrition guidelines.;Long Term: Cholesterol controlled with medications as prescribed, with individualized exercise RX and with personalized nutrition plan. Value goals: LDL < 70mg , HDL > 40 mg.             Tobacco Use Initial Evaluation: Social History   Tobacco Use  Smoking Status Former   Current packs/day: 0.00   Average packs/day: 0.5 packs/day for 2.0  years (1.0 ttl pk-yrs)   Types: Cigarettes   Start date: 04/03/1969   Quit date: 05/04/1970   Years since quitting: 53.0  Smokeless Tobacco Not on file    Exercise Goals and Review:  Exercise Goals     Row Name 05/03/23 1534             Exercise Goals   Increase Physical Activity Yes       Intervention Provide advice, education, support and counseling about physical activity/exercise needs.;Develop an individualized exercise prescription for aerobic and resistive training based on initial evaluation findings, risk stratification, comorbidities and participant's personal goals.       Expected Outcomes Short Term: Attend rehab on a regular basis to increase amount of physical activity.;Long Term: Add in home exercise to make exercise part of routine and to increase amount of physical activity.;Long Term: Exercising regularly at least 3-5 days a week.       Increase Strength and Stamina Yes       Intervention Provide advice, education, support and counseling about physical activity/exercise needs.;Develop an individualized exercise prescription for aerobic and resistive training based on initial evaluation findings, risk  stratification, comorbidities and participant's personal goals.       Expected Outcomes Short Term: Increase workloads from initial exercise prescription for resistance, speed, and METs.;Short Term: Perform resistance training exercises routinely during rehab and add in resistance training at home;Long Term: Improve cardiorespiratory fitness, muscular endurance and strength as measured by increased METs and functional capacity ( )       Able to understand and use rate of perceived exertion (RPE) scale Yes       Intervention Provide education and explanation on how to use RPE scale       Expected Outcomes Short Term: Able to use RPE daily in rehab to express subjective intensity level;Long Term:  Able to use RPE to guide intensity level when exercising independently       Able  to understand and use Dyspnea scale Yes       Intervention Provide education and explanation on how to use Dyspnea scale       Expected Outcomes Short Term: Able to use Dyspnea scale daily in rehab to express subjective sense of shortness of breath during exertion;Long Term: Able to use Dyspnea scale to guide intensity level when exercising independently       Knowledge and understanding of Target Heart Rate Range (THRR) Yes       Intervention Provide education and explanation of THRR including how the numbers were predicted and where they are located for reference       Expected Outcomes Short Term: Able to state/look up THRR;Long Term: Able to use THRR to govern intensity when exercising independently;Short Term: Able to use daily as guideline for intensity in rehab       Able to check pulse independently Yes       Intervention Review the importance of being able to check your own pulse for safety during independent exercise;Provide education and demonstration on how to check pulse in carotid and radial arteries.       Expected Outcomes Short Term: Able to explain why pulse checking is important during independent exercise;Long Term: Able to check pulse independently and accurately       Understanding of Exercise Prescription Yes       Intervention Provide education, explanation, and written materials on patient's individual exercise prescription       Expected Outcomes Short Term: Able to explain program exercise prescription;Long Term: Able to explain home exercise prescription to exercise independently              Copy of goals given to participant.

## 2023-05-03 NOTE — Progress Notes (Addendum)
Pulmonary Individual Treatment Plan  Patient Details  Name: Chad [facility] MRN: [id number] Date of Birth: 12/15/1950 Referring Provider:   Flowsheet Row PULMONARY REHAB COPD ORIENTATION from 05/03/2023 in [facility] CARDIAC REHABILITATION  Referring Provider Briones, [facility]]       Initial Encounter Date:  Flowsheet Row PULMONARY REHAB COPD ORIENTATION from 05/03/2023 in [place] CARDIAC REHABILITATION  Date 05/03/23       Visit Diagnosis: Chronic obstructive pulmonary disease, unspecified COPD type (HCC)  Patient's Home Medications on Admission:   Current Outpatient Medications:    acetaminophen (TYLENOL) 500 MG tablet, Take 2 tablets (1,000 mg total) by mouth every 8 (eight) hours as needed for headache., Disp: , Rfl:    ALBUTEROL IN, Inhale into the lungs as needed., Disp: , Rfl:    amLODipine (NORVASC) 5 MG tablet, Take 5 mg by mouth daily. If BP over 130/85, Disp: , Rfl:    ascorbic acid (VITAMIN C) 500 MG tablet, Take 500 mg by mouth daily., Disp: , Rfl:    aspirin EC 81 MG tablet, Take 1 tablet (81 mg total) by mouth daily. Swallow whole., Disp: 30 tablet, Rfl: 12   atorvastatin (LIPITOR) 20 MG tablet, Take 20 mg by mouth daily., Disp: , Rfl:    buPROPion (WELLBUTRIN SR) 150 MG 12 hr tablet, Take 150 mg by mouth 2 (two) times daily., Disp: , Rfl:    carvedilol (COREG) 12.5 MG tablet, Take 1 tablet (12.5 mg total) by mouth 2 (two) times daily with a meal., Disp: 60 tablet, Rfl: 2   cetirizine (ZYRTEC) 10 MG tablet, Take 10 mg by mouth daily., Disp: , Rfl:    famotidine (PEPCID) 10 MG tablet, Take 2 tablets (20 mg total) by mouth at bedtime., Disp: , Rfl:    fluticasone-salmeterol (WIXELA INHUB) 250-50 MCG/ACT AEPB, Inhale 1 puff into the lungs in the morning and at bedtime., Disp: , Rfl:    hydrOXYzine (ATARAX) 10 MG tablet, Take 10 mg by mouth 3 (three) times daily as needed., Disp: , Rfl:    Omega-3 Fatty Acids (FISH OIL PO), Take 1 capsule by mouth 2 (two)  times daily., Disp: , Rfl:    omeprazole (PRILOSEC) 20 MG capsule, Take 20 mg by mouth daily., Disp: , Rfl:    prazosin (MINIPRESS) 2 MG capsule, Take 2 mg by mouth at bedtime., Disp: , Rfl:    predniSONE (DELTASONE) 10 MG tablet, Prednisone 40 mg po daily x 1 day then Prednisone 30 mg po daily x 1 day then Prednisone 20 mg po daily x 1 day then Prednisone 10 mg daily x 1 day then stop..., Disp: 10 tablet, Rfl: 0   Tiotropium Bromide-Olodaterol (STIOLTO RESPIMAT) 2.5-2.5 MCG/ACT AERS, Inhale into the lungs daily., Disp: , Rfl:    vitamin B-12 (CYANOCOBALAMIN) 50 MCG tablet, Take 50 mcg by mouth daily., Disp: , Rfl:    Cholecalciferol 2000 UNITS TABS, Take by mouth daily., Disp: , Rfl:    doxycycline (VIBRA-TABS) 100 MG tablet, Take 1 tablet (100 mg total) by mouth every 12 (twelve) hours., Disp: 8 tablet, Rfl: 0   furosemide (LASIX) 20 MG tablet, Take 1 tablet (20 mg total) by mouth daily., Disp: 30 tablet, Rfl: 3   guaiFENesin (MUCINEX) 600 MG 12 hr tablet, Take 1 tablet (600 mg total) by mouth 2 (two) times daily., Disp: 10 tablet, Rfl: 0   isosorbide mononitrate (IMDUR) 30 MG 24 hr tablet, Take 1 tablet (30 mg total) by mouth daily., Disp: 30 tablet,  Rfl: 2   losartan (COZAAR) 50 MG tablet, Take 1 tablet (50 mg total) by mouth daily., Disp: 30 tablet, Rfl: 2  Past Medical History: Past Medical History:  Diagnosis Date   PTSD (post-traumatic stress disorder) 05/04/1976    Tobacco Use: Social History   Tobacco Use  Smoking Status Former   Current packs/day: 0.00   Average packs/day: 0.5 packs/day for 2.0 years (1.0 ttl pk-yrs)   Types: Cigarettes   Start date: 04/03/1969   Quit date: 05/04/1970   Years since quitting: 53.0  Smokeless Tobacco Not on file    Labs: Review Flowsheet       Latest Ref Rng & Units 05/01/2008 02/15/2022 02/16/2022  Labs for ITP Cardiac and Pulmonary Rehab  Cholestrol 0 - 200 mg/dL - - 324   LDL (calc) 0 - 99 mg/dL - - 73   HDL-C >40 mg/dL - - 33    Trlycerides <102 mg/dL - - 725   Hemoglobin D6U 4.8 - 5.6 % - 5.4  -  TCO2 0 - 100 mmol/L 25  - -    Capillary Blood Glucose: No results found for: "GLUCAP"   Pulmonary Assessment Scores:  Pulmonary Assessment Scores     Row Name 05/03/23 1538         ADL UCSD   ADL Phase Entry     SOB Score total 100     Rest 2     Walk 3     Stairs 5     Bath 4     Dress 3     Shop 3       CAT Score   CAT Score 21       mMRC Score   mMRC Score 3             UCSD: Self-administered rating of dyspnea associated with activities of daily living (ADLs) 6-point scale (0 = "not at all" to 5 = "maximal or unable to do because of breathlessness")  Scoring Scores range from 0 to 120.  Minimally important difference is 5 units  CAT: CAT can identify the health impairment of COPD patients and is better correlated with disease progression.  CAT has a scoring range of zero to 40. The CAT score is classified into four groups of low (less than 10), medium (10 - 20), high (21-30) and very high (31-40) based on the impact level of disease on health status. A CAT score over 10 suggests significant symptoms.  A worsening CAT score could be explained by an exacerbation, poor medication adherence, poor inhaler technique, or progression of COPD or comorbid conditions.  CAT MCID is 2 points  mMRC: mMRC (Modified Medical Research Council) Dyspnea Scale is used to assess the degree of baseline functional disability in patients of respiratory disease due to dyspnea. No minimal important difference is established. A decrease in score of 1 point or greater is considered a positive change.   Pulmonary Function Assessment:  Pulmonary Function Assessment - 05/03/23 1537       Initial Spirometry Results   FVC% 82.5 %    FEV1% 39.4 %    FEV1/FVC Ratio 36    Comments 12/15/2022      Post Bronchodilator Spirometry Results   FVC% 102.5 %    FEV1% 40.2 %    FEV1/FVC Ratio 30    Comments 12/15/22       Breath   Shortness of Breath Yes;Fear of Shortness of Breath;Limiting activity;Panic with Shortness of Breath  Exercise Target Goals: Exercise Program Goal: Individual exercise prescription set using results from initial 6 min walk test and THRR while considering  patient's activity barriers and safety.   Exercise Prescription Goal: Initial exercise prescription builds to 30-45 minutes a day of aerobic activity, 2-3 days per week.  Home exercise guidelines will be given to patient during program as part of exercise prescription that the participant will acknowledge.  Activity Barriers & Risk Stratification:  Activity Barriers & Cardiac Risk Stratification - 04/26/23 1323       Activity Barriers & Cardiac Risk Stratification   Activity Barriers Deconditioning;Shortness of Breath;Joint Problems   Left shoulder joint replaced 3 years ago.   Cardiac Risk Stratification Low             6 Minute Walk:  6 Minute Walk     Row Name 05/03/23 1531         6 Minute Walk   Phase Initial     Distance 940 feet     Walk Time 6 minutes     # of Rest Breaks 0     MPH 1.78     METS 2.15     RPE 14     Perceived Dyspnea  2     VO2 Peak 7.54     Symptoms Yes (comment)     Comments SOB, chest tightness 3/10, carrying small e-cylinder     Resting HR 68 bpm     Resting BP 118/66     Resting Oxygen Saturation  93 %     Exercise Oxygen Saturation  during 6 min walk 86 %     Max Ex. HR 106 bpm     Max Ex. BP 148/84     2 Minute Post BP 122/64       Interval HR   1 Minute HR 92     2 Minute HR 106     3 Minute HR 100     4 Minute HR 98     5 Minute HR 102     6 Minute HR 99     2 Minute Post HR 81     Interval Heart Rate? Yes       Interval Oxygen   Interval Oxygen? Yes     Baseline Oxygen Saturation % 93 %  1L NCC     1 Minute Oxygen Saturation % 90 %     1 Minute Liters of Oxygen 3 L  NCP     2 Minute Oxygen Saturation % 87 %     2 Minute Liters of Oxygen 3 L      3 Minute Oxygen Saturation % 86 %     3 Minute Liters of Oxygen 3 L     4 Minute Oxygen Saturation % 86 %     4 Minute Liters of Oxygen 3 L     5 Minute Oxygen Saturation % 87 %     5 Minute Liters of Oxygen 3 L     6 Minute Oxygen Saturation % 86 %     6 Minute Liters of Oxygen 3 L     2 Minute Post Oxygen Saturation % 94 %     2 Minute Post Liters of Oxygen 3 L              Oxygen Initial Assessment:  Oxygen Initial Assessment - 05/03/23 1536       Home Oxygen   Home Oxygen Device Home Concentrator;Portable Concentrator;E-Tanks  Sleep Oxygen Prescription Continuous    Liters per minute 2    Home Exercise Oxygen Prescription Continuous    Liters per minute 3    Home Resting Oxygen Prescription Continuous    Liters per minute 2    Compliance with Home Oxygen Use Yes      Initial 6 min Walk   Oxygen Used Pulsed;E-Tanks    Liters per minute 3      Program Oxygen Prescription   Program Oxygen Prescription Continuous;E-Tanks    Liters per minute 3      Intervention   Short Term Goals To learn and exhibit compliance with exercise, home and travel O2 prescription;To learn and understand importance of monitoring SPO2 with pulse oximeter and demonstrate accurate use of the pulse oximeter.;To learn and understand importance of maintaining oxygen saturations>88%;To learn and demonstrate proper pursed lip breathing techniques or other breathing techniques. ;To learn and demonstrate proper use of respiratory medications    Long  Term Goals Exhibits compliance with exercise, home  and travel O2 prescription;Verbalizes importance of monitoring SPO2 with pulse oximeter and return demonstration;Maintenance of O2 saturations>88%;Exhibits proper breathing techniques, such as pursed lip breathing or other method taught during program session;Compliance with respiratory medication;Demonstrates proper use of MDI's             Oxygen Re-Evaluation:   Oxygen Discharge (Final  Oxygen Re-Evaluation):   Initial Exercise Prescription:  Initial Exercise Prescription - 05/03/23 1500       Date of Initial Exercise RX and Referring Provider   Date 05/03/23    Referring Provider [facility]     Oxygen   Oxygen Continuous    Liters 3    Maintain Oxygen Saturation 88% or higher      NuStep   Level 1    SPM 80    Minutes 15    METs 2.1      REL-XR   Level 1    Speed 50    Minutes 15    METs 2.1      Track   Laps 10    Minutes 15    METs 2      Prescription Details   Frequency (times per week) 2    Duration Progress to 30 minutes of continuous aerobic without signs/symptoms of physical distress      Intensity   THRR 40-80% of Max Heartrate 91-131    Ratings of Perceived Exertion 11-13    Perceived Dyspnea 0-4      Progression   Progression Continue to progress workloads to maintain intensity without signs/symptoms of physical distress.      Resistance Training   Training Prescription Yes    Weight 3 lb    Reps 10-15             Perform Capillary Blood Glucose checks as needed.  Exercise Prescription Changes:   Exercise Prescription Changes     Row Name 05/03/23 1500             Response to Exercise   Blood Pressure (Admit) 118/66       Blood Pressure (Exercise) 148/84       Blood Pressure (Exit) 112/66       Heart Rate (Admit) 68 bpm       Heart Rate (Exercise) 106 bpm       Heart Rate (Exit) 80 bpm       Oxygen Saturation (Admit) 93 %       Oxygen  Saturation (Exercise) 86 %       Oxygen Saturation (Exit) 93 %       Rating of Perceived Exertion (Exercise) 14       Perceived Dyspnea (Exercise) 2       Symptoms SOB, chest tightness 3/10       Comments walk test results                Exercise Comments:   Exercise Goals and Review:   Exercise Goals     Row Name 05/03/23 1534             Exercise Goals   Increase Physical Activity Yes       Intervention Provide advice, education,  support and counseling about physical activity/exercise needs.;Develop an individualized exercise prescription for aerobic and resistive training based on initial evaluation findings, risk stratification, comorbidities and participant's personal goals.       Expected Outcomes Short Term: Attend rehab on a regular basis to increase amount of physical activity.;Long Term: Add in home exercise to make exercise part of routine and to increase amount of physical activity.;Long Term: Exercising regularly at least 3-5 days a week.       Increase Strength and Stamina Yes       Intervention Provide advice, education, support and counseling about physical activity/exercise needs.;Develop an individualized exercise prescription for aerobic and resistive training based on initial evaluation findings, risk stratification, comorbidities and participant's personal goals.       Expected Outcomes Short Term: Increase workloads from initial exercise prescription for resistance, speed, and METs.;Short Term: Perform resistance training exercises routinely during rehab and add in resistance training at home;Long Term: Improve cardiorespiratory fitness, muscular endurance and strength as measured by increased METs and functional capacity ( )       Able to understand and use rate of perceived exertion (RPE) scale Yes       Intervention Provide education and explanation on how to use RPE scale       Expected Outcomes Short Term: Able to use RPE daily in rehab to express subjective intensity level;Long Term:  Able to use RPE to guide intensity level when exercising independently       Able to understand and use Dyspnea scale Yes       Intervention Provide education and explanation on how to use Dyspnea scale       Expected Outcomes Short Term: Able to use Dyspnea scale daily in rehab to express subjective sense of shortness of breath during exertion;Long Term: Able to use Dyspnea scale to guide intensity level when exercising  independently       Knowledge and understanding of Target Heart Rate Range (THRR) Yes       Intervention Provide education and explanation of THRR including how the numbers were predicted and where they are located for reference       Expected Outcomes Short Term: Able to state/look up THRR;Long Term: Able to use THRR to govern intensity when exercising independently;Short Term: Able to use daily as guideline for intensity in rehab       Able to check pulse independently Yes       Intervention Review the importance of being able to check your own pulse for safety during independent exercise;Provide education and demonstration on how to check pulse in carotid and radial arteries.       Expected Outcomes Short Term: Able to explain why pulse checking is important during independent exercise;Long Term: Able to check pulse independently  and accurately       Understanding of Exercise Prescription Yes       Intervention Provide education, explanation, and written materials on patient's individual exercise prescription       Expected Outcomes Short Term: Able to explain program exercise prescription;Long Term: Able to explain home exercise prescription to exercise independently                Exercise Goals Re-Evaluation :   Discharge Exercise Prescription (Final Exercise Prescription Changes):  Exercise Prescription Changes - 05/03/23 1500       Response to Exercise   Blood Pressure (Admit) 118/66    Blood Pressure (Exercise) 148/84    Blood Pressure (Exit) 112/66    Heart Rate (Admit) 68 bpm    Heart Rate (Exercise) 106 bpm    Heart Rate (Exit) 80 bpm    Oxygen Saturation (Admit) 93 %    Oxygen Saturation (Exercise) 86 %    Oxygen Saturation (Exit) 93 %    Rating of Perceived Exertion (Exercise) 14    Perceived Dyspnea (Exercise) 2    Symptoms SOB, chest tightness 3/10    Comments walk test results             Nutrition:  Target Goals: Understanding of nutrition guidelines,  daily intake of sodium 1500mg , cholesterol 200mg , calories 30% from fat and 7% or less from saturated fats, daily to have 5 or more servings of fruits and vegetables.  Biometrics:  Pre Biometrics - 05/03/23 1534       Pre Biometrics   Height 5\' 6"  (1.676 m)    Weight 83.7 kg    Waist Circumference 40.5 inches    Hip Circumference 40 inches    Waist to Hip Ratio 1.01 %    BMI (Calculated) 29.79    Grip Strength 32.1 kg    Single Leg Stand 4.4 seconds              Nutrition Therapy Plan and Nutrition Goals:  Nutrition Therapy & Goals - 05/03/23 1535       Intervention Plan   Intervention Prescribe, educate and counsel regarding individualized specific dietary modifications aiming towards targeted core components such as weight, hypertension, lipid management, diabetes, heart failure and other comorbidities.;Nutrition handout(s) given to patient.    Expected Outcomes Short Term Goal: Understand basic principles of dietary content, such as calories, fat, sodium, cholesterol and nutrients.;Long Term Goal: Adherence to prescribed nutrition plan.             Nutrition Assessments:  MEDIFICTS Score Key: >=70 Need to make dietary changes  40-70 Heart Healthy Diet <= 40 Therapeutic Level Cholesterol Diet  Flowsheet Row PULMONARY REHAB COPD ORIENTATION from 05/03/2023 in [facility] RE[facility]ILITATION  Picture Your Plate Total Score on Admission 59      Picture Your Plate Scores: <82 Unhealthy dietary pattern with much room for improvement. 41-50 Dietary pattern unlikely to meet recommendations for good health and room for improvement. 51-60 More healthful dietary pattern, with some room for improvement.  >60 Healthy dietary pattern, although there may be some specific behaviors that could be improved.    Nutrition Goals Re-Evaluation:   Nutrition Goals Discharge (Final Nutrition Goals Re-Evaluation):   Psychosocial: Target Goals: Acknowledge presence or  absence of significant depression and/or stress, maximize coping skills, provide positive support system. Participant is able to verbalize types and ability to use techniques and skills needed for reducing stress and depression.  Initial Review & Psychosocial Screening:  Initial  Psych Review & Screening - 04/26/23 1340       Initial Review   Current issues with Current Sleep Concerns;Current Psychotropic Meds;History of Depression;Current Depression   PSTD with visual hallucinations     Family Dynamics   Good Support System? Yes    Comments His 2 daughters are his support.      Barriers   Psychosocial barriers to participate in program The patient should benefit from training in stress management and relaxation.      Screening Interventions   Interventions Encouraged to exercise;To provide support and resources with identified psychosocial needs;Provide feedback about the scores to participant    Expected Outcomes Short Term goal: Utilizing psychosocial counselor, staff and physician to assist with identification of specific Stressors or current issues interfering with healing process. Setting desired goal for each stressor or current issue identified.;Long Term Goal: Stressors or current issues are controlled or eliminated.;Short Term goal: Identification and review with participant of any Quality of Life or Depression concerns found by scoring the questionnaire.;Long Term goal: The participant improves quality of Life and PHQ9 Scores as seen by post scores and/or verbalization of changes             Quality of Life Scores:  Scores of 19 and below usually indicate a poorer quality of life in these areas.  A difference of  2-3 points is a clinically meaningful difference.  A difference of 2-3 points in the total score of the Quality of Life Index has been associated with significant improvement in overall quality of life, self-image, physical symptoms, and general health in studies  assessing change in quality of life.   PHQ-9: Review Flowsheet       05/03/2023  Depression screen PHQ 2/9  Decreased Interest 2  Down, Depressed, Hopeless 2  PHQ - 2 Score 4  Altered sleeping 2  Tired, decreased energy 3  Change in appetite 2  Feeling bad or failure about yourself  2  Trouble concentrating 3  Moving slowly or fidgety/restless 2  Suicidal thoughts 0  PHQ-9 Score 18  Difficult doing work/chores Very difficult   Interpretation of Total Score  Total Score Depression Severity:  1-4 = Minimal depression, 5-9 = Mild depression, 10-14 = Moderate depression, 15-19 = Moderately severe depression, 20-27 = Severe depression   Psychosocial Evaluation and Intervention:  Psychosocial Evaluation - 04/26/23 1343       Psychosocial Evaluation & Interventions   Interventions Stress management education;Relaxation education;Encouraged to exercise with the program and follow exercise prescription    Comments Patient was referred to [place] with COPD from the [place]. He served in [place] and does report some depression as well as PTSD with occasionally visual and auditory hallucinations which he says is treated with Buspar. He says his daughter was living with him but she has moved out and he now lives alone which has been a diffucult ajustment for him. He has 2 daughters that do support him with his needs. He says he does attend church which helps him socially. He reports trouble staying asleep mainly due to feelings of choking on sinus drainage. He used to work painting houses and he thinks the fumes from the paint and exposure to chemicals while serving in the [employer] damaged his lungs. He is walking some at [facility] but says he can't do a lot. His goals for the program are to improve his strength and stamina and breathing. He has no barriers identified to completing the program.    Expected Outcomes  Short Term: start the program and attend consistently. Long Term: meet his personal goals.     Continue Psychosocial Services  Follow up required by staff             Psychosocial Re-Evaluation:   Psychosocial Discharge (Final Psychosocial Re-Evaluation):    Education: Education Goals: Education classes will be provided on a weekly basis, covering required topics. Participant will state understanding/return demonstration of topics presented.  Learning Barriers/Preferences:  Learning Barriers/Preferences - 04/26/23 1327       Learning Barriers/Preferences   Learning Barriers None    Learning Preferences Skilled Demonstration;Audio;Written Material             Education Topics: How Lungs Work and Diseases: - Discuss the anatomy of the lungs and diseases that can affect the lungs, such as COPD.   Exercise: -Discuss the importance of exercise, FITT principles of exercise, normal and abnormal responses to exercise, and how to exercise safely.   Environmental Irritants: -Discuss types of environmental irritants and how to limit exposure to environmental irritants.   Meds/Inhalers and oxygen: - Discuss respiratory medications, definition of an inhaler and oxygen, and the proper way to use an inhaler and oxygen.   Energy Saving Techniques: - Discuss methods to conserve energy and decrease shortness of breath when performing activities of daily living.    Bronchial Hygiene / Breathing Techniques: - Discuss breathing mechanics, pursed-lip breathing technique,  proper posture, effective ways to clear airways, and other functional breathing techniques   Cleaning Equipment: - Provides group verbal and written instruction about the health risks of elevated stress, cause of high stress, and healthy ways to reduce stress.   Nutrition I: Fats: - Discuss the types of cholesterol, what cholesterol does to the body, and how cholesterol levels can be controlled.   Nutrition II: Labels: -Discuss the different components of food labels and how to read food  labels.   Respiratory Infections: - Discuss the signs and symptoms of respiratory infections, ways to prevent respiratory infections, and the importance of seeking medical treatment when having a respiratory infection.   Stress I: Signs and Symptoms: - Discuss the causes of stress, how stress may lead to anxiety and depression, and ways to limit stress.   Stress II: Relaxation: -Discuss relaxation techniques to limit stress.   Oxygen for Home/Travel: - Discuss how to prepare for travel when on oxygen and proper ways to transport and store oxygen to ensure safety.   Knowledge Questionnaire Score:  Knowledge Questionnaire Score - 05/03/23 1535       Knowledge Questionnaire Score   Pre Score 11/18             Core Components/Risk Factors/Patient Goals at Admission:  Personal Goals and Risk Factors at Admission - 05/03/23 1535       Core Components/Risk Factors/Patient Goals on Admission    Weight Management Yes;Weight Loss    Intervention Weight Management: Develop a combined nutrition and exercise program designed to reach desired caloric intake, while maintaining appropriate intake of nutrient and fiber, sodium and fats, and appropriate energy expenditure required for the weight goal.;Weight Management: Provide education and appropriate resources to help participant work on and attain dietary goals.;Weight Management/Obesity: Establish reasonable short term and long term weight goals.    Admit Weight 184 lb 8 oz (83.7 kg)    Goal Weight: Short Term 179 lb (81.2 kg)    Goal Weight: Long Term 165 lb (74.8 kg)    Expected Outcomes Short Term: Continue to assess  and modify interventions until short term weight is achieved;Long Term: Adherence to nutrition and physical activity/exercise program aimed toward attainment of established weight goal;Weight Loss: Understanding of general recommendations for a balanced deficit meal plan, which promotes 1-2 lb weight loss per week and  includes a negative energy balance of [phone] kcal/d;Understanding recommendations for meals to include 15-35% energy as protein, 25-35% energy from fat, 35-60% energy from carbohydrates, less than 200mg  of dietary cholesterol, 20-35 gm of total fiber daily;Understanding of distribution of calorie intake throughout the day with the consumption of 4-5 meals/snacks    Improve shortness of breath with ADL's Yes    Expected Outcomes Short Term: Improve cardiorespiratory fitness to achieve a reduction of symptoms when performing ADLs;Long Term: Be able to perform more ADLs without symptoms or delay the onset of symptoms    Increase knowledge of respiratory medications and ability to use respiratory devices properly  Yes    Intervention Provide education and demonstration as needed of appropriate use of medications, inhalers, and oxygen therapy.    Expected Outcomes Short Term: Achieves understanding of medications use. Understands that oxygen is a medication prescribed by physician. Demonstrates appropriate use of inhaler and oxygen therapy.;Long Term: Maintain appropriate use of medications, inhalers, and oxygen therapy.    Intervention Provide education on lifestyle modifcations including regular physical activity/exercise, weight management, moderate sodium restriction and increased consumption of fresh fruit, vegetables, and low fat dairy, alcohol moderation, and smoking cessation.;Monitor prescription use compliance.    Expected Outcomes Short Term: Continued assessment and intervention until BP is < 140/39mm HG in hypertensive participants. < 130/103mm HG in hypertensive participants with diabetes, heart failure or chronic kidney disease.;Long Term: Maintenance of blood pressure at goal levels.    Lipids Yes    Intervention Provide education and support for participant on nutrition & aerobic/resistive exercise along with prescribed medications to achieve LDL 70mg , HDL >40mg .    Expected Outcomes Short  Term: Participant states understanding of desired cholesterol values and is compliant with medications prescribed. Participant is following exercise prescription and nutrition guidelines.;Long Term: Cholesterol controlled with medications as prescribed, with individualized exercise RX and with personalized nutrition plan. Value goals: LDL < 70mg , HDL > 40 mg.             Core Components/Risk Factors/Patient Goals Review:    Core Components/Risk Factors/Patient Goals at Discharge (Final Review):    ITP Comments:  ITP Comments     Row Name 05/03/23 1530           ITP Comments Patient attend orientation today.  Patient is attending [facility].  Documentation for diagnosis can be found in [place] notes in Media for today's appointment.  Reviewed medical chart, RPE/RPD, gym safety, and program guidelines.  Patient was fitted to equipment they will be using during rehab.  Patient is scheduled to start exercise on Tues 05/11/23 at 1330.   Initial ITP created and sent for review and signature by Dr. Erick Blinks, Medical Director for Pulmonary Rehabilitation Program.                Comments: Initial ITP

## 2023-05-04 ENCOUNTER — Encounter (HOSPITAL_COMMUNITY): Payer: Self-pay | Admitting: *Deleted

## 2023-05-04 DIAGNOSIS — J449 Chronic obstructive pulmonary disease, unspecified: Secondary | ICD-10-CM

## 2023-05-04 NOTE — Progress Notes (Signed)
 Pulmonary Individual Treatment Plan  Patient Details  Name: Chad Hensley MRN: [id number] Date of Birth: 03-01-1951 Referring Provider:   Flowsheet Row PULMONARY REHAB COPD ORIENTATION from 05/03/2023 in [facility] CARDIAC REHABILITATION  Referring Provider Br[facility], [facility]]       Initial Encounter Date:  Flowsheet Row PULMONARY REHAB COPD ORIENTATION from 05/03/2023 in [facility] CARDIAC REHABILITATION  Date 05/03/23       Visit Diagnosis: Chronic obstructive pulmonary disease, unspecified COPD type (HCC)  Patient's Home Medications on Admission:   Current Outpatient Medications:    acetaminophen  (TYLENOL ) 500 MG tablet, Take 2 tablets (1,000 mg total) by mouth every 8 (eight) hours as needed for headache., Disp: , Rfl:    ALBUTEROL  IN, Inhale into the lungs as needed., Disp: , Rfl:    amLODipine (NORVASC) 5 MG tablet, Take 5 mg by mouth daily. If BP over 130/85, Disp: , Rfl:    ascorbic acid (VITAMIN C) 500 MG tablet, Take 500 mg by mouth daily., Disp: , Rfl:    aspirin  EC 81 MG tablet, Take 1 tablet (81 mg total) by mouth daily. Swallow whole., Disp: 30 tablet, Rfl: 12   atorvastatin  (LIPITOR) 20 MG tablet, Take 20 mg by mouth daily., Disp: , Rfl:    buPROPion  (WELLBUTRIN  SR) 150 MG 12 hr tablet, Take 150 mg by mouth 2 (two) times daily., Disp: , Rfl:    carvedilol  (COREG ) 12.5 MG tablet, Take 1 tablet (12.5 mg total) by mouth 2 (two) times daily with a meal., Disp: 60 tablet, Rfl: 2   cetirizine (ZYRTEC) 10 MG tablet, Take 10 mg by mouth daily., Disp: , Rfl:    Cholecalciferol 2000 UNITS TABS, Take by mouth daily., Disp: , Rfl:    doxycycline  (VIBRA -TABS) 100 MG tablet, Take 1 tablet (100 mg total) by mouth every 12 (twelve) hours., Disp: 8 tablet, Rfl: 0   famotidine  (PEPCID ) 10 MG tablet, Take 2 tablets (20 mg total) by mouth at bedtime., Disp: , Rfl:    fluticasone-salmeterol (WIXELA INHUB) 250-50 MCG/ACT AEPB, Inhale 1 puff into the lungs in the morning and at  bedtime., Disp: , Rfl:    furosemide  (LASIX ) 20 MG tablet, Take 1 tablet (20 mg total) by mouth daily., Disp: 30 tablet, Rfl: 3   guaiFENesin  (MUCINEX ) 600 MG 12 hr tablet, Take 1 tablet (600 mg total) by mouth 2 (two) times daily., Disp: 10 tablet, Rfl: 0   hydrOXYzine  (ATARAX ) 10 MG tablet, Take 10 mg by mouth 3 (three) times daily as needed., Disp: , Rfl:    isosorbide  mononitrate (IMDUR ) 30 MG 24 hr tablet, Take 1 tablet (30 mg total) by mouth daily., Disp: 30 tablet, Rfl: 2   losartan  (COZAAR ) 50 MG tablet, Take 1 tablet (50 mg total) by mouth daily., Disp: 30 tablet, Rfl: 2   Omega-3 Fatty Acids (FISH OIL PO), Take 1 capsule by mouth 2 (two) times daily., Disp: , Rfl:    omeprazole (PRILOSEC) 20 MG capsule, Take 20 mg by mouth daily., Disp: , Rfl:    prazosin  (MINIPRESS ) 2 MG capsule, Take 2 mg by mouth at bedtime., Disp: , Rfl:    predniSONE  (DELTASONE ) 10 MG tablet, Prednisone  40 mg po daily x 1 day then Prednisone  30 mg po daily x 1 day then Prednisone  20 mg po daily x 1 day then Prednisone  10 mg daily x 1 day then stop..., Disp: 10 tablet, Rfl: 0   Tiotropium Bromide-Olodaterol (STIOLTO RESPIMAT) 2.5-2.5 MCG/ACT AERS, Inhale into the lungs  daily., Disp: , Rfl:    vitamin B-12 (CYANOCOBALAMIN ) 50 MCG tablet, Take 50 mcg by mouth daily., Disp: , Rfl:   Past Medical History: Past Medical History:  Diagnosis Date   PTSD (post-traumatic stress disorder) 05/04/1976    Tobacco Use: Social History   Tobacco Use  Smoking Status Former   Current packs/day: 0.00   Average packs/day: 0.5 packs/day for 2.0 years (1.0 ttl pk-yrs)   Types: Cigarettes   Start date: 04/03/1969   Quit date: 05/04/1970   Years since quitting: 53.0  Smokeless Tobacco Not on file    Labs: Review Flowsheet       Latest Ref Rng & Units 05/01/2008 02/15/2022 02/16/2022  Labs for ITP Cardiac and Pulmonary Rehab  Cholestrol 0 - 200 mg/dL - - 868   LDL (calc) 0 - 99 mg/dL - - 73   HDL-C >59 mg/dL - - 33    Trlycerides <849 mg/dL - - 876   Hemoglobin J8r 4.8 - 5.6 % - 5.4  -  TCO2 0 - 100 mmol/L 25  - -    Capillary Blood Glucose: No results found for: GLUCAP   Pulmonary Assessment Scores:  Pulmonary Assessment Scores     Row Name 05/03/23 1538         ADL UCSD   ADL Phase Entry     SOB Score total 100     Rest 2     Walk 3     Stairs 5     Bath 4     Dress 3     Shop 3       CAT Score   CAT Score 21       mMRC Score   mMRC Score 3             UCSD: Self-administered rating of dyspnea associated with activities of daily living (ADLs) 6-point scale (0 = not at all to 5 = maximal or unable to do because of breathlessness)  Scoring Scores range from 0 to 120.  Minimally important difference is 5 units  CAT: CAT can identify the health impairment of COPD patients and is better correlated with disease progression.  CAT has a scoring range of zero to 40. The CAT score is classified into four groups of low (less than 10), medium (10 - 20), high (21-30) and very high (31-40) based on the impact level of disease on health status. A CAT score over 10 suggests significant symptoms.  A worsening CAT score could be explained by an exacerbation, poor medication adherence, poor inhaler technique, or progression of COPD or comorbid conditions.  CAT MCID is 2 points  mMRC: mMRC (Modified Medical Research Council) Dyspnea Scale is used to assess the degree of baseline functional disability in patients of respiratory disease due to dyspnea. No minimal important difference is established. A decrease in score of 1 point or greater is considered a positive change.   Pulmonary Function Assessment:  Pulmonary Function Assessment - 05/03/23 1537       Initial Spirometry Results   FVC% 82.5 %    FEV1% 39.4 %    FEV1/FVC Ratio 36    Comments 12/15/2022      Post Bronchodilator Spirometry Results   FVC% 102.5 %    FEV1% 40.2 %    FEV1/FVC Ratio 30    Comments 12/15/22       Breath   Shortness of Breath Yes;Fear of Shortness of Breath;Limiting activity;Panic with Shortness of Breath  Exercise Target Goals: Exercise Program Goal: Individual exercise prescription set using results from initial 6 min walk test and THRR while considering  patient's activity barriers and safety.   Exercise Prescription Goal: Initial exercise prescription builds to 30-45 minutes a day of aerobic activity, 2-3 days per week.  Home exercise guidelines will be given to patient during program as part of exercise prescription that the participant will acknowledge.  Activity Barriers & Risk Stratification:  Activity Barriers & Cardiac Risk Stratification - 04/26/23 1323       Activity Barriers & Cardiac Risk Stratification   Activity Barriers Deconditioning;Shortness of Breath;Joint Problems   Left shoulder joint replaced 3 years ago.   Cardiac Risk Stratification Low             6 Minute Walk:  6 Minute Walk     Row Name 05/03/23 1531         6 Minute Walk   Phase Initial     Distance 940 feet     Walk Time 6 minutes     # of Rest Breaks 0     MPH 1.78     METS 2.15     RPE 14     Perceived Dyspnea  2     VO2 Peak 7.54     Symptoms Yes (comment)     Comments SOB, chest tightness 3/10, carrying small e-cylinder     Resting HR 68 bpm     Resting BP 118/66     Resting Oxygen Saturation  93 %     Exercise Oxygen Saturation  during 6 min walk 86 %     Max Ex. HR 106 bpm     Max Ex. BP 148/84     2 Minute Post BP 122/64       Interval HR   1 Minute HR 92     2 Minute HR 106     3 Minute HR 100     4 Minute HR 98     5 Minute HR 102     6 Minute HR 99     2 Minute Post HR 81     Interval Heart Rate? Yes       Interval Oxygen   Interval Oxygen? Yes     Baseline Oxygen Saturation % 93 %  1L NCC     1 Minute Oxygen Saturation % 90 %     1 Minute Liters of Oxygen 3 L  NCP     2 Minute Oxygen Saturation % 87 %     2 Minute Liters of Oxygen 3 L      3 Minute Oxygen Saturation % 86 %     3 Minute Liters of Oxygen 3 L     4 Minute Oxygen Saturation % 86 %     4 Minute Liters of Oxygen 3 L     5 Minute Oxygen Saturation % 87 %     5 Minute Liters of Oxygen 3 L     6 Minute Oxygen Saturation % 86 %     6 Minute Liters of Oxygen 3 L     2 Minute Post Oxygen Saturation % 94 %     2 Minute Post Liters of Oxygen 3 L              Oxygen Initial Assessment:  Oxygen Initial Assessment - 05/03/23 1536       Home Oxygen   Home Oxygen Device Home Concentrator;Portable Concentrator;E-Tanks  Sleep Oxygen Prescription Continuous    Liters per minute 2    Home Exercise Oxygen Prescription Continuous    Liters per minute 3    Home Resting Oxygen Prescription Continuous    Liters per minute 2    Compliance with Home Oxygen Use Yes      Initial 6 min Walk   Oxygen Used Pulsed;E-Tanks    Liters per minute 3      Program Oxygen Prescription   Program Oxygen Prescription Continuous;E-Tanks    Liters per minute 3      Intervention   Short Term Goals To learn and exhibit compliance with exercise, home and travel O2 prescription;To learn and understand importance of monitoring SPO2 with pulse oximeter and demonstrate accurate use of the pulse oximeter.;To learn and understand importance of maintaining oxygen saturations>88%;To learn and demonstrate proper pursed lip breathing techniques or other breathing techniques. ;To learn and demonstrate proper use of respiratory medications    Long  Term Goals Exhibits compliance with exercise, home  and travel O2 prescription;Verbalizes importance of monitoring SPO2 with pulse oximeter and return demonstration;Maintenance of O2 saturations>88%;Exhibits proper breathing techniques, such as pursed lip breathing or other method taught during program session;Compliance with respiratory medication;Demonstrates proper use of MDI's             Oxygen Re-Evaluation:   Oxygen Discharge (Final  Oxygen Re-Evaluation):   Initial Exercise Prescription:  Initial Exercise Prescription - 05/03/23 1500       Date of Initial Exercise RX and Referring Provider   Date 05/03/23    Referring Provider [facility]     Oxygen   Oxygen Continuous    Liters 3    Maintain Oxygen Saturation 88% or higher      NuStep   Level 1    SPM 80    Minutes 15    METs 2.1      REL-XR   Level 1    Speed 50    Minutes 15    METs 2.1      Track   Laps 10    Minutes 15    METs 2      Prescription Details   Frequency (times per week) 2    Duration Progress to 30 minutes of continuous aerobic without signs/symptoms of physical distress      Intensity   THRR 40-80% of Max Heartrate 91-131    Ratings of Perceived Exertion 11-13    Perceived Dyspnea 0-4      Progression   Progression Continue to progress workloads to maintain intensity without signs/symptoms of physical distress.      Resistance Training   Training Prescription Yes    Weight 3 lb    Reps 10-15             Perform Capillary Blood Glucose checks as needed.  Exercise Prescription Changes:   Exercise Prescription Changes     Row Name 05/03/23 1500             Response to Exercise   Blood Pressure (Admit) 118/66       Blood Pressure (Exercise) 148/84       Blood Pressure (Exit) 112/66       Heart Rate (Admit) 68 bpm       Heart Rate (Exercise) 106 bpm       Heart Rate (Exit) 80 bpm       Oxygen Saturation (Admit) 93 %       Oxygen  Saturation (Exercise) 86 %       Oxygen Saturation (Exit) 93 %       Rating of Perceived Exertion (Exercise) 14       Perceived Dyspnea (Exercise) 2       Symptoms SOB, chest tightness 3/10       Comments walk test results                Exercise Comments:   Exercise Goals and Review:   Exercise Goals     Row Name 05/03/23 1534             Exercise Goals   Increase Physical Activity Yes       Intervention Provide advice, education,  support and counseling about physical activity/exercise needs.;Develop an individualized exercise prescription for aerobic and resistive training based on initial evaluation findings, risk stratification, comorbidities and participant's personal goals.       Expected Outcomes Short Term: Attend rehab on a regular basis to increase amount of physical activity.;Long Term: Add in home exercise to make exercise part of routine and to increase amount of physical activity.;Long Term: Exercising regularly at least 3-5 days a week.       Increase Strength and Stamina Yes       Intervention Provide advice, education, support and counseling about physical activity/exercise needs.;Develop an individualized exercise prescription for aerobic and resistive training based on initial evaluation findings, risk stratification, comorbidities and participant's personal goals.       Expected Outcomes Short Term: Increase workloads from initial exercise prescription for resistance, speed, and METs.;Short Term: Perform resistance training exercises routinely during rehab and add in resistance training at home;Long Term: Improve cardiorespiratory fitness, muscular endurance and strength as measured by increased METs and functional capacity ( )       Able to understand and use rate of perceived exertion (RPE) scale Yes       Intervention Provide education and explanation on how to use RPE scale       Expected Outcomes Short Term: Able to use RPE daily in rehab to express subjective intensity level;Long Term:  Able to use RPE to guide intensity level when exercising independently       Able to understand and use Dyspnea scale Yes       Intervention Provide education and explanation on how to use Dyspnea scale       Expected Outcomes Short Term: Able to use Dyspnea scale daily in rehab to express subjective sense of shortness of breath during exertion;Long Term: Able to use Dyspnea scale to guide intensity level when exercising  independently       Knowledge and understanding of Target Heart Rate Range (THRR) Yes       Intervention Provide education and explanation of THRR including how the numbers were predicted and where they are located for reference       Expected Outcomes Short Term: Able to state/look up THRR;Long Term: Able to use THRR to govern intensity when exercising independently;Short Term: Able to use daily as guideline for intensity in rehab       Able to check pulse independently Yes       Intervention Review the importance of being able to check your own pulse for safety during independent exercise;Provide education and demonstration on how to check pulse in carotid and radial arteries.       Expected Outcomes Short Term: Able to explain why pulse checking is important during independent exercise;Long Term: Able to check pulse independently  and accurately       Understanding of Exercise Prescription Yes       Intervention Provide education, explanation, and written materials on patient's individual exercise prescription       Expected Outcomes Short Term: Able to explain program exercise prescription;Long Term: Able to explain home exercise prescription to exercise independently                Exercise Goals Re-Evaluation :   Discharge Exercise Prescription (Final Exercise Prescription Changes):  Exercise Prescription Changes - 05/03/23 1500       Response to Exercise   Blood Pressure (Admit) 118/66    Blood Pressure (Exercise) 148/84    Blood Pressure (Exit) 112/66    Heart Rate (Admit) 68 bpm    Heart Rate (Exercise) 106 bpm    Heart Rate (Exit) 80 bpm    Oxygen Saturation (Admit) 93 %    Oxygen Saturation (Exercise) 86 %    Oxygen Saturation (Exit) 93 %    Rating of Perceived Exertion (Exercise) 14    Perceived Dyspnea (Exercise) 2    Symptoms SOB, chest tightness 3/10    Comments walk test results             Nutrition:  Target Goals: Understanding of nutrition guidelines,  daily intake of sodium 1500mg , cholesterol 200mg , calories 30% from fat and 7% or less from saturated fats, daily to have 5 or more servings of fruits and vegetables.  Biometrics:  Pre Biometrics - 05/03/23 1534       Pre Biometrics   Height 5' 6 (1.676 m)    Weight 184 lb 8 oz (83.7 kg)    Waist Circumference 40.5 inches    Hip Circumference 40 inches    Waist to Hip Ratio 1.01 %    BMI (Calculated) 29.79    Grip Strength 32.1 kg    Single Leg Stand 4.4 seconds              Nutrition Therapy Plan and Nutrition Goals:  Nutrition Therapy & Goals - 05/03/23 1535       Intervention Plan   Intervention Prescribe, educate and counsel regarding individualized specific dietary modifications aiming towards targeted core components such as weight, hypertension, lipid management, diabetes, heart failure and other comorbidities.;Nutrition handout(s) given to patient.    Expected Outcomes Short Term Goal: Understand basic principles of dietary content, such as calories, fat, sodium, cholesterol and nutrients.;Long Term Goal: Adherence to prescribed nutrition plan.             Nutrition Assessments:  MEDIFICTS Score Key: >=70 Need to make dietary changes  40-70 Heart Healthy Diet <= 40 Therapeutic Level Cholesterol Diet  Flowsheet Row PULMONARY REHAB COPD ORIENTATION from [facility]03/2023 in [facility] CARDIAC RE[facility]ILITATION  Picture Your Plate Total Score on Admission 59      Picture Your Plate Scores: <59 Unhealthy dietary pattern with much room for improvement. 41-50 Dietary pattern unlikely to meet recommendations for good health and room for improvement. 51-60 More healthful dietary pattern, with some room for improvement.  >60 Healthy dietary pattern, although there may be some specific behaviors that could be improved.    Nutrition Goals Re-Evaluation:   Nutrition Goals Discharge (Final Nutrition Goals Re-Evaluation):   Psychosocial: Target Goals: Acknowledge  presence or absence of significant depression and/or stress, maximize coping skills, provide positive support system. Participant is able to verbalize types and ability to use techniques and skills needed for reducing stress and depression.  Initial Review &  Psychosocial Screening:  Initial Psych Review & Screening - 04/26/23 1340       Initial Review   Current issues with Current Sleep Concerns;Current Psychotropic Meds;History of Depression;Current Depression   PSTD with visual hallucinations     Family Dynamics   Good Support System? Yes    Comments His 2 daughters are his support.      Barriers   Psychosocial barriers to participate in program The patient should benefit from training in stress management and relaxation.      Screening Interventions   Interventions Encouraged to exercise;To provide support and resources with identified psychosocial needs;Provide feedback about the scores to participant    Expected Outcomes Short Term goal: Utilizing psychosocial counselor, staff and physician to assist with identification of specific Stressors or current issues interfering with healing process. Setting desired goal for each stressor or current issue identified.;Long Term Goal: Stressors or current issues are controlled or eliminated.;Short Term goal: Identification and review with participant of any Quality of Life or Depression concerns found by scoring the questionnaire.;Long Term goal: The participant improves quality of Life and PHQ9 Scores as seen by post scores and/or verbalization of changes             Quality of Life Scores:  Scores of 19 and below usually indicate a poorer quality of life in these areas.  A difference of  2-3 points is a clinically meaningful difference.  A difference of 2-3 points in the total score of the Quality of Life Index has been associated with significant improvement in overall quality of life, self-image, physical symptoms, and general health in  studies assessing change in quality of life.   PHQ-9: Review Flowsheet       05/03/2023  Depression screen PHQ 2/9  Decreased Interest 2  Down, Depressed, Hopeless 2  PHQ - 2 Score 4  Altered sleeping 2  Tired, decreased energy 3  Change in appetite 2  Feeling bad or failure about yourself  2  Trouble concentrating 3  Moving slowly or fidgety/restless 2  Suicidal thoughts 0  PHQ-9 Score 18  Difficult doing work/chores Very difficult   Interpretation of Total Score  Total Score Depression Severity:  1-4 = Minimal depression, 5-9 = Mild depression, 10-14 = Moderate depression, 15-19 = Moderately severe depression, 20-27 = Severe depression   Psychosocial Evaluation and Intervention:  Psychosocial Evaluation - 04/26/23 1343       Psychosocial Evaluation & Interventions   Interventions Stress management education;Relaxation education;Encouraged to exercise with the program and follow exercise prescription    Comments Patient was referred to [place] with COPD from the [place]. He served in [place] and does report some depression as well as PTSD with occasionally visual and auditory hallucinations which he says is treated with Buspar. He says his daughter was living with him but she has moved out and he now lives alone which has been a diffucult ajustment for him. He has 2 daughters that do support him with his needs. He says he does attend church which helps him socially. He reports trouble staying asleep mainly due to feelings of choking on sinus drainage. He used to work painting houses and he thinks the fumes from the paint and exposure to chemicals while serving in the [employer] damaged his lungs. He is walking some at [facility] but says he can't do a lot. His goals for the program are to improve his strength and stamina and breathing. He has no barriers identified to completing the program.  Expected Outcomes Short Term: start the program and attend consistently. Long Term: meet his personal  goals.    Continue Psychosocial Services  Follow up required by staff             Psychosocial Re-Evaluation:   Psychosocial Discharge (Final Psychosocial Re-Evaluation):    Education: Education Goals: Education classes will be provided on a weekly basis, covering required topics. Participant will state understanding/return demonstration of topics presented.  Learning Barriers/Preferences:  Learning Barriers/Preferences - 04/26/23 1327       Learning Barriers/Preferences   Learning Barriers None    Learning Preferences Skilled Demonstration;Audio;Written Material             Education Topics: How Lungs Work and Diseases: - Discuss the anatomy of the lungs and diseases that can affect the lungs, such as COPD.   Exercise: -Discuss the importance of exercise, FITT principles of exercise, normal and abnormal responses to exercise, and how to exercise safely.   Environmental Irritants: -Discuss types of environmental irritants and how to limit exposure to environmental irritants.   Meds/Inhalers and oxygen: - Discuss respiratory medications, definition of an inhaler and oxygen, and the proper way to use an inhaler and oxygen.   Energy Saving Techniques: - Discuss methods to conserve energy and decrease shortness of breath when performing activities of daily living.    Bronchial Hygiene / Breathing Techniques: - Discuss breathing mechanics, pursed-lip breathing technique,  proper posture, effective ways to clear airways, and other functional breathing techniques   Cleaning Equipment: - Provides group verbal and written instruction about the health risks of elevated stress, cause of high stress, and healthy ways to reduce stress.   Nutrition I: Fats: - Discuss the types of cholesterol, what cholesterol does to the body, and how cholesterol levels can be controlled.   Nutrition II: Labels: -Discuss the different components of food labels and how to read food  labels.   Respiratory Infections: - Discuss the signs and symptoms of respiratory infections, ways to prevent respiratory infections, and the importance of seeking medical treatment when having a respiratory infection.   Stress I: Signs and Symptoms: - Discuss the causes of stress, how stress may lead to anxiety and depression, and ways to limit stress.   Stress II: Relaxation: -Discuss relaxation techniques to limit stress.   Oxygen for Home/Travel: - Discuss how to prepare for travel when on oxygen and proper ways to transport and store oxygen to ensure safety.   Knowledge Questionnaire Score:  Knowledge Questionnaire Score - 05/03/23 1535       Knowledge Questionnaire Score   Pre Score 11/18             Core Components/Risk Factors/Patient Goals at Admission:  Personal Goals and Risk Factors at Admission - 05/03/23 1535       Core Components/Risk Factors/Patient Goals on Admission    Weight Management Yes;Weight Loss    Intervention Weight Management: Develop a combined nutrition and exercise program designed to reach desired caloric intake, while maintaining appropriate intake of nutrient and fiber, sodium and fats, and appropriate energy expenditure required for the weight goal.;Weight Management: Provide education and appropriate resources to help participant work on and attain dietary goals.;Weight Management/Obesity: Establish reasonable short term and long term weight goals.    Admit Weight 184 lb 8 oz (83.7 kg)    Goal Weight: Short Term 179 lb (81.2 kg)    Goal Weight: Long Term 165 lb (74.8 kg)    Expected Outcomes Short Term: Continue  to assess and modify interventions until short term weight is achieved;Long Term: Adherence to nutrition and physical activity/exercise program aimed toward attainment of established weight goal;Weight Loss: Understanding of general recommendations for a balanced deficit meal plan, which promotes 1-2 lb weight loss per week and  includes a negative energy balance of [phone] kcal/d;Understanding recommendations for meals to include 15-35% energy as protein, 25-35% energy from fat, 35-60% energy from carbohydrates, less than 200mg  of dietary cholesterol, 20-35 gm of total fiber daily;Understanding of distribution of calorie intake throughout the day with the consumption of 4-5 meals/snacks    Improve shortness of breath with ADL's Yes    Expected Outcomes Short Term: Improve cardiorespiratory fitness to achieve a reduction of symptoms when performing ADLs;Long Term: Be able to perform more ADLs without symptoms or delay the onset of symptoms    Increase knowledge of respiratory medications and ability to use respiratory devices properly  Yes    Intervention Provide education and demonstration as needed of appropriate use of medications, inhalers, and oxygen therapy.    Expected Outcomes Short Term: Achieves understanding of medications use. Understands that oxygen is a medication prescribed by physician. Demonstrates appropriate use of inhaler and oxygen therapy.;Long Term: Maintain appropriate use of medications, inhalers, and oxygen therapy.    Intervention Provide education on lifestyle modifcations including regular physical activity/exercise, weight management, moderate sodium restriction and increased consumption of fresh fruit, vegetables, and low fat dairy, alcohol moderation, and smoking cessation.;Monitor prescription use compliance.    Expected Outcomes Short Term: Continued assessment and intervention until BP is < 140/53mm HG in hypertensive participants. < 130/8mm HG in hypertensive participants with diabetes, heart failure or chronic kidney disease.;Long Term: Maintenance of blood pressure at goal levels.    Lipids Yes    Intervention Provide education and support for participant on nutrition & aerobic/resistive exercise along with prescribed medications to achieve LDL 70mg , HDL >40mg .    Expected Outcomes Short  Term: Participant states understanding of desired cholesterol values and is compliant with medications prescribed. Participant is following exercise prescription and nutrition guidelines.;Long Term: Cholesterol controlled with medications as prescribed, with individualized exercise RX and with personalized nutrition plan. Value goals: LDL < 70mg , HDL > 40 mg.             Core Components/Risk Factors/Patient Goals Review:    Core Components/Risk Factors/Patient Goals at Discharge (Final Review):    ITP Comments:  ITP Comments     Row Name 05/03/23 1530 05/04/23 1436         ITP Comments Patient attend orientation today.  Patient is attending Pulmonary Rehabilitation Program.  Documentation for diagnosis can be found in [place] notes in Media for today's appointment.  Reviewed medical chart, RPE/RPD, gym safety, and program guidelines.  Patient was fitted to equipment they will be using during rehab.  Patient is scheduled to start exercise on Tues 05/11/23 at 1330.   Initial ITP created and sent for review and signature by Dr. Anton Kelp, Medical Director for Pulmonary Rehabilitation Program. 30 day review completed. ITP sent to Dr.Jehanzeb Memon, Medical Director of  Pulmonary Rehab. Continue with ITP unless changes are made by physician.  Only attended orientation yesterday.               Comments: 30 day review

## 2023-05-11 ENCOUNTER — Encounter (HOSPITAL_COMMUNITY)
Admission: RE | Admit: 2023-05-11 | Discharge: 2023-05-11 | Disposition: A | Discharge status: Home / Self Care | Payer: No Typology Code available for payment source | Source: Ambulatory Visit

## 2023-05-11 DIAGNOSIS — J449 Chronic obstructive pulmonary disease, unspecified: Secondary | ICD-10-CM | POA: Diagnosis present

## 2023-05-11 NOTE — Progress Notes (Signed)
 Daily Session Note  Patient Details  Name: Chad Hensley MRN: [id number] Date of Birth: 1950/10/17 Referring Provider:   Flowsheet Row PULMONARY REHAB COPD ORIENTATION from 05/03/2023 in [facility] CARDIAC REHABILITATION  Referring Provider [facility]       Encounter Date: 05/11/2023  Check In:  Session Check In - 05/11/23 1330       Check-In   Supervising physician immediately available to respond to emergencies See telemetry face sheet for immediately available MD    Location [facility]-Cardiac & Pulmonary Rehab    Staff Present Tilton Cornea, RN;Jessica Miller, MA, RCEP, CCRP, CCET    Staff Present Danney Daring, BS, RRT, CPFT    Virtual Visit No    Medication changes reported     No    Fall or balance concerns reported    No    Warm-up and Cool-down Performed on first and last piece of equipment    Resistance Training Performed Yes    VAD Patient? No    PAD/SET Patient? No      Pain Assessment   Currently in Pain? No/denies    Multiple Pain Sites No             Capillary Blood Glucose: No results found for this or any previous visit (from the past 24 hours).    Social History   Tobacco Use  Smoking Status Former   Current packs/day: 0.00   Average packs/day: 0.5 packs/day for 2.0 years (1.0 ttl pk-yrs)   Types: Cigarettes   Start date: 04/03/1969   Quit date: 05/04/1970   Years since quitting: 53.0  Smokeless Tobacco Not on file    Goals Met:  Proper associated with RPD/PD & O2 Sat Using PLB without cueing & demonstrates good technique Exercise tolerated well No report of concerns or symptoms today Strength training completed today  Goals Unmet:  Not Applicable  Comments: First full day of exercise!  Patient was oriented to gym and equipment including functions, settings, policies, and procedures.  Patient's individual exercise prescription and treatment plan were reviewed.  All starting workloads were established based on the  results of the 6 minute walk test done at initial orientation visit.  The plan for exercise progression was also introduced and progression will be customized based on patient's performance and goals.

## 2023-05-13 ENCOUNTER — Encounter (HOSPITAL_COMMUNITY)
Admission: RE | Admit: 2023-05-13 | Discharge: 2023-05-13 | Disposition: A | Discharge status: Home / Self Care | Payer: No Typology Code available for payment source | Source: Ambulatory Visit

## 2023-05-13 DIAGNOSIS — J449 Chronic obstructive pulmonary disease, unspecified: Secondary | ICD-10-CM

## 2023-05-13 NOTE — Progress Notes (Signed)
 Daily Session Note  Patient Details  Name: Chad Hensley MRN: [id number] Date of Birth: 02/24/1951 Referring Provider:   Flowsheet Row PULMONARY REHAB COPD ORIENTATION from 05/03/2023 in [facility] CARDIAC REHABILITATION  Referring Provider [facility]       Encounter Date: 05/13/2023  Check In:  Session Check In - 05/13/23 1346       Check-In   Supervising physician immediately available to respond to emergencies See telemetry face sheet for immediately available MD    Location [facility]-Card[facility] & [facility]    Staff Present Powell Benders, BS, Exercise Physiologist;P[place]idon Pam Vonzell, MA, RCEP, CCRP, Sueellen Louder, RN, BSN    Virtual Visit No    Medication changes reported     No    Fall or balance concerns reported    No    Warm-up and Cool-down Performed on first and last piece of equipment    Resistance Training Performed Yes    VAD Patient? No    PAD/SET Patient? No      Pain Assessment   Currently in Pain? No/denies             Capillary Blood Glucose: No results found for this or any previous visit (from the past 24 hours).    Social History   Tobacco Use  Smoking Status Former   Current packs/day: 0.00   Average packs/day: 0.5 packs/day for 2.0 years (1.0 ttl pk-yrs)   Types: Cigarettes   Start date: 04/03/1969   Quit date: 05/04/1970   Years since quitting: 53.0  Smokeless Tobacco Not on file    Goals Met:  Proper associated with RPD/PD & O2 Sat Using PLB without cueing & demonstrates good technique Exercise tolerated well No report of concerns or symptoms today Strength training completed today  Goals Unmet:  Not Applicable  Comments: Pt able to follow exercise prescription today without complaint.  Will continue to monitor for progression.

## 2023-05-18 ENCOUNTER — Encounter (HOSPITAL_COMMUNITY)
Admission: RE | Admit: 2023-05-18 | Discharge: 2023-05-18 | Disposition: A | Discharge status: Home / Self Care | Payer: No Typology Code available for payment source | Source: Ambulatory Visit

## 2023-05-18 DIAGNOSIS — J449 Chronic obstructive pulmonary disease, unspecified: Secondary | ICD-10-CM

## 2023-05-18 NOTE — Progress Notes (Signed)
 Daily Session Note  Patient Details  Name: Chad Hensley MRN: [id number] Date of Birth: 1950/08/14 Referring Provider:   Flowsheet Row PULMONARY REHAB COPD ORIENTATION from 05/03/2023 in [facility] CARDIAC REHABILITATION  Referring Provider [facility]       Encounter Date: 05/18/2023  Check In:  Session Check In - 05/18/23 1326       Check-In   Supervising physician immediately available to respond to emergencies See telemetry face sheet for immediately available MD    Location [facility]    Staff Present Harlene Gelineau, MA, RCEP, CCRP, CCET;Heather Con, [place], Exercise Physiologist;Phyllis Billingsley, RN    Virtual Visit No    Medication changes reported     No    Fall or balance concerns reported    No    Warm-up and Cool-down Performed on first and last piece of equipment    Resistance Training Performed Yes    VAD Patient? No    PAD/SET Patient? No      Pain Assessment   Currently in Pain? No/denies             Capillary Blood Glucose: No results found for this or any previous visit (from the past 24 hours).    Social History   Tobacco Use  Smoking Status Former   Current packs/day: 0.00   Average packs/day: 0.5 packs/day for 2.0 years (1.0 ttl pk-yrs)   Types: Cigarettes   Start date: 04/03/1969   Quit date: 05/04/1970   Years since quitting: 53.0  Smokeless Tobacco Not on file    Goals Met:  Proper associated with RPD/PD & O2 Sat Independence with exercise equipment Using PLB without cueing & demonstrates good technique Exercise tolerated well No report of concerns or symptoms today Strength training completed today  Goals Unmet:  Not Applicable  Comments: Pt able to follow exercise prescription today without complaint.  Will continue to monitor for progression.

## 2023-05-20 ENCOUNTER — Encounter (HOSPITAL_COMMUNITY)
Admission: RE | Admit: 2023-05-20 | Discharge: 2023-05-20 | Disposition: A | Discharge status: Home / Self Care | Payer: No Typology Code available for payment source | Source: Ambulatory Visit

## 2023-05-20 DIAGNOSIS — J449 Chronic obstructive pulmonary disease, unspecified: Secondary | ICD-10-CM | POA: Diagnosis not present

## 2023-05-20 NOTE — Progress Notes (Signed)
Daily Session Note  Patient Details  Name: Chad Hensley MRN: [id number] Date of Birth: November 09, 1950 Referring Provider:   Flowsheet Row PULMONARY REHAB COPD ORIENTATION from 05/03/2023 in [facility] CARDIAC REHABILITATION  Referring Provider [facility]       Encounter Date: 05/20/2023  Check In:  Session Check In - 05/20/23 1333       Check-In   Supervising physician immediately available to respond to emergencies See telemetry face sheet for immediately available MD    Location [facility]    Staff Present Fabio Pierce, MA, RCEP, CCRP, CCET;Heather Fredric Mare, BS, Exercise Physiologist;Debra Laural Benes, RN, BSN    Virtual Visit No    Medication changes reported     No    Fall or balance concerns reported    No    Warm-up and Cool-down Performed on first and last piece of equipment    Resistance Training Performed Yes    VAD Patient? No    PAD/SET Patient? No      Pain Assessment   Currently in Pain? No/denies             Capillary Blood Glucose: No results found for this or any previous visit (from the past 24 hours).    Social History   Tobacco Use  Smoking Status Former   Current packs/day: 0.00   Average packs/day: 0.5 packs/day for 2.0 years (1.0 ttl pk-yrs)   Types: Cigarettes   Start date: 04/03/1969   Quit date: 05/04/1970   Years since quitting: 53.0  Smokeless Tobacco Not on file    Goals Met:  Proper associated with RPD/PD & O2 Sat Independence with exercise equipment Using PLB without cueing & demonstrates good technique Exercise tolerated well No report of concerns or symptoms today Strength training completed today  Goals Unmet:  Not Applicable  Comments: Pt able to follow exercise prescription today without complaint.  Will continue to monitor for progression.

## 2023-05-25 ENCOUNTER — Encounter (HOSPITAL_COMMUNITY)
Admission: RE | Admit: 2023-05-25 | Discharge: 2023-05-25 | Disposition: A | Discharge status: Home / Self Care | Payer: No Typology Code available for payment source | Source: Ambulatory Visit

## 2023-05-25 DIAGNOSIS — J449 Chronic obstructive pulmonary disease, unspecified: Secondary | ICD-10-CM | POA: Diagnosis not present

## 2023-05-25 NOTE — Progress Notes (Signed)
Daily Session Note  Patient Details  Name: Chad Hensley MRN: [id number] Date of Birth: 12/10/1950 Referring Provider:   Flowsheet Row PULMONARY REHAB COPD ORIENTATION from 05/03/2023 in [facility] CARDIAC REHABILITATION  Referring Provider [facility]       Encounter Date: 05/25/2023  Check In:  Session Check In - 05/25/23 1330       Check-In   Supervising physician immediately available to respond to emergencies See telemetry face sheet for immediately available MD    Location [facility]    Staff Present Ross Ludwig, BS, Exercise Physiologist;Brooke Elwyn Reach, RN    Virtual Visit No    Medication changes reported     No    Fall or balance concerns reported    No    Tobacco Cessation No Change    Warm-up and Cool-down Performed on first and last piece of equipment    Resistance Training Performed Yes    VAD Patient? No    PAD/SET Patient? No      Pain Assessment   Currently in Pain? No/denies    Multiple Pain Sites No             Capillary Blood Glucose: No results found for this or any previous visit (from the past 24 hours).    Social History   Tobacco Use  Smoking Status Former   Current packs/day: 0.00   Average packs/day: 0.5 packs/day for 2.0 years (1.0 ttl pk-yrs)   Types: Cigarettes   Start date: 04/03/1969   Quit date: 05/04/1970   Years since quitting: 53.0  Smokeless Tobacco Not on file    Goals Met:  Independence with exercise equipment Using PLB without cueing & demonstrates good technique Exercise tolerated well No report of concerns or symptoms today Strength training completed today  Goals Unmet:  Not Applicable  Comments: Pt able to follow exercise prescription today without complaint.  Will continue to monitor for progression.

## 2023-05-27 ENCOUNTER — Encounter (HOSPITAL_COMMUNITY)
Admission: RE | Admit: 2023-05-27 | Discharge: 2023-05-27 | Disposition: A | Discharge status: Home / Self Care | Payer: No Typology Code available for payment source | Source: Ambulatory Visit

## 2023-05-27 DIAGNOSIS — J449 Chronic obstructive pulmonary disease, unspecified: Secondary | ICD-10-CM

## 2023-05-27 NOTE — Progress Notes (Signed)
Daily Session Note  Patient Details  Name: Chad Hensley MRN: [id number] Date of Birth: 1950-05-30 Referring Provider:   Flowsheet Row PULMONARY REHAB COPD ORIENTATION from 05/03/2023 in [facility] CARDIAC REHABILITATION  Referring Provider Le[facility]       Encounter Date: 05/27/2023  Check In:  Session Check In - 05/27/23 1338       Check-In   Supervising physician immediately available to respond to emergencies See telemetry face sheet for immediately available ER MD    Location [facility]    Staff Present Ross Ludwig, BS, Exercise Physiologist;Brooke Elwyn Reach, Margarite Gouge, RN, BSN    Virtual Visit No    Medication changes reported     No    Fall or balance concerns reported    No    Warm-up and Cool-down Performed on first and last piece of equipment    Resistance Training Performed Yes    VAD Patient? No    PAD/SET Patient? No      Pain Assessment   Currently in Pain? No/denies    Multiple Pain Sites No             Capillary Blood Glucose: No results found for this or any previous visit (from the past 24 hours).    Social History   Tobacco Use  Smoking Status Former   Current packs/day: 0.00   Average packs/day: 0.5 packs/day for 2.0 years (1.0 ttl pk-yrs)   Types: Cigarettes   Start date: 04/03/1969   Quit date: 05/04/1970   Years since quitting: 53.0  Smokeless Tobacco Not on file    Goals Met:  Proper associated with RPD/PD & O2 Sat Independence with exercise equipment Using PLB without cueing & demonstrates good technique Exercise tolerated well No report of concerns or symptoms today Strength training completed today  Goals Unmet:  Not Applicable  Comments: Pt able to follow exercise prescription today without complaint.  Will continue to monitor for progression.

## 2023-06-01 ENCOUNTER — Encounter (HOSPITAL_COMMUNITY)
Admission: RE | Admit: 2023-06-01 | Discharge: 2023-06-01 | Disposition: A | Discharge status: Home / Self Care | Payer: No Typology Code available for payment source | Source: Ambulatory Visit

## 2023-06-01 DIAGNOSIS — J449 Chronic obstructive pulmonary disease, unspecified: Secondary | ICD-10-CM

## 2023-06-02 ENCOUNTER — Encounter (HOSPITAL_COMMUNITY): Payer: Self-pay | Admitting: *Deleted

## 2023-06-02 DIAGNOSIS — J449 Chronic obstructive pulmonary disease, unspecified: Secondary | ICD-10-CM

## 2023-06-02 NOTE — Progress Notes (Signed)
Pulmonary Individual Treatment Plan  Patient Details  Name: Chad Hensley MRN: [id number] Date of Birth: [birth year] Referring Provider:   Flowsheet Row PULMONARY REHAB COPD ORIENTATION from 05/03/2023 in [facility] CARDIAC REHABILITATION  Referring Provider Briones, [facility]]       Initial Encounter Date:  Flowsheet Row PULMONARY REHAB COPD ORIENTATION from 05/03/2023 in [place] CARDIAC REHABILITATION  Date 05/03/23       Visit Diagnosis: Chronic obstructive pulmonary disease, unspecified COPD type (HCC)  Patient's Home Medications on Admission:   Current Outpatient Medications:    acetaminophen (TYLENOL) 500 MG tablet, Take 2 tablets (1,000 mg total) by mouth every 8 (eight) hours as needed for headache., Disp: , Rfl:    ALBUTEROL IN, Inhale into the lungs as needed., Disp: , Rfl:    amLODipine (NORVASC) 5 MG tablet, Take 5 mg by mouth daily. If BP over 130/85, Disp: , Rfl:    ascorbic acid (VITAMIN C) 500 MG tablet, Take 500 mg by mouth daily., Disp: , Rfl:    aspirin EC 81 MG tablet, Take 1 tablet (81 mg total) by mouth daily. Swallow whole., Disp: 30 tablet, Rfl: 12   atorvastatin (LIPITOR) 20 MG tablet, Take 20 mg by mouth daily., Disp: , Rfl:    buPROPion (WELLBUTRIN SR) 150 MG 12 hr tablet, Take 150 mg by mouth 2 (two) times daily., Disp: , Rfl:    carvedilol (COREG) 12.5 MG tablet, Take 1 tablet (12.5 mg total) by mouth 2 (two) times daily with a meal., Disp: 60 tablet, Rfl: 2   cetirizine (ZYRTEC) 10 MG tablet, Take 10 mg by mouth daily., Disp: , Rfl:    Cholecalciferol 2000 UNITS TABS, Take by mouth daily., Disp: , Rfl:    doxycycline (VIBRA-TABS) 100 MG tablet, Take 1 tablet (100 mg total) by mouth every 12 (twelve) hours., Disp: 8 tablet, Rfl: 0   famotidine (PEPCID) 10 MG tablet, Take 2 tablets (20 mg total) by mouth at bedtime., Disp: , Rfl:    fluticasone-salmeterol (WIXELA INHUB) 250-50 MCG/ACT AEPB, Inhale 1 puff into the lungs in the morning and at  bedtime., Disp: , Rfl:    furosemide (LASIX) 20 MG tablet, Take 1 tablet (20 mg total) by mouth daily., Disp: 30 tablet, Rfl: 3   guaiFENesin (MUCINEX) 600 MG 12 hr tablet, Take 1 tablet (600 mg total) by mouth 2 (two) times daily., Disp: 10 tablet, Rfl: 0   hydrOXYzine (ATARAX) 10 MG tablet, Take 10 mg by mouth 3 (three) times daily as needed., Disp: , Rfl:    isosorbide mononitrate (IMDUR) 30 MG 24 hr tablet, Take 1 tablet (30 mg total) by mouth daily., Disp: 30 tablet, Rfl: 2   losartan (COZAAR) 50 MG tablet, Take 1 tablet (50 mg total) by mouth daily., Disp: 30 tablet, Rfl: 2   Omega-3 Fatty Acids (FISH OIL PO), Take 1 capsule by mouth 2 (two) times daily., Disp: , Rfl:    omeprazole (PRILOSEC) 20 MG capsule, Take 20 mg by mouth daily., Disp: , Rfl:    prazosin (MINIPRESS) 2 MG capsule, Take 2 mg by mouth at bedtime., Disp: , Rfl:    predniSONE (DELTASONE) 10 MG tablet, Prednisone 40 mg po daily x 1 day then Prednisone 30 mg po daily x 1 day then Prednisone 20 mg po daily x 1 day then Prednisone 10 mg daily x 1 day then stop..., Disp: 10 tablet, Rfl: 0   Tiotropium Bromide-Olodaterol (STIOLTO RESPIMAT) 2.5-2.5 MCG/ACT AERS, Inhale into the lungs  daily., Disp: , Rfl:    vitamin B-12 (CYANOCOBALAMIN) 50 MCG tablet, Take 50 mcg by mouth daily., Disp: , Rfl:   Past Medical History: Past Medical History:  Diagnosis Date   PTSD (post-traumatic stress disorder) 05/04/1976    Tobacco Use: Social History   Tobacco Use  Smoking Status Former   Current packs/day: 0.00   Average packs/day: 0.5 packs/day for 2.0 years (1.0 ttl pk-yrs)   Types: Cigarettes   Start date: 04/03/1969   Quit date: 05/04/1970   Years since quitting: 53.1  Smokeless Tobacco Not on file    Labs: Review Flowsheet       Latest Ref Rng & Units 05/01/2008 02/15/2022 02/16/2022  Labs for ITP Cardiac and Pulmonary Rehab  Cholestrol 0 - 200 mg/dL - - 474   LDL (calc) 0 - 99 mg/dL - - 73   HDL-C >25 mg/dL - - 33    Trlycerides <956 mg/dL - - 387   Hemoglobin F6E 4.8 - 5.6 % - 5.4  -  TCO2 0 - 100 mmol/L 25  - -    Capillary Blood Glucose: No results found for: "GLUCAP"   Pulmonary Assessment Scores:  Pulmonary Assessment Scores     Row Name 05/03/23 1538         ADL UCSD   ADL Phase Entry     SOB Score total 100     Rest 2     Walk 3     Stairs 5     Bath 4     Dress 3     Shop 3       CAT Score   CAT Score 21       mMRC Score   mMRC Score 3             UCSD: Self-administered rating of dyspnea associated with activities of daily living (ADLs) 6-point scale (0 = "not at all" to 5 = "maximal or unable to do because of breathlessness")  Scoring Scores range from 0 to 120.  Minimally important difference is 5 units  CAT: CAT can identify the health impairment of COPD patients and is better correlated with disease progression.  CAT has a scoring range of zero to 40. The CAT score is classified into four groups of low (less than 10), medium (10 - 20), high (21-30) and very high (31-40) based on the impact level of disease on health status. A CAT score over 10 suggests significant symptoms.  A worsening CAT score could be explained by an exacerbation, poor medication adherence, poor inhaler technique, or progression of COPD or comorbid conditions.  CAT MCID is 2 points  mMRC: mMRC (Modified Medical Research Council) Dyspnea Scale is used to assess the degree of baseline functional disability in patients of respiratory disease due to dyspnea. No minimal important difference is established. A decrease in score of 1 point or greater is considered a positive change.   Pulmonary Function Assessment:  Pulmonary Function Assessment - 05/03/23 1537       Initial Spirometry Results   FVC% 82.5 %    FEV1% 39.4 %    FEV1/FVC Ratio 36    Comments 12/15/2022      Post Bronchodilator Spirometry Results   FVC% 102.5 %    FEV1% 40.2 %    FEV1/FVC Ratio 30    Comments 12/15/22       Breath   Shortness of Breath Yes;Fear of Shortness of Breath;Limiting activity;Panic with Shortness of Breath  Exercise Target Goals: Exercise Program Goal: Individual exercise prescription set using results from initial 6 min walk test and THRR while considering  patient's activity barriers and safety.   Exercise Prescription Goal: Initial exercise prescription builds to 30-45 minutes a day of aerobic activity, 2-3 days per week.  Home exercise guidelines will be given to patient during program as part of exercise prescription that the participant will acknowledge.  Activity Barriers & Risk Stratification:  Activity Barriers & Cardiac Risk Stratification - 04/26/23 1323       Activity Barriers & Cardiac Risk Stratification   Activity Barriers Deconditioning;Shortness of Breath;Joint Problems   Left shoulder joint replaced 3 years ago.   Cardiac Risk Stratification Low             6 Minute Walk:  6 Minute Walk     Row Name 05/03/23 1531         6 Minute Walk   Phase Initial     Distance 940 feet     Walk Time 6 minutes     # of Rest Breaks 0     MPH 1.78     METS 2.15     RPE 14     Perceived Dyspnea  2     VO2 Peak 7.54     Symptoms Yes (comment)     Comments SOB, chest tightness 3/10, carrying small e-cylinder     Resting HR 68 bpm     Resting BP 118/66     Resting Oxygen Saturation  93 %     Exercise Oxygen Saturation  during 6 min walk 86 %     Max Ex. HR 106 bpm     Max Ex. BP 148/84     2 Minute Post BP 122/64       Interval HR   1 Minute HR 92     2 Minute HR 106     3 Minute HR 100     4 Minute HR 98     5 Minute HR 102     6 Minute HR 99     2 Minute Post HR 81     Interval Heart Rate? Yes       Interval Oxygen   Interval Oxygen? Yes     Baseline Oxygen Saturation % 93 %  1L NCC     1 Minute Oxygen Saturation % 90 %     1 Minute Liters of Oxygen 3 L  NCP     2 Minute Oxygen Saturation % 87 %     2 Minute Liters of Oxygen 3 L      3 Minute Oxygen Saturation % 86 %     3 Minute Liters of Oxygen 3 L     4 Minute Oxygen Saturation % 86 %     4 Minute Liters of Oxygen 3 L     5 Minute Oxygen Saturation % 87 %     5 Minute Liters of Oxygen 3 L     6 Minute Oxygen Saturation % 86 %     6 Minute Liters of Oxygen 3 L     2 Minute Post Oxygen Saturation % 94 %     2 Minute Post Liters of Oxygen 3 L              Oxygen Initial Assessment:  Oxygen Initial Assessment - 05/03/23 1536       Home Oxygen   Home Oxygen Device Home Concentrator;Portable Concentrator;E-Tanks  Sleep Oxygen Prescription Continuous    Liters per minute 2    Home Exercise Oxygen Prescription Continuous    Liters per minute 3    Home Resting Oxygen Prescription Continuous    Liters per minute 2    Compliance with Home Oxygen Use Yes      Initial 6 min Walk   Oxygen Used Pulsed;E-Tanks    Liters per minute 3      Program Oxygen Prescription   Program Oxygen Prescription Continuous;E-Tanks    Liters per minute 3      Intervention   Short Term Goals To learn and exhibit compliance with exercise, home and travel O2 prescription;To learn and understand importance of monitoring SPO2 with pulse oximeter and demonstrate accurate use of the pulse oximeter.;To learn and understand importance of maintaining oxygen saturations>88%;To learn and demonstrate proper pursed lip breathing techniques or other breathing techniques. ;To learn and demonstrate proper use of respiratory medications    Long  Term Goals Exhibits compliance with exercise, home  and travel O2 prescription;Verbalizes importance of monitoring SPO2 with pulse oximeter and return demonstration;Maintenance of O2 saturations>88%;Exhibits proper breathing techniques, such as pursed lip breathing or other method taught during program session;Compliance with respiratory medication;Demonstrates proper use of MDI's             Oxygen Re-Evaluation:  Oxygen Re-Evaluation     Row  Name 05/11/23 1516 05/18/23 1345           Program Oxygen Prescription   Program Oxygen Prescription -- Continuous;E-Tanks        Home Oxygen   Home Oxygen Device -- Home Concentrator;Portable Concentrator;E-Tanks      Sleep Oxygen Prescription -- Continuous      Liters per minute -- 2      Home Exercise Oxygen Prescription -- Continuous      Liters per minute -- 2.5      Home Resting Oxygen Prescription -- Continuous      Liters per minute -- 2      Compliance with Home Oxygen Use -- Yes        Goals/Expected Outcomes   Short Term Goals To learn and understand importance of maintaining oxygen saturations>88%;To learn and understand importance of monitoring SPO2 with pulse oximeter and demonstrate accurate use of the pulse oximeter.;To learn and demonstrate proper pursed lip breathing techniques or other breathing techniques.  To learn and understand importance of maintaining oxygen saturations>88%;To learn and understand importance of monitoring SPO2 with pulse oximeter and demonstrate accurate use of the pulse oximeter.;To learn and demonstrate proper pursed lip breathing techniques or other breathing techniques. ;To learn and exhibit compliance with exercise, home and travel O2 prescription;To learn and demonstrate proper use of respiratory medications      Long  Term Goals Maintenance of O2 saturations>88%;Verbalizes importance of monitoring SPO2 with pulse oximeter and return demonstration;Exhibits proper breathing techniques, such as pursed lip breathing or other method taught during program session Maintenance of O2 saturations>88%;Verbalizes importance of monitoring SPO2 with pulse oximeter and return demonstration;Exhibits proper breathing techniques, such as pursed lip breathing or other method taught during program session;Exhibits compliance with exercise, home  and travel O2 prescription;Demonstrates proper use of MDI's;Compliance with respiratory medication      Comments Reviewed  PLB technique with pt.  Talked about how it works and it's importance in maintaining their exercise saturations. Erica is doing well in rehab.  He is good about wearing his oxygen when at home and out and about.  However, he  does take it off to go to mailbox and gets winded quickly.  He was encourage to wear it even then to maintain saturations.  He is doing well on his medications  He is good about using his PLB.      Goals/Expected Outcomes Short: Become more profiecient at using PLB.   Long: Become independent at using PLB. Short: Wear O2 out to mailbox Long; Conitnue to monitor saturations closely.               Oxygen Discharge (Final Oxygen Re-Evaluation):  Oxygen Re-Evaluation - 05/18/23 1345       Program Oxygen Prescription   Program Oxygen Prescription Continuous;E-Tanks      Home Oxygen   Home Oxygen Device Home Concentrator;Portable Concentrator;E-Tanks    Sleep Oxygen Prescription Continuous    Liters per minute 2    Home Exercise Oxygen Prescription Continuous    Liters per minute 2.5    Home Resting Oxygen Prescription Continuous    Liters per minute 2    Compliance with Home Oxygen Use Yes      Goals/Expected Outcomes   Short Term Goals To learn and understand importance of maintaining oxygen saturations>88%;To learn and understand importance of monitoring SPO2 with pulse oximeter and demonstrate accurate use of the pulse oximeter.;To learn and demonstrate proper pursed lip breathing techniques or other breathing techniques. ;To learn and exhibit compliance with exercise, home and travel O2 prescription;To learn and demonstrate proper use of respiratory medications    Long  Term Goals Maintenance of O2 saturations>88%;Verbalizes importance of monitoring SPO2 with pulse oximeter and return demonstration;Exhibits proper breathing techniques, such as pursed lip breathing or other method taught during program session;Exhibits compliance with exercise, home  and travel O2  prescription;Demonstrates proper use of MDI's;Compliance with respiratory medication    Comments Savon is doing well in rehab.  He is good about wearing his oxygen when at home and out and about.  However, he does take it off to go to mailbox and gets winded quickly.  He was encourage to wear it even then to maintain saturations.  He is doing well on his medications  He is good about using his PLB.    Goals/Expected Outcomes Short: Wear O2 out to mailbox Long; Conitnue to monitor saturations closely.             Initial Exercise Prescription:  Initial Exercise Prescription - 05/03/23 1500       Date of Initial Exercise RX and Referring Provider   Date 05/03/23    Referring Provider [facility]     Oxygen   Oxygen Continuous    Liters 3    Maintain Oxygen Saturation 88% or higher      NuStep   Level 1    SPM 80    Minutes 15    METs 2.1      REL-XR   Level 1    Speed 50    Minutes 15    METs 2.1      Track   Laps 10    Minutes 15    METs 2      Prescription Details   Frequency (times per week) 2    Duration Progress to 30 minutes of continuous aerobic without signs/symptoms of physical distress      Intensity   THRR 40-80% of Max Heartrate 91-131    Ratings of Perceived Exertion 11-13    Perceived Dyspnea 0-4      Progression  Progression Continue to progress workloads to maintain intensity without signs/symptoms of physical distress.      Resistance Training   Training Prescription Yes    Weight 3 lb    Reps 10-15             Perform Capillary Blood Glucose checks as needed.  Exercise Prescription Changes:   Exercise Prescription Changes     Row Name 05/03/23 1500 05/27/23 1400           Response to Exercise   Blood Pressure (Admit) 118/66 140/78      Blood Pressure (Exercise) 148/84 152/90      Blood Pressure (Exit) 112/66 140/70      Heart Rate (Admit) 68 bpm 77 bpm      Heart Rate (Exercise) 106 bpm 97 bpm       Heart Rate (Exit) 80 bpm 76 bpm      Oxygen Saturation (Admit) 93 % 93 %      Oxygen Saturation (Exercise) 86 % 95 %      Oxygen Saturation (Exit) 93 % 95 %      Rating of Perceived Exertion (Exercise) 14 13      Perceived Dyspnea (Exercise) 2 3      Symptoms SOB, chest tightness 3/10 --      Comments walk test results --      Duration -- Continue with 30 min of aerobic exercise without signs/symptoms of physical distress.      Intensity -- THRR unchanged        Progression   Progression -- Continue to progress workloads to maintain intensity without signs/symptoms of physical distress.        Resistance Training   Training Prescription -- Yes      Weight -- 3      Reps -- 10-15        Oxygen   Oxygen -- Continuous      Liters -- 3        NuStep   Level -- 3      SPM -- 78      Minutes -- 15      METs -- 1.8        REL-XR   Level -- 3      Speed -- 42      Minutes -- 15      METs -- 2        Oxygen   Maintain Oxygen Saturation -- 88% or higher               Exercise Comments:   Exercise Comments     Row Name 05/11/23 1515           Exercise Comments First full day of exercise!  Patient was oriented to gym and equipment including functions, settings, policies, and procedures.  Patient's individual exercise prescription and treatment plan were reviewed.  All starting workloads were established based on the results of the 6 minute walk test done at initial orientation visit.  The plan for exercise progression was also introduced and progression will be customized based on patient's performance and goals.                Exercise Goals and Review:   Exercise Goals     Row Name 05/03/23 1534             Exercise Goals   Increase Physical Activity Yes       Intervention Provide advice, education, support and counseling about  physical activity/exercise needs.;Develop an individualized exercise prescription for aerobic and resistive training based on  initial evaluation findings, risk stratification, comorbidities and participant's personal goals.       Expected Outcomes Short Term: Attend rehab on a regular basis to increase amount of physical activity.;Long Term: Add in home exercise to make exercise part of routine and to increase amount of physical activity.;Long Term: Exercising regularly at least 3-5 days a week.       Increase Strength and Stamina Yes       Intervention Provide advice, education, support and counseling about physical activity/exercise needs.;Develop an individualized exercise prescription for aerobic and resistive training based on initial evaluation findings, risk stratification, comorbidities and participant's personal goals.       Expected Outcomes Short Term: Increase workloads from initial exercise prescription for resistance, speed, and METs.;Short Term: Perform resistance training exercises routinely during rehab and add in resistance training at home;Long Term: Improve cardiorespiratory fitness, muscular endurance and strength as measured by increased METs and functional capacity ( )       Able to understand and use rate of perceived exertion (RPE) scale Yes       Intervention Provide education and explanation on how to use RPE scale       Expected Outcomes Short Term: Able to use RPE daily in rehab to express subjective intensity level;Long Term:  Able to use RPE to guide intensity level when exercising independently       Able to understand and use Dyspnea scale Yes       Intervention Provide education and explanation on how to use Dyspnea scale       Expected Outcomes Short Term: Able to use Dyspnea scale daily in rehab to express subjective sense of shortness of breath during exertion;Long Term: Able to use Dyspnea scale to guide intensity level when exercising independently       Knowledge and understanding of Target Heart Rate Range (THRR) Yes       Intervention Provide education and explanation of THRR  including how the numbers were predicted and where they are located for reference       Expected Outcomes Short Term: Able to state/look up THRR;Long Term: Able to use THRR to govern intensity when exercising independently;Short Term: Able to use daily as guideline for intensity in rehab       Able to check pulse independently Yes       Intervention Review the importance of being able to check your own pulse for safety during independent exercise;Provide education and demonstration on how to check pulse in carotid and radial arteries.       Expected Outcomes Short Term: Able to explain why pulse checking is important during independent exercise;Long Term: Able to check pulse independently and accurately       Understanding of Exercise Prescription Yes       Intervention Provide education, explanation, and written materials on patient's individual exercise prescription       Expected Outcomes Short Term: Able to explain program exercise prescription;Long Term: Able to explain home exercise prescription to exercise independently                Exercise Goals Re-Evaluation :  Exercise Goals Re-Evaluation     Row Name 05/11/23 1515 05/18/23 1332           Exercise Goal Re-Evaluation   Exercise Goals Review Able to understand and use rate of perceived exertion (RPE) scale;Knowledge and understanding of Target Heart Rate Range (THRR);Understanding  of Exercise Prescription;Able to understand and use Dyspnea scale Increase Physical Activity;Increase Strength and Stamina;Understanding of Exercise Prescription      Comments Reviewed RPE and dyspnea scale, THR and program prescription with pt today.  Pt voiced understanding and was given a copy of goals to take home. Kort is off to a good start in rehab.  He is already starting to feel better.  He finds that he is no longer sitting as much.  He does note some dry mouth and was encouraged to keep drinking water and exercising regularly.  He does have  some weights and has already started to use them at home.  We will review home exercise after a few more sessions.      Expected Outcomes Short: Use RPE daily to regulate intensity.  Long: Follow program prescription in THR. Short: Conitnue to move more Long: Continue to attend rehab regularly.               Discharge Exercise Prescription (Final Exercise Prescription Changes):  Exercise Prescription Changes - 05/27/23 1400       Response to Exercise   Blood Pressure (Admit) 140/78    Blood Pressure (Exercise) 152/90    Blood Pressure (Exit) 140/70    Heart Rate (Admit) 77 bpm    Heart Rate (Exercise) 97 bpm    Heart Rate (Exit) 76 bpm    Oxygen Saturation (Admit) 93 %    Oxygen Saturation (Exercise) 95 %    Oxygen Saturation (Exit) 95 %    Rating of Perceived Exertion (Exercise) 13    Perceived Dyspnea (Exercise) 3    Duration Continue with 30 min of aerobic exercise without signs/symptoms of physical distress.    Intensity THRR unchanged      Progression   Progression Continue to progress workloads to maintain intensity without signs/symptoms of physical distress.      Resistance Training   Training Prescription Yes    Weight 3    Reps 10-15      Oxygen   Oxygen Continuous    Liters 3      NuStep   Level 3    SPM 78    Minutes 15    METs 1.8      REL-XR   Level 3    Speed 42    Minutes 15    METs 2      Oxygen   Maintain Oxygen Saturation 88% or higher             Nutrition:  Target Goals: Understanding of nutrition guidelines, daily intake of sodium 1500mg , cholesterol 200mg , calories 30% from fat and 7% or less from saturated fats, daily to have 5 or more servings of fruits and vegetables.  Biometrics:  Pre Biometrics - 05/03/23 1534       Pre Biometrics   Height 5\' 6"  (1.676 m)    Weight 184 lb 8 oz (83.7 kg)    Waist Circumference 40.5 inches    Hip Circumference 40 inches    Waist to Hip Ratio 1.01 %    BMI (Calculated) 29.79    Grip  Strength 32.1 kg    Single Leg Stand 4.4 seconds              Nutrition Therapy Plan and Nutrition Goals:  Nutrition Therapy & Goals - 05/03/23 1535       Intervention Plan   Intervention Prescribe, educate and counsel regarding individualized specific dietary modifications aiming towards targeted core components such as  weight, hypertension, lipid management, diabetes, heart failure and other comorbidities.;Nutrition handout(s) given to patient.    Expected Outcomes Short Term Goal: Understand basic principles of dietary content, such as calories, fat, sodium, cholesterol and nutrients.;Long Term Goal: Adherence to prescribed nutrition plan.             Nutrition Assessments:  MEDIFICTS Score Key: >=70 Need to make dietary changes  40-70 Heart Healthy Diet <= 40 Therapeutic Level Cholesterol Diet  Flowsheet Row PULMONARY REHAB COPD ORIENTATION from 05/03/2023 in [facility] CARDIAC REHABILITATION  Picture Your Plate Total Score on Admission 59      Picture Your Plate Scores: <16 Unhealthy dietary pattern with much room for improvement. 41-50 Dietary pattern unlikely to meet recommendations for good health and room for improvement. 51-60 More healthful dietary pattern, with some room for improvement.  >60 Healthy dietary pattern, although there may be some specific behaviors that could be improved.    Nutrition Goals Re-Evaluation:  Nutrition Goals Re-Evaluation     Row Name 05/18/23 1339             Goals   Nutrition Goal Healthy Diet       Comment Maximilian is working on his diet.  He tries to stick to it as much as possible.  He aims to stay away from sweets and soda.  He wants to work on losing weight. He is doing well with a variety of fruits and vegetables.  He tries to get good proteins.  He really enjoys baked/broiled chicken with vegetables.  He is not a big breakfast eater and usually gets about two meals a day.       Expected Outcome Short: Conitnue to cut  back on sweets Long: Aim for a variety.                Nutrition Goals Discharge (Final Nutrition Goals Re-Evaluation):  Nutrition Goals Re-Evaluation - 05/18/23 1339       Goals   Nutrition Goal Healthy Diet    Comment Lacharles is working on his diet.  He tries to stick to it as much as possible.  He aims to stay away from sweets and soda.  He wants to work on losing weight. He is doing well with a variety of fruits and vegetables.  He tries to get good proteins.  He really enjoys baked/broiled chicken with vegetables.  He is not a big breakfast eater and usually gets about two meals a day.    Expected Outcome Short: Conitnue to cut back on sweets Long: Aim for a variety.             Psychosocial: Target Goals: Acknowledge presence or absence of significant depression and/or stress, maximize coping skills, provide positive support system. Participant is able to verbalize types and ability to use techniques and skills needed for reducing stress and depression.  Initial Review & Psychosocial Screening:  Initial Psych Review & Screening - 04/26/23 1340       Initial Review   Current issues with Current Sleep Concerns;Current Psychotropic Meds;History of Depression;Current Depression   PSTD with visual hallucinations     Family Dynamics   Good Support System? Yes    Comments His 2 daughters are his support.      Barriers   Psychosocial barriers to participate in program The patient should benefit from training in stress management and relaxation.      Screening Interventions   Interventions Encouraged to exercise;To provide support and resources with identified psychosocial  needs;Provide feedback about the scores to participant    Expected Outcomes Short Term goal: Utilizing psychosocial counselor, staff and physician to assist with identification of specific Stressors or current issues interfering with healing process. Setting desired goal for each stressor or current issue  identified.;Long Term Goal: Stressors or current issues are controlled or eliminated.;Short Term goal: Identification and review with participant of any Quality of Life or Depression concerns found by scoring the questionnaire.;Long Term goal: The participant improves quality of Life and PHQ9 Scores as seen by post scores and/or verbalization of changes             Quality of Life Scores:  Scores of 19 and below usually indicate a poorer quality of life in these areas.  A difference of  2-3 points is a clinically meaningful difference.  A difference of 2-3 points in the total score of the Quality of Life Index has been associated with significant improvement in overall quality of life, self-image, physical symptoms, and general health in studies assessing change in quality of life.   PHQ-9: Review Flowsheet       05/18/2023 05/03/2023  Depression screen PHQ 2/9  Decreased Interest 1 2  Down, Depressed, Hopeless 0 2  PHQ - 2 Score 1 4  Altered sleeping 0 2  Tired, decreased energy 3 3  Change in appetite 0 2  Feeling bad or failure about yourself  0 2  Trouble concentrating 2 3  Moving slowly or fidgety/restless 2 2  Suicidal thoughts 0 0  PHQ-9 Score 8 18  Difficult doing work/chores Somewhat difficult Very difficult   Interpretation of Total Score  Total Score Depression Severity:  1-4 = Minimal depression, 5-9 = Mild depression, 10-14 = Moderate depression, 15-19 = Moderately severe depression, 20-27 = Severe depression   Psychosocial Evaluation and Intervention:  Psychosocial Evaluation - 04/26/23 1343       Psychosocial Evaluation & Interventions   Interventions Stress management education;Relaxation education;Encouraged to exercise with the program and follow exercise prescription    Comments Patient was referred to PR with COPD from the [place]. He served in [place] and does report some depression as well as PTSD with occasionally visual and auditory hallucinations which he  says is treated with Buspar. He says his daughter was living with him but she has moved out and he now lives alone which has been a diffucult ajustment for him. He has 2 daughters that do support him with his needs. He says he does attend church which helps him socially. He reports trouble staying asleep mainly due to feelings of choking on sinus drainage. He used to work painting houses and he thinks the fumes from the paint and exposure to chemicals while serving in the [employer] damaged his lungs. He is walking some at [facility] but says he can't do a lot. His goals for the program are to improve his strength and stamina and breathing. He has no barriers identified to completing the program.    Expected Outcomes Short Term: start the program and attend consistently. Long Term: meet his personal goals.    Continue Psychosocial Services  Follow up required by staff             Psychosocial Re-Evaluation:  Psychosocial Re-Evaluation     Row Name 05/18/23 1334             Psychosocial Re-Evaluation   Current issues with Current Stress Concerns;Current Depression;Current Psychotropic Meds       Comments Lavonne is doing  well in rehab so far.  He is still seeing his counselor at the [place] every month for his PTSD.  He s already feeling better as he is moving more.  He is sleeping well for most part.  Overall, he is feeling better.  His PHQ has improved by 10 pts already, we will continue to check in with him.  He tries not to dwell on things too much and enjoyed stress education last week.       Expected Outcomes Short: Continue to exercise for mental boost Long: conitnue to work with PTSD                Psychosocial Discharge (Final Psychosocial Re-Evaluation):  Psychosocial Re-Evaluation - 05/18/23 1334       Psychosocial Re-Evaluation   Current issues with Current Stress Concerns;Current Depression;Current Psychotropic Meds    Comments Andrzej is doing well in rehab so far.  He is still  seeing his counselor at the [place] every month for his PTSD.  He s already feeling better as he is moving more.  He is sleeping well for most part.  Overall, he is feeling better.  His PHQ has improved by 10 pts already, we will continue to check in with him.  He tries not to dwell on things too much and enjoyed stress education last week.    Expected Outcomes Short: Continue to exercise for mental boost Long: conitnue to work with PTSD              Education: Education Goals: Education classes will be provided on a weekly basis, covering required topics. Participant will state understanding/return demonstration of topics presented.  Learning Barriers/Preferences:  Learning Barriers/Preferences - 04/26/23 1327       Learning Barriers/Preferences   Learning Barriers None    Learning Preferences Skilled Demonstration;Audio;Written Material             Education Topics: How Lungs Work and Diseases: - Discuss the anatomy of the lungs and diseases that can affect the lungs, such as COPD.   Exercise: -Discuss the importance of exercise, FITT principles of exercise, normal and abnormal responses to exercise, and how to exercise safely.   Environmental Irritants: -Discuss types of environmental irritants and how to limit exposure to environmental irritants.   Meds/Inhalers and oxygen: - Discuss respiratory medications, definition of an inhaler and oxygen, and the proper way to use an inhaler and oxygen.   Energy Saving Techniques: - Discuss methods to conserve energy and decrease shortness of breath when performing activities of daily living.    Bronchial Hygiene / Breathing Techniques: - Discuss breathing mechanics, pursed-lip breathing technique,  proper posture, effective ways to clear airways, and other functional breathing techniques   Cleaning Equipment: - Provides group verbal and written instruction about the health risks of elevated stress, cause of high stress, and  healthy ways to reduce stress.   Nutrition I: Fats: - Discuss the types of cholesterol, what cholesterol does to the body, and how cholesterol levels can be controlled.   Nutrition II: Labels: -Discuss the different components of food labels and how to read food labels.   Respiratory Infections: - Discuss the signs and symptoms of respiratory infections, ways to prevent respiratory infections, and the importance of seeking medical treatment when having a respiratory infection.   Stress I: Signs and Symptoms: - Discuss the causes of stress, how stress may lead to anxiety and depression, and ways to limit stress. Flowsheet Row PULMONARY REHAB CHRONIC OBSTRUCTIVE PULMONARY DISEASE  from 05/27/2023 in [facility] CARDIAC REHABILITATION  Date 05/13/23  Educator [facility]  Instruction Review Code 1- Verbalizes Understanding       Stress II: Relaxation: -Discuss relaxation techniques to limit stress.   Oxygen for Home/Travel: - Discuss how to prepare for travel when on oxygen and proper ways to transport and store oxygen to ensure safety.   Knowledge Questionnaire Score:  Knowledge Questionnaire Score - 05/03/23 1535       Knowledge Questionnaire Score   Pre Score 11/18             Core Components/Risk Factors/Patient Goals at Admission:  Personal Goals and Risk Factors at Admission - 05/03/23 1535       Core Components/Risk Factors/Patient Goals on Admission    Weight Management Yes;Weight Loss    Intervention Weight Management: Develop a combined nutrition and exercise program designed to reach desired caloric intake, while maintaining appropriate intake of nutrient and fiber, sodium and fats, and appropriate energy expenditure required for the weight goal.;Weight Management: Provide education and appropriate resources to help participant work on and attain dietary goals.;Weight Management/Obesity: Establish reasonable short term and long term weight goals.    Admit Weight 184  lb 8 oz (83.7 kg)    Goal Weight: Short Term 179 lb (81.2 kg)    Goal Weight: Long Term 165 lb (74.8 kg)    Expected Outcomes Short Term: Continue to assess and modify interventions until short term weight is achieved;Long Term: Adherence to nutrition and physical activity/exercise program aimed toward attainment of established weight goal;Weight Loss: Understanding of general recommendations for a balanced deficit meal plan, which promotes 1-2 lb weight loss per week and includes a negative energy balance of [phone] kcal/d;Understanding recommendations for meals to include 15-35% energy as protein, 25-35% energy from fat, 35-60% energy from carbohydrates, less than 200mg  of dietary cholesterol, 20-35 gm of total fiber daily;Understanding of distribution of calorie intake throughout the day with the consumption of 4-5 meals/snacks    Improve shortness of breath with ADL's Yes    Expected Outcomes Short Term: Improve cardiorespiratory fitness to achieve a reduction of symptoms when performing ADLs;Long Term: Be able to perform more ADLs without symptoms or delay the onset of symptoms    Increase knowledge of respiratory medications and ability to use respiratory devices properly  Yes    Intervention Provide education and demonstration as needed of appropriate use of medications, inhalers, and oxygen therapy.    Expected Outcomes Short Term: Achieves understanding of medications use. Understands that oxygen is a medication prescribed by physician. Demonstrates appropriate use of inhaler and oxygen therapy.;Long Term: Maintain appropriate use of medications, inhalers, and oxygen therapy.    Intervention Provide education on lifestyle modifcations including regular physical activity/exercise, weight management, moderate sodium restriction and increased consumption of fresh fruit, vegetables, and low fat dairy, alcohol moderation, and smoking cessation.;Monitor prescription use compliance.    Expected Outcomes  Short Term: Continued assessment and intervention until BP is < 140/41mm HG in hypertensive participants. < 130/33mm HG in hypertensive participants with diabetes, heart failure or chronic kidney disease.;Long Term: Maintenance of blood pressure at goal levels.    Lipids Yes    Intervention Provide education and support for participant on nutrition & aerobic/resistive exercise along with prescribed medications to achieve LDL 70mg , HDL >40mg .    Expected Outcomes Short Term: Participant states understanding of desired cholesterol values and is compliant with medications prescribed. Participant is following exercise prescription and nutrition guidelines.;Long Term: Cholesterol controlled with medications  as prescribed, with individualized exercise RX and with personalized nutrition plan. Value goals: LDL < 70mg , HDL > 40 mg.             Core Components/Risk Factors/Patient Goals Review:   Goals and Risk Factor Review     Row Name 05/18/23 1341             Core Components/Risk Factors/Patient Goals Review   Personal Goals Review Weight Management/Obesity;Improve shortness of breath with ADL's;Increase knowledge of respiratory medications and ability to use respiratory devices properly.;Hypertension;Lipids       Review Onofre is doing well in rehab.  He wants to lose more weight is working on continuing to improve his diet to help.  His breathing has gotten better as he is not sitting as much anymore.  His blood pressures are doing well and he does check them at home.  He keeps a record to show his doctors regularly.  He was at [facility] yesterday for a general f/u and is doing well.  His breathing is doing well, but has some rougher days.  He is doing well with his meds.  He is good about wearing his oxygen when out and about but not out to [employer].  He was encouraged to wear it regularly to maintain saturations.       Expected Outcomes Short: Continue to work on weight loss.  Long: Continue to montior  risk factors.                Core Components/Risk Factors/Patient Goals at Discharge (Final Review):   Goals and Risk Factor Review - 05/18/23 1341       Core Components/Risk Factors/Patient Goals Review   Personal Goals Review Weight Management/Obesity;Improve shortness of breath with ADL's;Increase knowledge of respiratory medications and ability to use respiratory devices properly.;Hypertension;Lipids    Review Yaiden is doing well in rehab.  He wants to lose more weight is working on continuing to improve his diet to help.  His breathing has gotten better as he is not sitting as much anymore.  His blood pressures are doing well and he does check them at home.  He keeps a record to show his doctors regularly.  He was at [facility] yesterday for a general f/u and is doing well.  His breathing is doing well, but has some rougher days.  He is doing well with his meds.  He is good about wearing his oxygen when out and about but not out to [employer].  He was encouraged to wear it regularly to maintain saturations.    Expected Outcomes Short: Continue to work on weight loss.  Long: Continue to montior risk factors.             ITP Comments:  ITP Comments     Row Name 05/03/23 1530 05/04/23 1436 05/11/23 1515 06/02/23 1138     ITP Comments Patient attend orientation today.  Patient is attending Pulmonary Rehabilitation Program.  Documentation for diagnosis can be found in [place] notes in Media for today's appointment.  Reviewed medical chart, RPE/RPD, gym safety, and program guidelines.  Patient was fitted to equipment they will be using during rehab.  Patient is scheduled to start exercise on Tues 05/11/23 at 1330.   Initial ITP created and sent for review and signature by Dr. Erick Blinks, Medical Director for Pulmonary Rehabilitation Program. 30 day review completed. ITP sent to Dr.Jehanzeb Memon, Medical Director of  Pulmonary Rehab. Continue with ITP unless changes are made by physician.  Only  attended  orientation yesterday. First full day of exercise!  Patient was oriented to gym and equipment including functions, settings, policies, and procedures.  Patient's individual exercise prescription and treatment plan were reviewed.  All starting workloads were established based on the results of the 6 minute walk test done at initial orientation visit.  The plan for exercise progression was also introduced and progression will be customized based on patient's performance and goals. 30 day review completed. ITP sent to Dr.Jehanzeb Memon, Medical Director of  Pulmonary Rehab. Continue with ITP unless changes are made by physician.             Comments: 30 day review

## 2023-06-03 ENCOUNTER — Encounter (HOSPITAL_COMMUNITY)
Admission: RE | Admit: 2023-06-03 | Discharge: 2023-06-03 | Disposition: A | Discharge status: Home / Self Care | Payer: No Typology Code available for payment source | Source: Ambulatory Visit

## 2023-06-03 DIAGNOSIS — J449 Chronic obstructive pulmonary disease, unspecified: Secondary | ICD-10-CM | POA: Diagnosis not present

## 2023-06-03 NOTE — Progress Notes (Signed)
Daily Session Note  Patient Details  Name: Chad Hensley MRN: [id number] Date of Birth: Jun 06, 1950 Referring Provider:   Flowsheet Row PULMONARY REHAB COPD ORIENTATION from 05/03/2023 in [facility] CARDIAC REHABILITATION  Referring Provider [facility]       Encounter Date: 06/03/2023  Check In:  Session Check In - 06/03/23 1330       Check-In   Supervising physician immediately available to respond to emergencies See telemetry face sheet for immediately available MD    Location [facility]    Staff Present Ross Ludwig, BS, Exercise Physiologist;Jessica Juanetta Gosling, MA, RCEP, CCRP, Gennie Alma, RN, BSN    Virtual Visit No    Medication changes reported     No    Fall or balance concerns reported    No    Tobacco Cessation No Change    Warm-up and Cool-down Performed on first and last piece of equipment    Resistance Training Performed Yes    VAD Patient? No    PAD/SET Patient? No      Pain Assessment   Currently in Pain? No/denies    Multiple Pain Sites No             Capillary Blood Glucose: No results found for this or any previous visit (from the past 24 hours).    Social History   Tobacco Use  Smoking Status Former   Current packs/day: 0.00   Average packs/day: 0.5 packs/day for 2.0 years (1.0 ttl pk-yrs)   Types: Cigarettes   Start date: 04/03/1969   Quit date: 05/04/1970   Years since quitting: 53.1  Smokeless Tobacco Not on file    Goals Met:  Independence with exercise equipment Exercise tolerated well No report of concerns or symptoms today Strength training completed today  Goals Unmet:  Not Applicable  Comments: Pt able to follow exercise prescription today without complaint.  Will continue to monitor for progression.

## 2023-06-08 ENCOUNTER — Encounter (HOSPITAL_COMMUNITY)
Admission: RE | Admit: 2023-06-08 | Discharge: 2023-06-08 | Disposition: A | Discharge status: Home / Self Care | Payer: No Typology Code available for payment source | Source: Ambulatory Visit

## 2023-06-08 DIAGNOSIS — J449 Chronic obstructive pulmonary disease, unspecified: Secondary | ICD-10-CM | POA: Diagnosis present

## 2023-06-08 NOTE — Progress Notes (Signed)
Daily Session Note  Patient Details  Name: Chad Hensley MRN: [id number] Date of Birth: 05-Feb-1951 Referring Provider:   Flowsheet Row PULMONARY REHAB COPD ORIENTATION from 05/03/2023 in [facility] CARDIAC REHABILITATION  Referring Provider [facility]       Encounter Date: 06/08/2023  Check In:  Session Check In - 06/08/23 1330       Check-In   Supervising physician immediately available to respond to emergencies See telemetry face sheet for immediately available MD    Location [facility]    Staff Present Fabio Pierce, MA, RCEP, CCRP, CCET;Navy Rothschild, RN    Virtual Visit No    Medication changes reported     No    Fall or balance concerns reported    No    Warm-up and Cool-down Performed on first and last piece of equipment    Resistance Training Performed Yes    VAD Patient? No    PAD/SET Patient? No      Pain Assessment   Currently in Pain? No/denies    Multiple Pain Sites No             Capillary Blood Glucose: No results found for this or any previous visit (from the past 24 hours).    Social History   Tobacco Use  Smoking Status Former   Current packs/day: 0.00   Average packs/day: 0.5 packs/day for 2.0 years (1.0 ttl pk-yrs)   Types: Cigarettes   Start date: 04/03/1969   Quit date: 05/04/1970   Years since quitting: 53.1  Smokeless Tobacco Not on file    Goals Met:  Proper associated with RPD/PD & O2 Sat Independence with exercise equipment Improved SOB with ADL's Using PLB without cueing & demonstrates good technique Exercise tolerated well No report of concerns or symptoms today Strength training completed today  Goals Unmet:  Not Applicable  Comments: Pt able to follow exercise prescription today without complaint.  Will continue to monitor for progression.

## 2023-06-10 ENCOUNTER — Ambulatory Visit (HOSPITAL_COMMUNITY): Payer: No Typology Code available for payment source

## 2023-06-15 ENCOUNTER — Encounter (HOSPITAL_COMMUNITY)
Admission: RE | Admit: 2023-06-15 | Discharge: 2023-06-15 | Disposition: A | Discharge status: Home / Self Care | Payer: No Typology Code available for payment source | Source: Ambulatory Visit

## 2023-06-15 DIAGNOSIS — J449 Chronic obstructive pulmonary disease, unspecified: Secondary | ICD-10-CM

## 2023-06-15 NOTE — Progress Notes (Signed)
Daily Session Note  Patient Details  Name: Chad Hensley MRN: [id number] Date of Birth: 09-15-1950 Referring Provider:   Flowsheet Row PULMONARY REHAB COPD ORIENTATION from 05/03/2023 in [facility] CARDIAC REHABILITATION  Referring Provider [facility]]       Encounter Date: 06/15/2023  Check In:  Session Check In - 06/15/23 1330       Check-In   Supervising physician immediately available to respond to emergencies See telemetry face sheet for immediately available MD    Location [facility]    Staff Present Ross Ludwig, BS, Exercise Physiologist;Phyllis Billingsley, RN    Virtual Visit No    Medication changes reported     No    Fall or balance concerns reported    No    Tobacco Cessation No Change    Warm-up and Cool-down Performed on first and last piece of equipment    Resistance Training Performed Yes    VAD Patient? No    PAD/SET Patient? No      Pain Assessment   Currently in Pain? No/denies    Multiple Pain Sites No             Capillary Blood Glucose: No results found for this or any previous visit (from the past 24 hours).    Social History   Tobacco Use  Smoking Status Former   Current packs/day: 0.00   Average packs/day: 0.5 packs/day for 2.0 years (1.0 ttl pk-yrs)   Types: Cigarettes   Start date: 04/03/1969   Quit date: 05/04/1970   Years since quitting: 53.1  Smokeless Tobacco Not on file    Goals Met:  Independence with exercise equipment Using PLB without cueing & demonstrates good technique Exercise tolerated well No report of concerns or symptoms today Strength training completed today  Goals Unmet:  Not Applicable  Comments: Pt able to follow exercise prescription today without complaint.  Will continue to monitor for progression.

## 2023-06-16 ENCOUNTER — Other Ambulatory Visit: Payer: Self-pay

## 2023-06-16 ENCOUNTER — Emergency Department (HOSPITAL_COMMUNITY)
Admission: EM | Admit: 2023-06-16 | Discharge: 2023-06-16 | Disposition: A | Discharge status: Home / Self Care | Payer: No Typology Code available for payment source | Attending: Emergency Medicine | Admitting: Emergency Medicine

## 2023-06-16 ENCOUNTER — Emergency Department (HOSPITAL_COMMUNITY): Payer: No Typology Code available for payment source

## 2023-06-16 ENCOUNTER — Encounter (HOSPITAL_COMMUNITY): Payer: Self-pay

## 2023-06-16 DIAGNOSIS — Z7951 Long term (current) use of inhaled steroids: Secondary | ICD-10-CM | POA: Insufficient documentation

## 2023-06-16 DIAGNOSIS — Z20822 Contact with and (suspected) exposure to covid-19: Secondary | ICD-10-CM | POA: Diagnosis not present

## 2023-06-16 DIAGNOSIS — J45909 Unspecified asthma, uncomplicated: Secondary | ICD-10-CM | POA: Diagnosis not present

## 2023-06-16 DIAGNOSIS — R0602 Shortness of breath: Secondary | ICD-10-CM | POA: Diagnosis not present

## 2023-06-16 DIAGNOSIS — I10 Essential (primary) hypertension: Secondary | ICD-10-CM | POA: Insufficient documentation

## 2023-06-16 DIAGNOSIS — Z79899 Other long term (current) drug therapy: Secondary | ICD-10-CM | POA: Diagnosis not present

## 2023-06-16 DIAGNOSIS — Z7982 Long term (current) use of aspirin: Secondary | ICD-10-CM | POA: Diagnosis not present

## 2023-06-16 HISTORY — DX: Essential (primary) hypertension: I10

## 2023-06-16 HISTORY — DX: Chronic obstructive pulmonary disease, unspecified: J44.9

## 2023-06-16 LAB — CBC WITH DIFFERENTIAL/PLATELET
Abs Immature Granulocytes: 0.04 10*3/uL (ref 0.00–0.07)
Basophils Absolute: 0.1 10*3/uL (ref 0.0–0.1)
Basophils Relative: 1 %
Eosinophils Absolute: 0.4 10*3/uL (ref 0.0–0.5)
Eosinophils Relative: 3 %
HCT: 47 % (ref 39.0–52.0)
Hemoglobin: 15.3 g/dL (ref 13.0–17.0)
Immature Granulocytes: 0 %
Lymphocytes Relative: 15 %
Lymphs Abs: 1.5 10*3/uL (ref 0.7–4.0)
MCH: 29.3 pg (ref 26.0–34.0)
MCHC: 32.6 g/dL (ref 30.0–36.0)
MCV: 90 fL (ref 80.0–100.0)
Monocytes Absolute: 0.8 10*3/uL (ref 0.1–1.0)
Monocytes Relative: 8 %
Neutro Abs: 7.4 10*3/uL (ref 1.7–7.7)
Neutrophils Relative %: 73 %
Platelets: 273 10*3/uL (ref 150–400)
RBC: 5.22 MIL/uL (ref 4.22–5.81)
RDW: 13.2 % (ref 11.5–15.5)
WBC: 10.2 10*3/uL (ref 4.0–10.5)
nRBC: 0 % (ref 0.0–0.2)

## 2023-06-16 LAB — BASIC METABOLIC PANEL
Anion gap: 12 (ref 5–15)
BUN: 16 mg/dL (ref 8–23)
CO2: 27 mmol/L (ref 22–32)
Calcium: 10.1 mg/dL (ref 8.9–10.3)
Chloride: 104 mmol/L (ref 98–111)
Creatinine, Ser: 1.02 mg/dL (ref 0.61–1.24)
GFR, Estimated: >60 mL/min (ref >60)
Glucose, Bld: 132 mg/dL — ABNORMAL HIGH (ref 70–99)
Potassium: 4.4 mmol/L (ref 3.5–5.1)
Sodium: 143 mmol/L (ref 135–145)

## 2023-06-16 LAB — RESP PANEL BY RT-PCR (RSV, FLU A&B, COVID)  RVPGX2
Influenza A by PCR: NEGATIVE
Influenza B by PCR: NEGATIVE
Resp Syncytial Virus by PCR: NEGATIVE
SARS Coronavirus 2 by RT PCR: NEGATIVE

## 2023-06-16 MED ORDER — LOSARTAN POTASSIUM 25 MG PO TABS
50.0000 mg | ORAL_TABLET | Freq: Once | ORAL | Status: AC
  Administered 2023-06-16: 50 mg via ORAL
  Filled 2023-06-16: qty 2

## 2023-06-16 MED ORDER — LOSARTAN POTASSIUM 50 MG PO TABS: 50.0000 mg | ORAL_TABLET | Freq: Every day | ORAL | 2 refills | Status: DC

## 2023-06-16 NOTE — Discharge Instructions (Signed)
Test results today are reassuring.  A prescription for losartan was sent to your pharmacy.  This is a blood pressure medication that you were previously on.  Take this daily and continue to monitor your blood pressures at home.  Discuss any further medication changes needed with your primary care doctor.  Return to the emergency department for any new symptoms of concern.

## 2023-06-16 NOTE — ED Triage Notes (Signed)
Pt arrived via [facility] from home c/o hypertension. Pt reports his BP was 190/112 at home. [facility] report Pts BP was 194/98. Pt endorses SOB with exertion, otherwise presents in NAD. Pt on 2L Nasal Cannula at baseline.

## 2023-06-16 NOTE — ED Provider Notes (Signed)
[facility] Provider Note   CSN: [id number] Arrival date & time: 06/16/23  1721     History  Chief Complaint  Patient presents with   Hypertension    Chad Hensley is a 73 y.o. male.   Hypertension Pertinent negatives include no chest pain and no headaches.  Patient presents for hypertension.  Medical history includes HTN, asthma, GERD, migraine headaches.  In the past, he has been prescribed amlodipine, Coreg, Lasix, Imdur, losartan.  He was taken off multiple medications.  Currently, he is on Coreg and Lasix only.  He states that medication changes occurred 4 months ago.  He has been tracking his blood pressures at home.  He states that they were in a normal range up until the past week.  Over the past week, they have been persistently elevated.  Earlier today, but pressures at home are in the range of 190s over 100.  He denies any new symptoms.  He does have exertional shortness of breath but reports that this is baseline for him.  He is on 2 L of supplemental oxygen throughout the day and night.  Coreg is taken at midday and at night.     Home Medications Prior to Admission medications   Medication Sig Start Date End Date Taking? Authorizing Provider  acetaminophen (TYLENOL) 500 MG tablet Take 2 tablets (1,000 mg total) by mouth every 8 (eight) hours as needed for headache. 02/17/22   Vassie Loll, MD  ALBUTEROL IN Inhale into the lungs as needed.    [provider]  amLODipine (NORVASC) 5 MG tablet Take 5 mg by mouth daily. If BP over 130/85    [provider]  ascorbic acid (VITAMIN C) 500 MG tablet Take 500 mg by mouth daily.    [provider]  aspirin EC 81 MG tablet Take 1 tablet (81 mg total) by mouth daily. Swallow whole. 02/18/22   Vassie Loll, MD  atorvastatin (LIPITOR) 20 MG tablet Take 20 mg by mouth daily.    [provider]  buPROPion (WELLBUTRIN SR) 150 MG 12 hr tablet Take 150 mg  by mouth 2 (two) times daily.    [provider]  carvedilol (COREG) 12.5 MG tablet Take 1 tablet (12.5 mg total) by mouth 2 (two) times daily with a meal. 02/17/22   Vassie Loll, MD  cetirizine (ZYRTEC) 10 MG tablet Take 10 mg by mouth daily.    [provider]  Cholecalciferol 2000 UNITS TABS Take by mouth daily.    [provider]  doxycycline (VIBRA-TABS) 100 MG tablet Take 1 tablet (100 mg total) by mouth every 12 (twelve) hours. 08/22/22   Meredeth Ide, MD  famotidine (PEPCID) 10 MG tablet Take 2 tablets (20 mg total) by mouth at bedtime. 02/17/22   Vassie Loll, MD  fluticasone-salmeterol [facility] INHUB) 250-50 MCG/ACT AEPB Inhale 1 puff into the lungs in the morning and at bedtime.    [provider]  furosemide (LASIX) 20 MG tablet Take 1 tablet (20 mg total) by mouth daily. 08/22/22 12/20/22  Meredeth Ide, MD  guaiFENesin (MUCINEX) 600 MG 12 hr tablet Take 1 tablet (600 mg total) by mouth 2 (two) times daily. 08/22/22   Meredeth Ide, MD  hydrOXYzine (ATARAX) 10 MG tablet Take 10 mg by mouth 3 (three) times daily as needed.    [provider]  isosorbide mononitrate (IMDUR) 30 MG 24 hr tablet Take 1 tablet (30 mg total) by mouth  daily. 02/18/22   Vassie Loll, MD  losartan (COZAAR) 50 MG tablet Take 1 tablet (50 mg total) by mouth daily. 06/16/23   Gloris Manchester, MD  Omega-3 Fatty Acids (FISH OIL PO) Take 1 capsule by mouth 2 (two) times daily.    [provider]  omeprazole (PRILOSEC) 20 MG capsule Take 20 mg by mouth daily.    [provider]  prazosin (MINIPRESS) 2 MG capsule Take 2 mg by mouth at bedtime.    [provider]  predniSONE (DELTASONE) 10 MG tablet Prednisone 40 mg po daily x 1 day then Prednisone 30 mg po daily x 1 day then Prednisone 20 mg po daily x 1 day then Prednisone 10 mg daily x 1 day then stop... 08/22/22   Meredeth Ide, MD  Tiotropium Bromide-Olodaterol (STIOLTO RESPIMAT) 2.5-2.5 MCG/ACT AERS  Inhale into the lungs daily.    [provider]  vitamin B-12 (CYANOCOBALAMIN) 50 MCG tablet Take 50 mcg by mouth daily.    [provider]      Allergies    Antihistamines, diphenhydramine-type and Codeine    Review of Systems   Review of Systems  Cardiovascular:  Negative for chest pain.  Neurological:  Negative for dizziness, numbness and headaches.  All other systems reviewed and are negative.   Physical Exam Updated Vital Signs BP (!) 172/99   Pulse 88   Temp 98.3 F (36.8 C)   Resp 14   Ht 5\' 6"  (1.676 m)   Wt 83.7 kg   SpO2 93%   BMI 29.78 kg/m  Physical Exam Vitals and nursing note reviewed.  Constitutional:      General: He is not in acute distress.    Appearance: Normal appearance. He is well-developed. He is not ill-appearing, toxic-appearing or diaphoretic.  HENT:     Head: Normocephalic and atraumatic.     Right Ear: External ear normal.     Left Ear: External ear normal.     Nose: Nose normal.     Mouth/Throat:     Mouth: Mucous membranes are moist.  Eyes:     Extraocular Movements: Extraocular movements intact.     Conjunctiva/sclera: Conjunctivae normal.  Cardiovascular:     Rate and Rhythm: Normal rate and regular rhythm.  Pulmonary:     Effort: Pulmonary effort is normal. No respiratory distress.  Abdominal:     General: There is no distension.     Palpations: Abdomen is soft.  Musculoskeletal:        General: No swelling.     Cervical back: Normal range of motion and neck supple.  Skin:    General: Skin is warm and dry.  Neurological:     General: No focal deficit present.     Mental Status: He is alert and oriented to person, place, and time.     Cranial Nerves: No cranial nerve deficit.     Sensory: No sensory deficit.     Motor: No weakness.     Coordination: Coordination normal.  Psychiatric:        Mood and Affect: Mood normal.        Behavior: Behavior normal.     ED Results / Procedures / Treatments    Labs (all labs ordered are listed, but only abnormal results are displayed) Labs Reviewed  BASIC METABOLIC PANEL - Abnormal; Notable for the following components:      Result Value   Glucose, Bld 132 (*)    All other components within normal limits  RESP PANEL BY RT-PCR (RSV, FLU A&B, COVID)  [id number]  CBC WITH DIFFERENTIAL/PLATELET    EKG None  Radiology DG Chest 2 View Result Date: 06/16/2023 CLINICAL DATA:  Shortness of breath. EXAM: CHEST - 2 VIEW COMPARISON:  08/19/2022. FINDINGS: The heart size and mediastinal contours are within normal limits. There is a moderate hiatal hernia. Atherosclerotic calcification of the aorta is noted. There is hyperinflation of the lungs with flattening of the diaphragms. Minimal atelectasis or scarring is present at the left lung base. No consolidation, effusion, or pneumothorax is seen. Degenerative changes are noted in the thoracic spine. No acute osseous abnormality. IMPRESSION: 1. No active cardiopulmonary disease. 2. Chronic obstructive pulmonary disease. 3. Moderate hiatal hernia. Electronically Signed   By: Thornell Sartorius M.D.   On: 06/16/2023 19:24    Procedures Procedures    Medications Ordered in ED Medications  losartan (COZAAR) tablet 50 mg (has no administration in time range)    ED Course/ Medical Decision Making/ A&P                                 Medical Decision Making Amount and/or Complexity of Data Reviewed Labs: ordered. Radiology: ordered.  Risk Prescription drug management.   Patient presents for asymptomatic hypertension.  On arrival in the ED, blood pressures in the range of 170/100.  On assessment, patient is well-appearing.  Blood pressure now in the range of 160s over 90s.  Lab work shows normal kidney function, normal electrolytes, normal hemoglobin, no leukocytosis.  Chest x-ray shows no acute findings.  EKG shows normal sinus rhythm without any concerning ST segment or T wave abnormalities.  Patient has no  focal neurologic deficits.  He admits that he is simply anxious anytime his blood pressure numbers get high at home.  I reviewed his home medications with him.  Although he was prescribed multiple blood pressure medications in the past, it seems that he is only on a 12.5 mg dose of Coreg twice a day and a daily 20 mg Lasix.  Plan will be to reinitiate one of his previous blood pressure medications.  Dose of losartan was given in the ED.  He was prescribed this to continue at home.  He was advised to continue to monitor his blood pressures and follow-up with his PCP for any further medication changes needed.  Patient was discharged in stable condition.        Final Clinical Impression(s) / ED Diagnoses Final diagnoses:  Hypertension, unspecified type    Rx / DC Orders ED Discharge Orders          Ordered    losartan (COZAAR) 50 MG tablet  Daily        06/16/23 2157              Gloris Manchester, MD 06/16/23 2211

## 2023-06-17 ENCOUNTER — Ambulatory Visit (HOSPITAL_COMMUNITY): Payer: No Typology Code available for payment source

## 2023-06-22 ENCOUNTER — Encounter (HOSPITAL_COMMUNITY)
Admission: RE | Admit: 2023-06-22 | Discharge: 2023-06-22 | Disposition: A | Discharge status: Home / Self Care | Payer: No Typology Code available for payment source | Source: Ambulatory Visit

## 2023-06-22 DIAGNOSIS — J449 Chronic obstructive pulmonary disease, unspecified: Secondary | ICD-10-CM | POA: Diagnosis not present

## 2023-06-22 NOTE — Progress Notes (Signed)
 Daily Session Note  Patient Details  Name: Chad Hensley MRN: [id number] Date of Birth: 02-14-1951 Referring Provider:   Flowsheet Row PULMONARY REHAB COPD ORIENTATION from 05/03/2023 in [facility] CARDIAC RE[facility]ABILITATION  Referring Provider Leana [facility]]       Encounter Date: 06/22/2023  Check In:  Session Check In - 06/22/23 1330       Check-In   Supervising physician immediately available to respond to emergencies See telemetry face sheet for immediately available MD    Location [facility]    Staff Present Ross Ludwig, BS, Exercise Physiologist;Jessica Juanetta Gosling, MA, RCEP, CCRP, CCET;Christan Ciccarelli, RN;Brittany Foley, BSN, RN    Virtual Visit No    Medication changes reported     No    Fall or balance concerns reported    No    Warm-up and Cool-down Performed on first and last piece of equipment    Resistance Training Performed Yes    VAD Patient? No    PAD/SET Patient? No      Pain Assessment   Currently in Pain? No/denies    Pain Score 0-No pain    Multiple Pain Sites No             Capillary Blood Glucose: No results found for this or any previous visit (from the past 24 hours).    Social History   Tobacco Use  Smoking Status Former   Current packs/day: 0.00   Average packs/day: 0.5 packs/day for 2.0 years (1.0 ttl pk-yrs)   Types: Cigarettes   Start date: 04/03/1969   Quit date: 05/04/1970   Years since quitting: 53.1  Smokeless Tobacco Not on file    Goals Met:  Proper associated with RPD/PD & O2 Sat Independence with exercise equipment Using PLB without cueing & demonstrates good technique Exercise tolerated well No report of concerns or symptoms today Strength training completed today  Goals Unmet:  Not Applicable  Comments: Pt able to follow exercise prescription today without complaint.  Will continue to monitor for progression.

## 2023-06-24 ENCOUNTER — Encounter (HOSPITAL_COMMUNITY): Payer: No Typology Code available for payment source

## 2023-06-29 ENCOUNTER — Ambulatory Visit (HOSPITAL_COMMUNITY): Payer: No Typology Code available for payment source

## 2023-06-30 ENCOUNTER — Encounter (HOSPITAL_COMMUNITY): Payer: Self-pay | Admitting: *Deleted

## 2023-06-30 DIAGNOSIS — J449 Chronic obstructive pulmonary disease, unspecified: Secondary | ICD-10-CM

## 2023-06-30 NOTE — Progress Notes (Signed)
 Pulmonary Individual Treatment Plan  Patient Details  Name: Chad Hensley MRN: [id number] Date of Birth: 03-02-1951 Referring Provider:   Flowsheet Row PULMONARY REHAB COPD ORIENTATION from 05/03/2023 in [facility] CARDIAC REHABILITATION  Referring Provider Briones, [facility]]       Initial Encounter Date:  Flowsheet Row PULMONARY REHAB COPD ORIENTATION from 05/03/2023 in [facility] CARDIAC REHABILITATION  Date 05/03/23       Visit Diagnosis: Chronic obstructive pulmonary disease, unspecified COPD type (HCC)  Patient's Home Medications on Admission:   Current Outpatient Medications:    acetaminophen (TYLENOL) 500 MG tablet, Take 2 tablets (1,000 mg total) by mouth every 8 (eight) hours as needed for headache., Disp: , Rfl:    ALBUTEROL IN, Inhale into the lungs as needed., Disp: , Rfl:    amLODipine (NORVASC) 5 MG tablet, Take 5 mg by mouth daily. If BP over 130/85, Disp: , Rfl:    ascorbic acid (VITAMIN C) 500 MG tablet, Take 500 mg by mouth daily., Disp: , Rfl:    aspirin EC 81 MG tablet, Take 1 tablet (81 mg total) by mouth daily. Swallow whole., Disp: 30 tablet, Rfl: 12   atorvastatin (LIPITOR) 20 MG tablet, Take 20 mg by mouth daily., Disp: , Rfl:    buPROPion (WELLBUTRIN SR) 150 MG 12 hr tablet, Take 150 mg by mouth 2 (two) times daily., Disp: , Rfl:    carvedilol (COREG) 12.5 MG tablet, Take 1 tablet (12.5 mg total) by mouth 2 (two) times daily with a meal., Disp: 60 tablet, Rfl: 2   cetirizine (ZYRTEC) 10 MG tablet, Take 10 mg by mouth daily., Disp: , Rfl:    Cholecalciferol 2000 UNITS TABS, Take by mouth daily., Disp: , Rfl:    doxycycline (VIBRA-TABS) 100 MG tablet, Take 1 tablet (100 mg total) by mouth every 12 (twelve) hours., Disp: 8 tablet, Rfl: 0   famotidine (PEPCID) 10 MG tablet, Take 2 tablets (20 mg total) by mouth at bedtime., Disp: , Rfl:    fluticasone-salmeterol (WIXELA INHUB) 250-50 MCG/ACT AEPB, Inhale 1 puff into the lungs in the morning and at  bedtime., Disp: , Rfl:    furosemide (LASIX) 20 MG tablet, Take 1 tablet (20 mg total) by mouth daily., Disp: 30 tablet, Rfl: 3   guaiFENesin (MUCINEX) 600 MG 12 hr tablet, Take 1 tablet (600 mg total) by mouth 2 (two) times daily., Disp: 10 tablet, Rfl: 0   hydrOXYzine (ATARAX) 10 MG tablet, Take 10 mg by mouth 3 (three) times daily as needed., Disp: , Rfl:    isosorbide mononitrate (IMDUR) 30 MG 24 hr tablet, Take 1 tablet (30 mg total) by mouth daily., Disp: 30 tablet, Rfl: 2   losartan (COZAAR) 50 MG tablet, Take 1 tablet (50 mg total) by mouth daily., Disp: 30 tablet, Rfl: 2   Omega-3 Fatty Acids (FISH OIL PO), Take 1 capsule by mouth 2 (two) times daily., Disp: , Rfl:    omeprazole (PRILOSEC) 20 MG capsule, Take 20 mg by mouth daily., Disp: , Rfl:    prazosin (MINIPRESS) 2 MG capsule, Take 2 mg by mouth at bedtime., Disp: , Rfl:    predniSONE (DELTASONE) 10 MG tablet, Prednisone 40 mg po daily x 1 day then Prednisone 30 mg po daily x 1 day then Prednisone 20 mg po daily x 1 day then Prednisone 10 mg daily x 1 day then stop..., Disp: 10 tablet, Rfl: 0   Tiotropium Bromide-Olodaterol (STIOLTO RESPIMAT) 2.5-2.5 MCG/ACT AERS, Inhale into the lungs  daily., Disp: , Rfl:    vitamin B-12 (CYANOCOBALAMIN) 50 MCG tablet, Take 50 mcg by mouth daily., Disp: , Rfl:   Past Medical History: Past Medical History:  Diagnosis Date   COPD (chronic obstructive pulmonary disease) (HCC)    Hypertension    PTSD (post-traumatic stress disorder) 05/04/1976    Tobacco Use: Social History   Tobacco Use  Smoking Status Former   Current packs/day: 0.00   Average packs/day: 0.5 packs/day for 2.0 years (1.0 ttl pk-yrs)   Types: Cigarettes   Start date: 04/03/1969   Quit date: 05/04/1970   Years since quitting: 53.1  Smokeless Tobacco Not on file    Labs: Review Flowsheet       Latest Ref Rng & Units 05/01/2008 02/15/2022 02/16/2022  Labs for ITP Cardiac and Pulmonary Rehab  Cholestrol 0 - 200 mg/dL - -  102   LDL (calc) 0 - 99 mg/dL - - 73   HDL-C >72 mg/dL - - 33   Trlycerides <536 mg/dL - - 644   Hemoglobin I3K 4.8 - 5.6 % - 5.4  -  TCO2 0 - 100 mmol/L 25  - -    Capillary Blood Glucose: No results found for: "GLUCAP"   Pulmonary Assessment Scores:  Pulmonary Assessment Scores     Row Name 05/03/23 1538         ADL UCSD   ADL Phase Entry     SOB Score total 100     Rest 2     Walk 3     Stairs 5     Bath 4     Dress 3     Shop 3       CAT Score   CAT Score 21       mMRC Score   mMRC Score 3             UCSD: Self-administered rating of dyspnea associated with activities of daily living (ADLs) 6-point scale (0 = "not at all" to 5 = "maximal or unable to do because of breathlessness")  Scoring Scores range from 0 to 120.  Minimally important difference is 5 units  CAT: CAT can identify the health impairment of COPD patients and is better correlated with disease progression.  CAT has a scoring range of zero to 40. The CAT score is classified into four groups of low (less than 10), medium (10 - 20), high (21-30) and very high (31-40) based on the impact level of disease on health status. A CAT score over 10 suggests significant symptoms.  A worsening CAT score could be explained by an exacerbation, poor medication adherence, poor inhaler technique, or progression of COPD or comorbid conditions.  CAT MCID is 2 points  mMRC: mMRC (Modified Medical Research Council) Dyspnea Scale is used to assess the degree of baseline functional disability in patients of respiratory disease due to dyspnea. No minimal important difference is established. A decrease in score of 1 point or greater is considered a positive change.   Pulmonary Function Assessment:  Pulmonary Function Assessment - 05/03/23 1537       Initial Spirometry Results   FVC% 82.5 %    FEV1% 39.4 %    FEV1/FVC Ratio 36    Comments 12/15/2022      Post Bronchodilator Spirometry Results   FVC% 102.5 %     FEV1% 40.2 %    FEV1/FVC Ratio 30    Comments 12/15/22      Breath   Shortness of Breath  Yes;Fear of Shortness of Breath;Limiting activity;Panic with Shortness of Breath             Exercise Target Goals: Exercise Program Goal: Individual exercise prescription set using results from initial 6 min walk test and THRR while considering  patient's activity barriers and safety.   Exercise Prescription Goal: Initial exercise prescription builds to 30-45 minutes a day of aerobic activity, 2-3 days per week.  Home exercise guidelines will be given to patient during program as part of exercise prescription that the participant will acknowledge.  Activity Barriers & Risk Stratification:  Activity Barriers & Cardiac Risk Stratification - 04/26/23 1323       Activity Barriers & Cardiac Risk Stratification   Activity Barriers Deconditioning;Shortness of Breath;Joint Problems   Left shoulder joint replaced 3 years ago.   Cardiac Risk Stratification Low             6 Minute Walk:  6 Minute Walk     Row Name 05/03/23 1531         6 Minute Walk   Phase Initial     Distance 940 feet     Walk Time 6 minutes     # of Rest Breaks 0     MPH 1.78     METS 2.15     RPE 14     Perceived Dyspnea  2     VO2 Peak 7.54     Symptoms Yes (comment)     Comments SOB, chest tightness 3/10, carrying small e-cylinder     Resting HR 68 bpm     Resting BP 118/66     Resting Oxygen Saturation  93 %     Exercise Oxygen Saturation  during 6 min walk 86 %     Max Ex. HR 106 bpm     Max Ex. BP 148/84     2 Minute Post BP 122/64       Interval HR   1 Minute HR 92     2 Minute HR 106     3 Minute HR 100     4 Minute HR 98     5 Minute HR 102     6 Minute HR 99     2 Minute Post HR 81     Interval Heart Rate? Yes       Interval Oxygen   Interval Oxygen? Yes     Baseline Oxygen Saturation % 93 %  1L NCC     1 Minute Oxygen Saturation % 90 %     1 Minute Liters of Oxygen 3 L  NCP     2  Minute Oxygen Saturation % 87 %     2 Minute Liters of Oxygen 3 L     3 Minute Oxygen Saturation % 86 %     3 Minute Liters of Oxygen 3 L     4 Minute Oxygen Saturation % 86 %     4 Minute Liters of Oxygen 3 L     5 Minute Oxygen Saturation % 87 %     5 Minute Liters of Oxygen 3 L     6 Minute Oxygen Saturation % 86 %     6 Minute Liters of Oxygen 3 L     2 Minute Post Oxygen Saturation % 94 %     2 Minute Post Liters of Oxygen 3 L              Oxygen Initial Assessment:  Oxygen  Initial Assessment - 05/03/23 1536       Home Oxygen   Home Oxygen Device Home Concentrator;Portable Concentrator;E-Tanks    Sleep Oxygen Prescription Continuous    Liters per minute 2    Home Exercise Oxygen Prescription Continuous    Liters per minute 3    Home Resting Oxygen Prescription Continuous    Liters per minute 2    Compliance with Home Oxygen Use Yes      Initial 6 min Walk   Oxygen Used Pulsed;E-Tanks    Liters per minute 3      Program Oxygen Prescription   Program Oxygen Prescription Continuous;E-Tanks    Liters per minute 3      Intervention   Short Term Goals To learn and exhibit compliance with exercise, home and travel O2 prescription;To learn and understand importance of monitoring SPO2 with pulse oximeter and demonstrate accurate use of the pulse oximeter.;To learn and understand importance of maintaining oxygen saturations>88%;To learn and demonstrate proper pursed lip breathing techniques or other breathing techniques. ;To learn and demonstrate proper use of respiratory medications    Long  Term Goals Exhibits compliance with exercise, home  and travel O2 prescription;Verbalizes importance of monitoring SPO2 with pulse oximeter and return demonstration;Maintenance of O2 saturations>88%;Exhibits proper breathing techniques, such as pursed lip breathing or other method taught during program session;Compliance with respiratory medication;Demonstrates proper use of MDI's              Oxygen Re-Evaluation:  Oxygen Re-Evaluation     Row Name 05/11/23 1516 05/18/23 1345 06/29/23 1507         Program Oxygen Prescription   Program Oxygen Prescription -- Continuous;E-Tanks Continuous;E-Tanks     Liters per minute -- -- 3       Home Oxygen   Home Oxygen Device -- Home Concentrator;Portable Concentrator;E-Tanks Home Concentrator;Portable Concentrator;E-Tanks     Sleep Oxygen Prescription -- Continuous Continuous     Liters per minute -- 2 2     Home Exercise Oxygen Prescription -- Continuous Continuous     Liters per minute -- 2.5 2.5     Home Resting Oxygen Prescription -- Continuous --     Liters per minute -- 2 2     Compliance with Home Oxygen Use -- Yes Yes       Goals/Expected Outcomes   Short Term Goals To learn and understand importance of maintaining oxygen saturations>88%;To learn and understand importance of monitoring SPO2 with pulse oximeter and demonstrate accurate use of the pulse oximeter.;To learn and demonstrate proper pursed lip breathing techniques or other breathing techniques.  To learn and understand importance of maintaining oxygen saturations>88%;To learn and understand importance of monitoring SPO2 with pulse oximeter and demonstrate accurate use of the pulse oximeter.;To learn and demonstrate proper pursed lip breathing techniques or other breathing techniques. ;To learn and exhibit compliance with exercise, home and travel O2 prescription;To learn and demonstrate proper use of respiratory medications To learn and understand importance of maintaining oxygen saturations>88%;To learn and understand importance of monitoring SPO2 with pulse oximeter and demonstrate accurate use of the pulse oximeter.;To learn and demonstrate proper pursed lip breathing techniques or other breathing techniques. ;To learn and exhibit compliance with exercise, home and travel O2 prescription;To learn and demonstrate proper use of respiratory medications     Long   Term Goals Maintenance of O2 saturations>88%;Verbalizes importance of monitoring SPO2 with pulse oximeter and return demonstration;Exhibits proper breathing techniques, such as pursed lip breathing or other method taught during program session  Maintenance of O2 saturations>88%;Verbalizes importance of monitoring SPO2 with pulse oximeter and return demonstration;Exhibits proper breathing techniques, such as pursed lip breathing or other method taught during program session;Exhibits compliance with exercise, home  and travel O2 prescription;Demonstrates proper use of MDI's;Compliance with respiratory medication Maintenance of O2 saturations>88%;Verbalizes importance of monitoring SPO2 with pulse oximeter and return demonstration;Exhibits proper breathing techniques, such as pursed lip breathing or other method taught during program session;Exhibits compliance with exercise, home  and travel O2 prescription;Demonstrates proper use of MDI's;Compliance with respiratory medication     Comments Reviewed PLB technique with pt.  Talked about how it works and it's importance in maintaining their exercise saturations. Rashun is doing well in rehab.  He is good about wearing his oxygen when at home and out and about.  However, he does take it off to go to mailbox and gets winded quickly.  He was encourage to wear it even then to maintain saturations.  He is doing well on his medications  He is good about using his PLB. Ibrahem is doing well in rehab. His is wearing his oxygen as precriped and not needing to turn it up during exercise. His oxygen is staying WNL when exercising. It will drop to the lower 90s when he increases his speed or level but with PLB he is able to bring it back up.     Goals/Expected Outcomes Short: Become more profiecient at using PLB.   Long: Become independent at using PLB. Short: Wear O2 out to mailbox Long; Conitnue to monitor saturations closely. Short: Contineu to work on PLB when exercising  Long;  Conitnue to monitor saturations closely.              Oxygen Discharge (Final Oxygen Re-Evaluation):  Oxygen Re-Evaluation - 06/29/23 1507       Program Oxygen Prescription   Program Oxygen Prescription Continuous;E-Tanks    Liters per minute 3      Home Oxygen   Home Oxygen Device Home Concentrator;Portable Concentrator;E-Tanks    Sleep Oxygen Prescription Continuous    Liters per minute 2    Home Exercise Oxygen Prescription Continuous    Liters per minute 2.5    Liters per minute 2    Compliance with Home Oxygen Use Yes      Goals/Expected Outcomes   Short Term Goals To learn and understand importance of maintaining oxygen saturations>88%;To learn and understand importance of monitoring SPO2 with pulse oximeter and demonstrate accurate use of the pulse oximeter.;To learn and demonstrate proper pursed lip breathing techniques or other breathing techniques. ;To learn and exhibit compliance with exercise, home and travel O2 prescription;To learn and demonstrate proper use of respiratory medications    Long  Term Goals Maintenance of O2 saturations>88%;Verbalizes importance of monitoring SPO2 with pulse oximeter and return demonstration;Exhibits proper breathing techniques, such as pursed lip breathing or other method taught during program session;Exhibits compliance with exercise, home  and travel O2 prescription;Demonstrates proper use of MDI's;Compliance with respiratory medication    Comments Eyal is doing well in rehab. His is wearing his oxygen as precriped and not needing to turn it up during exercise. His oxygen is staying WNL when exercising. It will drop to the lower 90s when he increases his speed or level but with PLB he is able to bring it back up.    Goals/Expected Outcomes Short: Contineu to work on PLB when exercising  Long; Conitnue to monitor saturations closely.  Initial Exercise Prescription:  Initial Exercise Prescription - 05/03/23 1500        Date of Initial Exercise RX and Referring Provider   Date 05/03/23    Referring Provider [facility]     Oxygen   Oxygen Continuous    Liters 3    Maintain Oxygen Saturation 88% or higher      NuStep   Level 1    SPM 80    Minutes 15    METs 2.1      REL-XR   Level 1    Speed 50    Minutes 15    METs 2.1      Track   Laps 10    Minutes 15    METs 2      Prescription Details   Frequency (times per week) 2    Duration Progress to 30 minutes of continuous aerobic without signs/symptoms of physical distress      Intensity   THRR 40-80% of Max Heartrate 91-131    Ratings of Perceived Exertion 11-13    Perceived Dyspnea 0-4      Progression   Progression Continue to progress workloads to maintain intensity without signs/symptoms of physical distress.      Resistance Training   Training Prescription Yes    Weight 3 lb    Reps 10-15             Perform Capillary Blood Glucose checks as needed.  Exercise Prescription Changes:   Exercise Prescription Changes     Row Name 05/03/23 1500 05/27/23 1400 06/22/23 1500         Response to Exercise   Blood Pressure (Admit) 118/66 140/78 140/78     Blood Pressure (Exercise) 148/84 152/90 124/84     Blood Pressure (Exit) 112/66 140/70 142/78     Heart Rate (Admit) 68 bpm 77 bpm 85 bpm     Heart Rate (Exercise) 106 bpm 97 bpm 97 bpm     Heart Rate (Exit) 80 bpm 76 bpm 82 bpm     Oxygen Saturation (Admit) 93 % 93 % 94 %     Oxygen Saturation (Exercise) 86 % 95 % 95 %     Oxygen Saturation (Exit) 93 % 95 % 94 %     Rating of Perceived Exertion (Exercise) 14 13 13      Perceived Dyspnea (Exercise) 2 3 2      Symptoms SOB, chest tightness 3/10 -- --     Comments walk test results -- --     Duration -- Continue with 30 min of aerobic exercise without signs/symptoms of physical distress. Continue with 30 min of aerobic exercise without signs/symptoms of physical distress.     Intensity -- THRR  unchanged THRR unchanged       Progression   Progression -- Continue to progress workloads to maintain intensity without signs/symptoms of physical distress. Continue to progress workloads to maintain intensity without signs/symptoms of physical distress.       Resistance Training   Training Prescription -- Yes Yes     Weight -- 3 4     Reps -- 10-15 10-15       Oxygen   Oxygen -- Continuous Continuous     Liters -- 3 3       NuStep   Level -- 3 3     SPM -- 78 91     Minutes -- 15 15     METs -- 1.8 2  REL-XR   Level -- 3 3     Speed -- 42 43     Minutes -- 15 15     METs -- 2 2.1       Oxygen   Maintain Oxygen Saturation -- 88% or higher 88% or higher              Exercise Comments:   Exercise Comments     Row Name 05/11/23 1515           Exercise Comments First full day of exercise!  Patient was oriented to gym and equipment including functions, settings, policies, and procedures.  Patient's individual exercise prescription and treatment plan were reviewed.  All starting workloads were established based on the results of the 6 minute walk test done at initial orientation visit.  The plan for exercise progression was also introduced and progression will be customized based on patient's performance and goals.                Exercise Goals and Review:   Exercise Goals     Row Name 05/03/23 1534             Exercise Goals   Increase Physical Activity Yes       Intervention Provide advice, education, support and counseling about physical activity/exercise needs.;Develop an individualized exercise prescription for aerobic and resistive training based on initial evaluation findings, risk stratification, comorbidities and participant's personal goals.       Expected Outcomes Short Term: Attend rehab on a regular basis to increase amount of physical activity.;Long Term: Add in home exercise to make exercise part of routine and to increase amount of  physical activity.;Long Term: Exercising regularly at least 3-5 days a week.       Increase Strength and Stamina Yes       Intervention Provide advice, education, support and counseling about physical activity/exercise needs.;Develop an individualized exercise prescription for aerobic and resistive training based on initial evaluation findings, risk stratification, comorbidities and participant's personal goals.       Expected Outcomes Short Term: Increase workloads from initial exercise prescription for resistance, speed, and METs.;Short Term: Perform resistance training exercises routinely during rehab and add in resistance training at home;Long Term: Improve cardiorespiratory fitness, muscular endurance and strength as measured by increased METs and functional capacity ( )       Able to understand and use rate of perceived exertion (RPE) scale Yes       Intervention Provide education and explanation on how to use RPE scale       Expected Outcomes Short Term: Able to use RPE daily in rehab to express subjective intensity level;Long Term:  Able to use RPE to guide intensity level when exercising independently       Able to understand and use Dyspnea scale Yes       Intervention Provide education and explanation on how to use Dyspnea scale       Expected Outcomes Short Term: Able to use Dyspnea scale daily in rehab to express subjective sense of shortness of breath during exertion;Long Term: Able to use Dyspnea scale to guide intensity level when exercising independently       Knowledge and understanding of Target Heart Rate Range (THRR) Yes       Intervention Provide education and explanation of THRR including how the numbers were predicted and where they are located for reference       Expected Outcomes Short Term: Able to state/look up THRR;Long Term:  Able to use THRR to govern intensity when exercising independently;Short Term: Able to use daily as guideline for intensity in rehab       Able to  check pulse independently Yes       Intervention Review the importance of being able to check your own pulse for safety during independent exercise;Provide education and demonstration on how to check pulse in carotid and radial arteries.       Expected Outcomes Short Term: Able to explain why pulse checking is important during independent exercise;Long Term: Able to check pulse independently and accurately       Understanding of Exercise Prescription Yes       Intervention Provide education, explanation, and written materials on patient's individual exercise prescription       Expected Outcomes Short Term: Able to explain program exercise prescription;Long Term: Able to explain home exercise prescription to exercise independently                Exercise Goals Re-Evaluation :  Exercise Goals Re-Evaluation     Row Name 05/11/23 1515 05/18/23 1332 06/29/23 1502         Exercise Goal Re-Evaluation   Exercise Goals Review Able to understand and use rate of perceived exertion (RPE) scale;Knowledge and understanding of Target Heart Rate Range (THRR);Understanding of Exercise Prescription;Able to understand and use Dyspnea scale Increase Physical Activity;Increase Strength and Stamina;Understanding of Exercise Prescription Increase Physical Activity;Increase Strength and Stamina;Understanding of Exercise Prescription     Comments Reviewed RPE and dyspnea scale, THR and program prescription with pt today.  Pt voiced understanding and was given a copy of goals to take home. Alakai is off to a good start in rehab.  He is already starting to feel better.  He finds that he is no longer sitting as much.  He does note some dry mouth and was encouraged to keep drinking water and exercising regularly.  He does have some weights and has already started to use them at home.  We will review home exercise after a few more sessions. Ladanian is doing well in rehab. He is increasing his levels on the XR and Nustep and is  tolerationg the resistance well. He has noticed his SOB getting better when exercising.     Expected Outcomes Short: Use RPE daily to regulate intensity.  Long: Follow program prescription in THR. Short: Conitnue to move more Long: Continue to attend rehab regularly. Short: Conitnue to move more Long: Continue to attend rehab regularly.              Discharge Exercise Prescription (Final Exercise Prescription Changes):  Exercise Prescription Changes - 06/22/23 1500       Response to Exercise   Blood Pressure (Admit) 140/78    Blood Pressure (Exercise) 124/84    Blood Pressure (Exit) 142/78    Heart Rate (Admit) 85 bpm    Heart Rate (Exercise) 97 bpm    Heart Rate (Exit) 82 bpm    Oxygen Saturation (Admit) 94 %    Oxygen Saturation (Exercise) 95 %    Oxygen Saturation (Exit) 94 %    Rating of Perceived Exertion (Exercise) 13    Perceived Dyspnea (Exercise) 2    Duration Continue with 30 min of aerobic exercise without signs/symptoms of physical distress.    Intensity THRR unchanged      Progression   Progression Continue to progress workloads to maintain intensity without signs/symptoms of physical distress.      Paramedic  Prescription Yes    Weight 4    Reps 10-15      Oxygen   Oxygen Continuous    Liters 3      NuStep   Level 3    SPM 91    Minutes 15    METs 2      REL-XR   Level 3    Speed 43    Minutes 15    METs 2.1      Oxygen   Maintain Oxygen Saturation 88% or higher             Nutrition:  Target Goals: Understanding of nutrition guidelines, daily intake of sodium 1500mg , cholesterol 200mg , calories 30% from fat and 7% or less from saturated fats, daily to have 5 or more servings of fruits and vegetables.  Biometrics:  Pre Biometrics - 05/03/23 1534       Pre Biometrics   Height 5\' 6"  (1.676 m)    Weight 184 lb 8 oz (83.7 kg)    Waist Circumference 40.5 inches    Hip Circumference 40 inches    Waist to Hip Ratio  1.01 %    BMI (Calculated) 29.79    Grip Strength 32.1 kg    Single Leg Stand 4.4 seconds              Nutrition Therapy Plan and Nutrition Goals:  Nutrition Therapy & Goals - 05/03/23 1535       Intervention Plan   Intervention Prescribe, educate and counsel regarding individualized specific dietary modifications aiming towards targeted core components such as weight, hypertension, lipid management, diabetes, heart failure and other comorbidities.;Nutrition handout(s) given to patient.    Expected Outcomes Short Term Goal: Understand basic principles of dietary content, such as calories, fat, sodium, cholesterol and nutrients.;Long Term Goal: Adherence to prescribed nutrition plan.             Nutrition Assessments:  MEDIFICTS Score Key: >=70 Need to make dietary changes  40-70 Heart Healthy Diet <= 40 Therapeutic Level Cholesterol Diet  Flowsheet Row PULMONARY REHAB COPD ORIENTATION from 05/03/2023 in [facility]  Picture Your Plate Total Score on Admission 59      Picture Your Plate Scores: <16 Unhealthy dietary pattern with much room for improvement. 41-50 Dietary pattern unlikely to meet recommendations for good health and room for improvement. 51-60 More healthful dietary pattern, with some room for improvement.  >60 Healthy dietary pattern, although there may be some specific behaviors that could be improved.    Nutrition Goals Re-Evaluation:  Nutrition Goals Re-Evaluation     Row Name 05/18/23 1339 06/29/23 1504           Goals   Nutrition Goal Healthy Diet Healthy Diet      Comment Lavaris is working on his diet.  He tries to stick to it as much as possible.  He aims to stay away from sweets and soda.  He wants to work on losing weight. He is doing well with a variety of fruits and vegetables.  He tries to get good proteins.  He really enjoys baked/broiled chicken with vegetables.  He is not a big breakfast eater and usually gets  about two meals a day. Sumit is continuing to work on his diet and  is working on losing weight.      Expected Outcome Short: Conitnue to cut back on sweets Long: Aim for a variety. Short: Conitnue to cut back on sweets Long: Aim for a variety.  Nutrition Goals Discharge (Final Nutrition Goals Re-Evaluation):  Nutrition Goals Re-Evaluation - 06/29/23 1504       Goals   Nutrition Goal Healthy Diet    Comment Trei is continuing to work on his diet and  is working on losing weight.    Expected Outcome Short: Conitnue to cut back on sweets Long: Aim for a variety.             Psychosocial: Target Goals: Acknowledge presence or absence of significant depression and/or stress, maximize coping skills, provide positive support system. Participant is able to verbalize types and ability to use techniques and skills needed for reducing stress and depression.  Initial Review & Psychosocial Screening:  Initial Psych Review & Screening - 04/26/23 1340       Initial Review   Current issues with Current Sleep Concerns;Current Psychotropic Meds;History of Depression;Current Depression   PSTD with visual hallucinations     Family Dynamics   Good Support System? Yes    Comments His 2 daughters are his support.      Barriers   Psychosocial barriers to participate in program The patient should benefit from training in stress management and relaxation.      Screening Interventions   Interventions Encouraged to exercise;To provide support and resources with identified psychosocial needs;Provide feedback about the scores to participant    Expected Outcomes Short Term goal: Utilizing psychosocial counselor, staff and physician to assist with identification of specific Stressors or current issues interfering with healing process. Setting desired goal for each stressor or current issue identified.;Long Term Goal: Stressors or current issues are controlled or eliminated.;Short Term goal:  Identification and review with participant of any Quality of Life or Depression concerns found by scoring the questionnaire.;Long Term goal: The participant improves quality of Life and PHQ9 Scores as seen by post scores and/or verbalization of changes             Quality of Life Scores:  Scores of 19 and below usually indicate a poorer quality of life in these areas.  A difference of  2-3 points is a clinically meaningful difference.  A difference of 2-3 points in the total score of the Quality of Life Index has been associated with significant improvement in overall quality of life, self-image, physical symptoms, and general health in studies assessing change in quality of life.   PHQ-9: Review Flowsheet       05/18/2023 05/03/2023  Depression screen PHQ 2/9  Decreased Interest 1 2  Down, Depressed, Hopeless 0 2  PHQ - 2 Score 1 4  Altered sleeping 0 2  Tired, decreased energy 3 3  Change in appetite 0 2  Feeling bad or failure about yourself  0 2  Trouble concentrating 2 3  Moving slowly or fidgety/restless 2 2  Suicidal thoughts 0 0  PHQ-9 Score 8 18  Difficult doing work/chores Somewhat difficult Very difficult   Interpretation of Total Score  Total Score Depression Severity:  1-4 = Minimal depression, 5-9 = Mild depression, 10-14 = Moderate depression, 15-19 = Moderately severe depression, 20-27 = Severe depression   Psychosocial Evaluation and Intervention:  Psychosocial Evaluation - 04/26/23 1343       Psychosocial Evaluation & Interventions   Interventions Stress management education;Relaxation education;Encouraged to exercise with the program and follow exercise prescription    Comments Patient was referred to [place] with COPD from the [place]. He served in [place] and does report some depression as well as PTSD with occasionally visual and auditory hallucinations  which he says is treated with Buspar. He says his daughter was living with him but she has moved out and he now  lives alone which has been a diffucult ajustment for him. He has 2 daughters that do support him with his needs. He says he does attend church which helps him socially. He reports trouble staying asleep mainly due to feelings of choking on sinus drainage. He used to work painting houses and he thinks the fumes from the paint and exposure to chemicals while serving in the [employer] damaged his lungs. He is walking some at [facility] but says he can't do a lot. His goals for the program are to improve his strength and stamina and breathing. He has no barriers identified to completing the program.    Expected Outcomes Short Term: start the program and attend consistently. Long Term: meet his personal goals.    Continue Psychosocial Services  Follow up required by staff             Psychosocial Re-Evaluation:  Psychosocial Re-Evaluation     Row Name 05/18/23 1334 06/29/23 1504           Psychosocial Re-Evaluation   Current issues with Current Stress Concerns;Current Depression;Current Psychotropic Meds Current Stress Concerns;Current Depression;Current Psychotropic Meds      Comments Padraig is doing well in rehab so far.  He is still seeing his counselor at the [place] every month for his PTSD.  He s already feeling better as he is moving more.  He is sleeping well for most part.  Overall, he is feeling better.  His PHQ has improved by 10 pts already, we will continue to check in with him.  He tries not to dwell on things too much and enjoyed stress education last week. Morty is doing well in rehab. He continuse to see his [place]. He is overall feeling better with exercise.      Expected Outcomes Short: Continue to exercise for mental boost Long: conitnue to work with PTSD Short: Continue to exercise for mental boost Long: conitnue to work with PTSD               Psychosocial Discharge (Final Psychosocial Re-Evaluation):  Psychosocial Re-Evaluation - 06/29/23 1504       Psychosocial  Re-Evaluation   Current issues with Current Stress Concerns;Current Depression;Current Psychotropic Meds    Comments Beckham is doing well in rehab. He continuse to see his [place]. He is overall feeling better with exercise.    Expected Outcomes Short: Continue to exercise for mental boost Long: conitnue to work with PTSD              Education: Education Goals: Education classes will be provided on a weekly basis, covering required topics. Participant will state understanding/return demonstration of topics presented.  Learning Barriers/Preferences:  Learning Barriers/Preferences - 04/26/23 1327       Learning Barriers/Preferences   Learning Barriers None    Learning Preferences Skilled Demonstration;Audio;Written Material             Education Topics: How Lungs Work and Diseases: - Discuss the anatomy of the lungs and diseases that can affect the lungs, such as COPD. Flowsheet Row PULMONARY REHAB CHRONIC OBSTRUCTIVE PULMONARY DISEASE from 06/03/2023 in [facility]  Date 06/03/23  Educator jh  Instruction Review Code 1- Verbalizes Understanding       Exercise: -Discuss the importance of exercise, FITT principles of exercise, normal and abnormal responses to exercise, and how to  exercise safely.   Environmental Irritants: -Discuss types of environmental irritants and how to limit exposure to environmental irritants.   Meds/Inhalers and oxygen: - Discuss respiratory medications, definition of an inhaler and oxygen, and the proper way to use an inhaler and oxygen.   Energy Saving Techniques: - Discuss methods to conserve energy and decrease shortness of breath when performing activities of daily living.    Bronchial Hygiene / Breathing Techniques: - Discuss breathing mechanics, pursed-lip breathing technique,  proper posture, effective ways to clear airways, and other functional breathing techniques   Cleaning Equipment: - Provides  group verbal and written instruction about the health risks of elevated stress, cause of high stress, and healthy ways to reduce stress.   Nutrition I: Fats: - Discuss the types of cholesterol, what cholesterol does to the body, and how cholesterol levels can be controlled.   Nutrition II: Labels: -Discuss the different components of food labels and how to read food labels.   Respiratory Infections: - Discuss the signs and symptoms of respiratory infections, ways to prevent respiratory infections, and the importance of seeking medical treatment when having a respiratory infection.   Stress I: Signs and Symptoms: - Discuss the causes of stress, how stress may lead to anxiety and depression, and ways to limit stress. Flowsheet Row PULMONARY REHAB CHRONIC OBSTRUCTIVE PULMONARY DISEASE from 06/03/2023 in [facility]  Date 05/13/23  Educator [facility]  Instruction Review Code 1- Verbalizes Understanding       Stress II: Relaxation: -Discuss relaxation techniques to limit stress.   Oxygen for Home/Travel: - Discuss how to prepare for travel when on oxygen and proper ways to transport and store oxygen to ensure safety.   Knowledge Questionnaire Score:  Knowledge Questionnaire Score - 05/03/23 1535       Knowledge Questionnaire Score   Pre Score 11/18             Core Components/Risk Factors/Patient Goals at Admission:  Personal Goals and Risk Factors at Admission - 05/03/23 1535       Core Components/Risk Factors/Patient Goals on Admission    Weight Management Yes;Weight Loss    Intervention Weight Management: Develop a combined nutrition and exercise program designed to reach desired caloric intake, while maintaining appropriate intake of nutrient and fiber, sodium and fats, and appropriate energy expenditure required for the weight goal.;Weight Management: Provide education and appropriate resources to help participant work on and attain dietary  goals.;Weight Management/Obesity: Establish reasonable short term and long term weight goals.    Admit Weight 184 lb 8 oz (83.7 kg)    Goal Weight: Short Term 179 lb (81.2 kg)    Goal Weight: Long Term 165 lb (74.8 kg)    Expected Outcomes Short Term: Continue to assess and modify interventions until short term weight is achieved;Long Term: Adherence to nutrition and physical activity/exercise program aimed toward attainment of established weight goal;Weight Loss: Understanding of general recommendations for a balanced deficit meal plan, which promotes 1-2 lb weight loss per week and includes a negative energy balance of [phone] kcal/d;Understanding recommendations for meals to include 15-35% energy as protein, 25-35% energy from fat, 35-60% energy from carbohydrates, less than 200mg  of dietary cholesterol, 20-35 gm of total fiber daily;Understanding of distribution of calorie intake throughout the day with the consumption of 4-5 meals/snacks    Improve shortness of breath with ADL's Yes    Expected Outcomes Short Term: Improve cardiorespiratory fitness to achieve a reduction of symptoms when performing ADLs;Long Term: Be able to  perform more ADLs without symptoms or delay the onset of symptoms    Increase knowledge of respiratory medications and ability to use respiratory devices properly  Yes    Intervention Provide education and demonstration as needed of appropriate use of medications, inhalers, and oxygen therapy.    Expected Outcomes Short Term: Achieves understanding of medications use. Understands that oxygen is a medication prescribed by physician. Demonstrates appropriate use of inhaler and oxygen therapy.;Long Term: Maintain appropriate use of medications, inhalers, and oxygen therapy.    Intervention Provide education on lifestyle modifcations including regular physical activity/exercise, weight management, moderate sodium restriction and increased consumption of fresh fruit, vegetables, and  low fat dairy, alcohol moderation, and smoking cessation.;Monitor prescription use compliance.    Expected Outcomes Short Term: Continued assessment and intervention until BP is < 140/80mm HG in hypertensive participants. < 130/76mm HG in hypertensive participants with diabetes, heart failure or chronic kidney disease.;Long Term: Maintenance of blood pressure at goal levels.    Lipids Yes    Intervention Provide education and support for participant on nutrition & aerobic/resistive exercise along with prescribed medications to achieve LDL 70mg , HDL >40mg .    Expected Outcomes Short Term: Participant states understanding of desired cholesterol values and is compliant with medications prescribed. Participant is following exercise prescription and nutrition guidelines.;Long Term: Cholesterol controlled with medications as prescribed, with individualized exercise RX and with personalized nutrition plan. Value goals: LDL < 70mg , HDL > 40 mg.             Core Components/Risk Factors/Patient Goals Review:   Goals and Risk Factor Review     Row Name 05/18/23 1341 06/29/23 1505           Core Components/Risk Factors/Patient Goals Review   Personal Goals Review Weight Management/Obesity;Improve shortness of breath with ADL's;Increase knowledge of respiratory medications and ability to use respiratory devices properly.;Hypertension;Lipids Weight Management/Obesity;Improve shortness of breath with ADL's;Increase knowledge of respiratory medications and ability to use respiratory devices properly.;Hypertension;Lipids      Review Hanz is doing well in rehab.  He wants to lose more weight is working on continuing to improve his diet to help.  His breathing has gotten better as he is not sitting as much anymore.  His blood pressures are doing well and he does check them at home.  He keeps a record to show his doctors regularly.  He was at [facility] yesterday for a general f/u and is doing well.  His breathing is  doing well, but has some rougher days.  He is doing well with his meds.  He is good about wearing his oxygen when out and about but not out to [employer].  He was encouraged to wear it regularly to maintain saturations. Zaquan continues to do well in rehab. He is watching what he eats so he is able to lose weight and improve his diet. His blood pressure continues to do well in rehab and his oxygen is staying WNL during exercise. His is working on his PLB when in rehab=.      Expected Outcomes Short: Continue to work on weight loss.  Long: Continue to montior risk factors. Short: Continue to work on weight loss.  Long: Continue to montior risk factors.               Core Components/Risk Factors/Patient Goals at Discharge (Final Review):   Goals and Risk Factor Review - 06/29/23 1505       Core Components/Risk Factors/Patient Goals Review   Personal Goals  Review Weight Management/Obesity;Improve shortness of breath with ADL's;Increase knowledge of respiratory medications and ability to use respiratory devices properly.;Hypertension;Lipids    Review Eligha continues to do well in rehab. He is watching what he eats so he is able to lose weight and improve his diet. His blood pressure continues to do well in rehab and his oxygen is staying WNL during exercise. His is working on his PLB when in rehab=.    Expected Outcomes Short: Continue to work on weight loss.  Long: Continue to montior risk factors.             ITP Comments:  ITP Comments     Row Name 05/03/23 1530 05/04/23 1436 05/11/23 1515 06/02/23 1138 06/30/23 1349   ITP Comments Patient attend orientation today.  Patient is attending Pulmonary Rehabilitation Program.  Documentation for diagnosis can be found in [place] notes in Media for today's appointment.  Reviewed medical chart, RPE/RPD, gym safety, and program guidelines.  Patient was fitted to equipment they will be using during rehab.  Patient is scheduled to start exercise on Tues 05/11/23  at 1330.   Initial ITP created and sent for review and signature by Dr. Erick Blinks, Medical Director for Pulmonary Rehabilitation Program. 30 day review completed. ITP sent to Dr.Jehanzeb Memon, Medical Director of  Pulmonary Rehab. Continue with ITP unless changes are made by physician.  Only attended orientation yesterday. First full day of exercise!  Patient was oriented to gym and equipment including functions, settings, policies, and procedures.  Patient's individual exercise prescription and treatment plan were reviewed.  All starting workloads were established based on the results of the 6 minute walk test done at initial orientation visit.  The plan for exercise progression was also introduced and progression will be customized based on patient's performance and goals. 30 day review completed. ITP sent to Dr.Jehanzeb Memon, Medical Director of  Pulmonary Rehab. Continue with ITP unless changes are made by physician. 30 day review completed. ITP sent to Dr.Jehanzeb Memon, Medical Director of  Pulmonary Rehab. Continue with ITP unless changes are made by physician.            Comments: 30 day review

## 2023-07-01 ENCOUNTER — Encounter (HOSPITAL_COMMUNITY)
Admission: RE | Admit: 2023-07-01 | Discharge: 2023-07-01 | Disposition: A | Discharge status: Home / Self Care | Payer: No Typology Code available for payment source | Source: Ambulatory Visit

## 2023-07-01 DIAGNOSIS — J449 Chronic obstructive pulmonary disease, unspecified: Secondary | ICD-10-CM

## 2023-07-01 NOTE — Progress Notes (Signed)
 Daily Session Note  Patient Details  Name: Chad Hensley MRN: [id number] Date of Birth: Aug 27, 1950 Referring Provider:   Flowsheet Row PULMONARY REHAB COPD ORIENTATION from 05/03/2023 in [facility]ARDIAC REHABILITATION  Referring Provider Leana [facility]]       Encounter Date: 07/01/2023  Check In:  Session Check In - 07/01/23 1315       Check-In   Supervising physician immediately available to respond to emergencies See telemetry face sheet for immediately available MD    Location [facility]    Staff Present Avanell Shackleton BSN, RN;Debra Laural Benes, RN, BSN    Virtual Visit No    Medication changes reported     No    Fall or balance concerns reported    No    Tobacco Cessation No Change    Warm-up and Cool-down Performed on first and last piece of equipment    Resistance Training Performed Yes    VAD Patient? No    PAD/SET Patient? No      Pain Assessment   Currently in Pain? No/denies    Pain Score 0-No pain    Multiple Pain Sites No             Capillary Blood Glucose: No results found for this or any previous visit (from the past 24 hours).    Social History   Tobacco Use  Smoking Status Former   Current packs/day: 0.00   Average packs/day: 0.5 packs/day for 2.0 years (1.0 ttl pk-yrs)   Types: Cigarettes   Start date: 04/03/1969   Quit date: 05/04/1970   Years since quitting: 53.1  Smokeless Tobacco Not on file    Goals Met:  Proper associated with RPD/PD & O2 Sat Independence with exercise equipment Using PLB without cueing & demonstrates good technique Exercise tolerated well Queuing for purse lip breathing No report of concerns or symptoms today Strength training completed today  Goals Unmet:  Not Applicable  Comments: Marland KitchenMarland KitchenPt able to follow exercise prescription today without complaint.  Will continue to monitor for progression.

## 2023-07-06 ENCOUNTER — Encounter (HOSPITAL_COMMUNITY)
Admission: RE | Admit: 2023-07-06 | Discharge: 2023-07-06 | Disposition: A | Discharge status: Home / Self Care | Payer: No Typology Code available for payment source | Source: Ambulatory Visit

## 2023-07-06 DIAGNOSIS — J449 Chronic obstructive pulmonary disease, unspecified: Secondary | ICD-10-CM | POA: Insufficient documentation

## 2023-07-06 NOTE — Progress Notes (Signed)
 Daily Session Note  Patient Details  Name: Chad Hensley MRN: [id number] Date of Birth: 17-Apr-1951 Referring Provider:   Flowsheet Row PULMONARY REHAB COPD ORIENTATION from 05/03/2023 in [facility] CARDIAC REHABILITATION  Referring Provider Leana [facility]]       Encounter Date: 07/06/2023  Check In:  Session Check In - 07/06/23 1330       Check-In   Supervising physician immediately available to respond to emergencies See telemetry face sheet for immediately available MD    Location [facility]    Staff Present Ross Ludwig, BS, Exercise Physiologist;Akul Leggette Roseanne Reno, BSN, RN, WTA-C    Virtual Visit No    Medication changes reported     No    Fall or balance concerns reported    No    Tobacco Cessation No Change    Warm-up and Cool-down Performed on first and last piece of equipment    Resistance Training Performed Yes    VAD Patient? No    PAD/SET Patient? No      Pain Assessment   Currently in Pain? No/denies    Multiple Pain Sites No             Capillary Blood Glucose: No results found for this or any previous visit (from the past 24 hours).    Social History   Tobacco Use  Smoking Status Former   Current packs/day: 0.00   Average packs/day: 0.5 packs/day for 2.0 years (1.0 ttl pk-yrs)   Types: Cigarettes   Start date: 04/03/1969   Quit date: 05/04/1970   Years since quitting: 53.2  Smokeless Tobacco Not on file    Goals Met:  Proper associated with RPD/PD & O2 Sat Independence with exercise equipment Improved SOB with ADL's Exercise tolerated well No report of concerns or symptoms today Strength training completed today  Goals Unmet:  Not Applicable  Comments: Pt able to follow exercise prescription today without complaint.  Will continue to monitor for progression.

## 2023-07-08 ENCOUNTER — Encounter (HOSPITAL_COMMUNITY): Payer: No Typology Code available for payment source

## 2023-07-13 ENCOUNTER — Encounter (HOSPITAL_COMMUNITY): Payer: No Typology Code available for payment source

## 2023-07-15 ENCOUNTER — Ambulatory Visit (HOSPITAL_COMMUNITY): Payer: No Typology Code available for payment source

## 2023-07-20 ENCOUNTER — Encounter (HOSPITAL_COMMUNITY): Payer: No Typology Code available for payment source

## 2023-07-22 ENCOUNTER — Ambulatory Visit (HOSPITAL_COMMUNITY): Payer: No Typology Code available for payment source

## 2023-07-27 ENCOUNTER — Encounter (HOSPITAL_COMMUNITY)
Admission: RE | Admit: 2023-07-27 | Discharge: 2023-07-27 | Disposition: A | Discharge status: Home / Self Care | Payer: No Typology Code available for payment source | Source: Ambulatory Visit

## 2023-07-27 DIAGNOSIS — J449 Chronic obstructive pulmonary disease, unspecified: Secondary | ICD-10-CM

## 2023-07-27 NOTE — Progress Notes (Signed)
 Daily Session Note  Patient Details  Name: Chad Hensley MRN: [id number] Date of Birth: 1950/06/06 Referring Provider:   Flowsheet Row PULMONARY REHAB COPD ORIENTATION from 05/03/2023 in [facility] C[facility]IAC RE[facility]  Referring Provider [facility]]       Encounter Date: 07/27/2023  Check In:  Session Check In - 07/27/23 1401       Check-In   Supervising physician immediately available to respond to emergencies See telemetry face sheet for immediately available MD    Location [facility]    Staff Present Fabio Pierce, MA, RCEP, CCRP, CCET;Heather Fredric Mare, [place], Exercise Physiologist;Phyllis Billingsley, RN;Brittany Roseanne Reno, BSN, RN, WTA-C    Virtual Visit No    Medication changes reported     No    Fall or balance concerns reported    No    Warm-up and Cool-down Performed on first and last piece of equipment    Resistance Training Performed Yes    VAD Patient? No    PAD/SET Patient? No      Pain Assessment   Currently in Pain? No/denies             Capillary Blood Glucose: No results found for this or any previous visit (from the past 24 hours).    Social History   Tobacco Use  Smoking Status Former   Current packs/day: 0.00   Average packs/day: 0.5 packs/day for 2.0 years (1.0 ttl pk-yrs)   Types: Cigarettes   Start date: 04/03/1969   Quit date: 05/04/1970   Years since quitting: 53.2  Smokeless Tobacco Not on file    Goals Met:  Proper associated with RPD/PD & O2 Sat Independence with exercise equipment Using PLB without cueing & demonstrates good technique Exercise tolerated well No report of concerns or symptoms today Strength training completed today  Goals Unmet:  Not Applicable  Comments: Pt able to follow exercise prescription today without complaint.  Will continue to monitor for progression.  Chad Hensley returned today. He had been out with a sore knee as it popped in class when standing the last time he was  here at the beginning of the month. He strained the tendons and may have small tear but was told he could exercise with a supportive knee brace on during class.

## 2023-07-28 ENCOUNTER — Encounter (HOSPITAL_COMMUNITY): Payer: Self-pay | Admitting: *Deleted

## 2023-07-28 DIAGNOSIS — J449 Chronic obstructive pulmonary disease, unspecified: Secondary | ICD-10-CM

## 2023-07-28 NOTE — Progress Notes (Signed)
 Pulmonary Individual Treatment Plan  Patient Details  Name: Chad Hensley MRN: [id number] Date of Birth: [birth year] Referring Provider:   Flowsheet Row PULMONARY REHAB COPD ORIENTATION from 05/03/2023 in [facility] CARDIAC REHABILITATION  Referring Provider Briones, [facility]]       Initial Encounter Date:  Flowsheet Row PULMONARY REHAB COPD ORIENTATION from 05/03/2023 in [place] CARDIAC REHABILITATION  Date 05/03/23       Visit Diagnosis: Chronic obstructive pulmonary disease, unspecified COPD type (HCC)  Patient's Home Medications on Admission:   Current Outpatient Medications:    acetaminophen (TYLENOL) 500 MG tablet, Take 2 tablets (1,000 mg total) by mouth every 8 (eight) hours as needed for headache., Disp: , Rfl:    ALBUTEROL IN, Inhale into the lungs as needed., Disp: , Rfl:    amLODipine (NORVASC) 5 MG tablet, Take 5 mg by mouth daily. If BP over 130/85, Disp: , Rfl:    ascorbic acid (VITAMIN C) 500 MG tablet, Take 500 mg by mouth daily., Disp: , Rfl:    aspirin EC 81 MG tablet, Take 1 tablet (81 mg total) by mouth daily. Swallow whole., Disp: 30 tablet, Rfl: 12   atorvastatin (LIPITOR) 20 MG tablet, Take 20 mg by mouth daily., Disp: , Rfl:    buPROPion (WELLBUTRIN SR) 150 MG 12 hr tablet, Take 150 mg by mouth 2 (two) times daily., Disp: , Rfl:    carvedilol (COREG) 12.5 MG tablet, Take 1 tablet (12.5 mg total) by mouth 2 (two) times daily with a meal., Disp: 60 tablet, Rfl: 2   cetirizine (ZYRTEC) 10 MG tablet, Take 10 mg by mouth daily., Disp: , Rfl:    Cholecalciferol 2000 UNITS TABS, Take by mouth daily., Disp: , Rfl:    doxycycline (VIBRA-TABS) 100 MG tablet, Take 1 tablet (100 mg total) by mouth every 12 (twelve) hours., Disp: 8 tablet, Rfl: 0   famotidine (PEPCID) 10 MG tablet, Take 2 tablets (20 mg total) by mouth at bedtime., Disp: , Rfl:    fluticasone-salmeterol (WIXELA INHUB) 250-50 MCG/ACT AEPB, Inhale 1 puff into the lungs in the morning and at  bedtime., Disp: , Rfl:    furosemide (LASIX) 20 MG tablet, Take 1 tablet (20 mg total) by mouth daily., Disp: 30 tablet, Rfl: 3   guaiFENesin (MUCINEX) 600 MG 12 hr tablet, Take 1 tablet (600 mg total) by mouth 2 (two) times daily., Disp: 10 tablet, Rfl: 0   hydrOXYzine (ATARAX) 10 MG tablet, Take 10 mg by mouth 3 (three) times daily as needed., Disp: , Rfl:    isosorbide mononitrate (IMDUR) 30 MG 24 hr tablet, Take 1 tablet (30 mg total) by mouth daily., Disp: 30 tablet, Rfl: 2   losartan (COZAAR) 50 MG tablet, Take 1 tablet (50 mg total) by mouth daily., Disp: 30 tablet, Rfl: 2   Omega-3 Fatty Acids (FISH OIL PO), Take 1 capsule by mouth 2 (two) times daily., Disp: , Rfl:    omeprazole (PRILOSEC) 20 MG capsule, Take 20 mg by mouth daily., Disp: , Rfl:    prazosin (MINIPRESS) 2 MG capsule, Take 2 mg by mouth at bedtime., Disp: , Rfl:    predniSONE (DELTASONE) 10 MG tablet, Prednisone 40 mg po daily x 1 day then Prednisone 30 mg po daily x 1 day then Prednisone 20 mg po daily x 1 day then Prednisone 10 mg daily x 1 day then stop..., Disp: 10 tablet, Rfl: 0   Tiotropium Bromide-Olodaterol (STIOLTO RESPIMAT) 2.5-2.5 MCG/ACT AERS, Inhale into the lungs  daily., Disp: , Rfl:    vitamin B-12 (CYANOCOBALAMIN) 50 MCG tablet, Take 50 mcg by mouth daily., Disp: , Rfl:   Past Medical History: Past Medical History:  Diagnosis Date   COPD (chronic obstructive pulmonary disease) (HCC)    Hypertension    PTSD (post-traumatic stress disorder) 05/04/1976    Tobacco Use: Social History   Tobacco Use  Smoking Status Former   Current packs/day: 0.00   Average packs/day: 0.5 packs/day for 2.0 years (1.0 ttl pk-yrs)   Types: Cigarettes   Start date: 04/03/1969   Quit date: 05/04/1970   Years since quitting: 53.2  Smokeless Tobacco Not on file    Labs: Review Flowsheet       Latest Ref Rng & Units 05/01/2008 02/15/2022 02/16/2022  Labs for ITP Cardiac and Pulmonary Rehab  Cholestrol 0 - 200 mg/dL - -  782   LDL (calc) 0 - 99 mg/dL - - 73   HDL-C >95 mg/dL - - 33   Trlycerides <621 mg/dL - - 308   Hemoglobin M5H 4.8 - 5.6 % - 5.4  -  TCO2 0 - 100 mmol/L 25  - -    Capillary Blood Glucose: No results found for: "GLUCAP"   Pulmonary Assessment Scores:  Pulmonary Assessment Scores     Row Name 05/03/23 1538         ADL UCSD   ADL Phase Entry     SOB Score total 100     Rest 2     Walk 3     Stairs 5     Bath 4     Dress 3     Shop 3       CAT Score   CAT Score 21       mMRC Score   mMRC Score 3             UCSD: Self-administered rating of dyspnea associated with activities of daily living (ADLs) 6-point scale (0 = "not at all" to 5 = "maximal or unable to do because of breathlessness")  Scoring Scores range from 0 to 120.  Minimally important difference is 5 units  CAT: CAT can identify the health impairment of COPD patients and is better correlated with disease progression.  CAT has a scoring range of zero to 40. The CAT score is classified into four groups of low (less than 10), medium (10 - 20), high (21-30) and very high (31-40) based on the impact level of disease on health status. A CAT score over 10 suggests significant symptoms.  A worsening CAT score could be explained by an exacerbation, poor medication adherence, poor inhaler technique, or progression of COPD or comorbid conditions.  CAT MCID is 2 points  mMRC: mMRC (Modified Medical Research Council) Dyspnea Scale is used to assess the degree of baseline functional disability in patients of respiratory disease due to dyspnea. No minimal important difference is established. A decrease in score of 1 point or greater is considered a positive change.   Pulmonary Function Assessment:  Pulmonary Function Assessment - 05/03/23 1537       Initial Spirometry Results   FVC% 82.5 %    FEV1% 39.4 %    FEV1/FVC Ratio 36    Comments 12/15/2022      Post Bronchodilator Spirometry Results   FVC% 102.5 %     FEV1% 40.2 %    FEV1/FVC Ratio 30    Comments 12/15/22      Breath   Shortness of Breath  Yes;Fear of Shortness of Breath;Limiting activity;Panic with Shortness of Breath             Exercise Target Goals: Exercise Program Goal: Individual exercise prescription set using results from initial 6 min walk test and THRR while considering  patient's activity barriers and safety.   Exercise Prescription Goal: Initial exercise prescription builds to 30-45 minutes a day of aerobic activity, 2-3 days per week.  Home exercise guidelines will be given to patient during program as part of exercise prescription that the participant will acknowledge.  Activity Barriers & Risk Stratification:  Activity Barriers & Cardiac Risk Stratification - 04/26/23 1323       Activity Barriers & Cardiac Risk Stratification   Activity Barriers Deconditioning;Shortness of Breath;Joint Problems   Left shoulder joint replaced 3 years ago.   Cardiac Risk Stratification Low             6 Minute Walk:  6 Minute Walk     Row Name 05/03/23 1531         6 Minute Walk   Phase Initial     Distance 940 feet     Walk Time 6 minutes     # of Rest Breaks 0     MPH 1.78     METS 2.15     RPE 14     Perceived Dyspnea  2     VO2 Peak 7.54     Symptoms Yes (comment)     Comments SOB, chest tightness 3/10, carrying small e-cylinder     Resting HR 68 bpm     Resting BP 118/66     Resting Oxygen Saturation  93 %     Exercise Oxygen Saturation  during 6 min walk 86 %     Max Ex. HR 106 bpm     Max Ex. BP 148/84     2 Minute Post BP 122/64       Interval HR   1 Minute HR 92     2 Minute HR 106     3 Minute HR 100     4 Minute HR 98     5 Minute HR 102     6 Minute HR 99     2 Minute Post HR 81     Interval Heart Rate? Yes       Interval Oxygen   Interval Oxygen? Yes     Baseline Oxygen Saturation % 93 %  1L NCC     1 Minute Oxygen Saturation % 90 %     1 Minute Liters of Oxygen 3 L  NCP     2  Minute Oxygen Saturation % 87 %     2 Minute Liters of Oxygen 3 L     3 Minute Oxygen Saturation % 86 %     3 Minute Liters of Oxygen 3 L     4 Minute Oxygen Saturation % 86 %     4 Minute Liters of Oxygen 3 L     5 Minute Oxygen Saturation % 87 %     5 Minute Liters of Oxygen 3 L     6 Minute Oxygen Saturation % 86 %     6 Minute Liters of Oxygen 3 L     2 Minute Post Oxygen Saturation % 94 %     2 Minute Post Liters of Oxygen 3 L              Oxygen Initial Assessment:  Oxygen  Initial Assessment - 05/03/23 1536       Home Oxygen   Home Oxygen Device Home Concentrator;Portable Concentrator;E-Tanks    Sleep Oxygen Prescription Continuous    Liters per minute 2    Home Exercise Oxygen Prescription Continuous    Liters per minute 3    Home Resting Oxygen Prescription Continuous    Liters per minute 2    Compliance with Home Oxygen Use Yes      Initial 6 min Walk   Oxygen Used Pulsed;E-Tanks    Liters per minute 3      Program Oxygen Prescription   Program Oxygen Prescription Continuous;E-Tanks    Liters per minute 3      Intervention   Short Term Goals To learn and exhibit compliance with exercise, home and travel O2 prescription;To learn and understand importance of monitoring SPO2 with pulse oximeter and demonstrate accurate use of the pulse oximeter.;To learn and understand importance of maintaining oxygen saturations>88%;To learn and demonstrate proper pursed lip breathing techniques or other breathing techniques. ;To learn and demonstrate proper use of respiratory medications    Long  Term Goals Exhibits compliance with exercise, home  and travel O2 prescription;Verbalizes importance of monitoring SPO2 with pulse oximeter and return demonstration;Maintenance of O2 saturations>88%;Exhibits proper breathing techniques, such as pursed lip breathing or other method taught during program session;Compliance with respiratory medication;Demonstrates proper use of MDI's              Oxygen Re-Evaluation:  Oxygen Re-Evaluation     Row Name 05/11/23 1516 05/18/23 1345 06/29/23 1507 07/27/23 1422       Program Oxygen Prescription   Program Oxygen Prescription -- Continuous;E-Tanks Continuous;E-Tanks Continuous;E-Tanks    Liters per minute -- -- 3 3    Comments -- -- -- Patient is being compliant with his home oxygen and c-pap.      Home Oxygen   Home Oxygen Device -- Home Concentrator;Portable Concentrator;E-Tanks Home Concentrator;Portable Concentrator;E-Tanks Home Concentrator;Portable Concentrator;E-Tanks    Sleep Oxygen Prescription -- Continuous Continuous Continuous    Liters per minute -- 2 2 2     Home Exercise Oxygen Prescription -- Continuous Continuous Continuous    Liters per minute -- 2.5 2.5 2    Home Resting Oxygen Prescription -- Continuous -- Continuous    Liters per minute -- 2 2 2     Compliance with Home Oxygen Use -- Yes Yes Yes      Goals/Expected Outcomes   Short Term Goals To learn and understand importance of maintaining oxygen saturations>88%;To learn and understand importance of monitoring SPO2 with pulse oximeter and demonstrate accurate use of the pulse oximeter.;To learn and demonstrate proper pursed lip breathing techniques or other breathing techniques.  To learn and understand importance of maintaining oxygen saturations>88%;To learn and understand importance of monitoring SPO2 with pulse oximeter and demonstrate accurate use of the pulse oximeter.;To learn and demonstrate proper pursed lip breathing techniques or other breathing techniques. ;To learn and exhibit compliance with exercise, home and travel O2 prescription;To learn and demonstrate proper use of respiratory medications To learn and understand importance of maintaining oxygen saturations>88%;To learn and understand importance of monitoring SPO2 with pulse oximeter and demonstrate accurate use of the pulse oximeter.;To learn and demonstrate proper pursed lip breathing  techniques or other breathing techniques. ;To learn and exhibit compliance with exercise, home and travel O2 prescription;To learn and demonstrate proper use of respiratory medications To learn and understand importance of maintaining oxygen saturations>88%;To learn and understand importance of monitoring SPO2 with pulse  oximeter and demonstrate accurate use of the pulse oximeter.;To learn and demonstrate proper pursed lip breathing techniques or other breathing techniques. ;To learn and exhibit compliance with exercise, home and travel O2 prescription;To learn and demonstrate proper use of respiratory medications    Long  Term Goals Maintenance of O2 saturations>88%;Verbalizes importance of monitoring SPO2 with pulse oximeter and return demonstration;Exhibits proper breathing techniques, such as pursed lip breathing or other method taught during program session Maintenance of O2 saturations>88%;Verbalizes importance of monitoring SPO2 with pulse oximeter and return demonstration;Exhibits proper breathing techniques, such as pursed lip breathing or other method taught during program session;Exhibits compliance with exercise, home  and travel O2 prescription;Demonstrates proper use of MDI's;Compliance with respiratory medication Maintenance of O2 saturations>88%;Verbalizes importance of monitoring SPO2 with pulse oximeter and return demonstration;Exhibits proper breathing techniques, such as pursed lip breathing or other method taught during program session;Exhibits compliance with exercise, home  and travel O2 prescription;Demonstrates proper use of MDI's;Compliance with respiratory medication Maintenance of O2 saturations>88%;Verbalizes importance of monitoring SPO2 with pulse oximeter and return demonstration;Exhibits proper breathing techniques, such as pursed lip breathing or other method taught during program session;Exhibits compliance with exercise, home  and travel O2 prescription;Demonstrates proper use of  MDI's;Compliance with respiratory medication    Comments Reviewed PLB technique with pt.  Talked about how it works and it's importance in maintaining their exercise saturations. Braxxton is doing well in rehab.  He is good about wearing his oxygen when at home and out and about.  However, he does take it off to go to mailbox and gets winded quickly.  He was encourage to wear it even then to maintain saturations.  He is doing well on his medications  He is good about using his PLB. Somtochukwu is doing well in rehab. His is wearing his oxygen as precriped and not needing to turn it up during exercise. His oxygen is staying WNL when exercising. It will drop to the lower 90s when he increases his speed or level but with PLB he is able to bring it back up. Donte is doing well at home and during rehab with his oxygen. He states his breathing gets a little difficult at home when he is doing stuff, but that it is a lot better than it used to be.    Goals/Expected Outcomes Short: Become more profiecient at using PLB.   Long: Become independent at using PLB. Short: Wear O2 out to mailbox Long; Conitnue to monitor saturations closely. Short: Contineu to work on PLB when exercising  Long; Conitnue to monitor saturations closely. Short: Continue to monitor 02 sat's when exercising and use PLB techniques. Long: Monitor O2 sat's more closely at home.             Oxygen Discharge (Final Oxygen Re-Evaluation):  Oxygen Re-Evaluation - 07/27/23 1422       Program Oxygen Prescription   Program Oxygen Prescription Continuous;E-Tanks    Liters per minute 3    Comments Patient is being compliant with his home oxygen and c-pap.      Home Oxygen   Home Oxygen Device Home Concentrator;Portable Concentrator;E-Tanks    Sleep Oxygen Prescription Continuous    Liters per minute 2    Home Exercise Oxygen Prescription Continuous    Liters per minute 2    Home Resting Oxygen Prescription Continuous    Liters per minute 2     Compliance with Home Oxygen Use Yes      Goals/Expected Outcomes   Short Term Goals  To learn and understand importance of maintaining oxygen saturations>88%;To learn and understand importance of monitoring SPO2 with pulse oximeter and demonstrate accurate use of the pulse oximeter.;To learn and demonstrate proper pursed lip breathing techniques or other breathing techniques. ;To learn and exhibit compliance with exercise, home and travel O2 prescription;To learn and demonstrate proper use of respiratory medications    Long  Term Goals Maintenance of O2 saturations>88%;Verbalizes importance of monitoring SPO2 with pulse oximeter and return demonstration;Exhibits proper breathing techniques, such as pursed lip breathing or other method taught during program session;Exhibits compliance with exercise, home  and travel O2 prescription;Demonstrates proper use of MDI's;Compliance with respiratory medication    Comments Stelios is doing well at home and during rehab with his oxygen. He states his breathing gets a little difficult at home when he is doing stuff, but that it is a lot better than it used to be.    Goals/Expected Outcomes Short: Continue to monitor 02 sat's when exercising and use PLB techniques. Long: Monitor O2 sat's more closely at home.             Initial Exercise Prescription:  Initial Exercise Prescription - 05/03/23 1500       Date of Initial Exercise RX and Referring Provider   Date 05/03/23    Referring Provider [facility]     Oxygen   Oxygen Continuous    Liters 3    Maintain Oxygen Saturation 88% or higher      NuStep   Level 1    SPM 80    Minutes 15    METs 2.1      REL-XR   Level 1    Speed 50    Minutes 15    METs 2.1      Track   Laps 10    Minutes 15    METs 2      Prescription Details   Frequency (times per week) 2    Duration Progress to 30 minutes of continuous aerobic without signs/symptoms of physical distress       Intensity   THRR 40-80% of Max Heartrate 91-131    Ratings of Perceived Exertion 11-13    Perceived Dyspnea 0-4      Progression   Progression Continue to progress workloads to maintain intensity without signs/symptoms of physical distress.      Resistance Training   Training Prescription Yes    Weight 3 lb    Reps 10-15             Perform Capillary Blood Glucose checks as needed.  Exercise Prescription Changes:   Exercise Prescription Changes     Row Name 05/03/23 1500 05/27/23 1400 06/22/23 1500         Response to Exercise   Blood Pressure (Admit) 118/66 140/78 140/78     Blood Pressure (Exercise) 148/84 152/90 124/84     Blood Pressure (Exit) 112/66 140/70 142/78     Heart Rate (Admit) 68 bpm 77 bpm 85 bpm     Heart Rate (Exercise) 106 bpm 97 bpm 97 bpm     Heart Rate (Exit) 80 bpm 76 bpm 82 bpm     Oxygen Saturation (Admit) 93 % 93 % 94 %     Oxygen Saturation (Exercise) 86 % 95 % 95 %     Oxygen Saturation (Exit) 93 % 95 % 94 %     Rating of Perceived Exertion (Exercise) 14 13 13      Perceived  Dyspnea (Exercise) 2 3 2      Symptoms SOB, chest tightness 3/10 -- --     Comments walk test results -- --     Duration -- Continue with 30 min of aerobic exercise without signs/symptoms of physical distress. Continue with 30 min of aerobic exercise without signs/symptoms of physical distress.     Intensity -- THRR unchanged THRR unchanged       Progression   Progression -- Continue to progress workloads to maintain intensity without signs/symptoms of physical distress. Continue to progress workloads to maintain intensity without signs/symptoms of physical distress.       Resistance Training   Training Prescription -- Yes Yes     Weight -- 3 4     Reps -- 10-15 10-15       Oxygen   Oxygen -- Continuous Continuous     Liters -- 3 3       NuStep   Level -- 3 3     SPM -- 78 91     Minutes -- 15 15     METs -- 1.8 2       REL-XR   Level -- 3 3     Speed --  42 43     Minutes -- 15 15     METs -- 2 2.1       Oxygen   Maintain Oxygen Saturation -- 88% or higher 88% or higher              Exercise Comments:   Exercise Comments     Row Name 05/11/23 1515 07/27/23 1403         Exercise Comments First full day of exercise!  Patient was oriented to gym and equipment including functions, settings, policies, and procedures.  Patient's individual exercise prescription and treatment plan were reviewed.  All starting workloads were established based on the results of the 6 minute walk test done at initial orientation visit.  The plan for exercise progression was also introduced and progression will be customized based on patient's performance and goals. Trigg returned today. He had been out with a sore knee as it popped in class when standing the last time he was here at the beginning of the month. He strained the tendons and may have small tear but was told he could exercise with a supportive knee brace on during class.               Exercise Goals and Review:   Exercise Goals     Row Name 05/03/23 1534             Exercise Goals   Increase Physical Activity Yes       Intervention Provide advice, education, support and counseling about physical activity/exercise needs.;Develop an individualized exercise prescription for aerobic and resistive training based on initial evaluation findings, risk stratification, comorbidities and participant's personal goals.       Expected Outcomes Short Term: Attend rehab on a regular basis to increase amount of physical activity.;Long Term: Add in home exercise to make exercise part of routine and to increase amount of physical activity.;Long Term: Exercising regularly at least 3-5 days a week.       Increase Strength and Stamina Yes       Intervention Provide advice, education, support and counseling about physical activity/exercise needs.;Develop an individualized exercise prescription for aerobic and  resistive training based on initial evaluation findings, risk stratification, comorbidities and participant's personal goals.  Expected Outcomes Short Term: Increase workloads from initial exercise prescription for resistance, speed, and METs.;Short Term: Perform resistance training exercises routinely during rehab and add in resistance training at home;Long Term: Improve cardiorespiratory fitness, muscular endurance and strength as measured by increased METs and functional capacity ( )       Able to understand and use rate of perceived exertion (RPE) scale Yes       Intervention Provide education and explanation on how to use RPE scale       Expected Outcomes Short Term: Able to use RPE daily in rehab to express subjective intensity level;Long Term:  Able to use RPE to guide intensity level when exercising independently       Able to understand and use Dyspnea scale Yes       Intervention Provide education and explanation on how to use Dyspnea scale       Expected Outcomes Short Term: Able to use Dyspnea scale daily in rehab to express subjective sense of shortness of breath during exertion;Long Term: Able to use Dyspnea scale to guide intensity level when exercising independently       Knowledge and understanding of Target Heart Rate Range (THRR) Yes       Intervention Provide education and explanation of THRR including how the numbers were predicted and where they are located for reference       Expected Outcomes Short Term: Able to state/look up THRR;Long Term: Able to use THRR to govern intensity when exercising independently;Short Term: Able to use daily as guideline for intensity in rehab       Able to check pulse independently Yes       Intervention Review the importance of being able to check your own pulse for safety during independent exercise;Provide education and demonstration on how to check pulse in carotid and radial arteries.       Expected Outcomes Short Term: Able to explain  why pulse checking is important during independent exercise;Long Term: Able to check pulse independently and accurately       Understanding of Exercise Prescription Yes       Intervention Provide education, explanation, and written materials on patient's individual exercise prescription       Expected Outcomes Short Term: Able to explain program exercise prescription;Long Term: Able to explain home exercise prescription to exercise independently                Exercise Goals Re-Evaluation :  Exercise Goals Re-Evaluation     Row Name 05/11/23 1515 05/18/23 1332 06/29/23 1502 07/27/23 1424       Exercise Goal Re-Evaluation   Exercise Goals Review Able to understand and use rate of perceived exertion (RPE) scale;Knowledge and understanding of Target Heart Rate Range (THRR);Understanding of Exercise Prescription;Able to understand and use Dyspnea scale Increase Physical Activity;Increase Strength and Stamina;Understanding of Exercise Prescription Increase Physical Activity;Increase Strength and Stamina;Understanding of Exercise Prescription Increase Physical Activity;Increase Strength and Stamina;Able to understand and use Dyspnea scale;Knowledge and understanding of Target Heart Rate Range (THRR);Able to understand and use rate of perceived exertion (RPE) scale;Able to check pulse independently;Understanding of Exercise Prescription    Comments Reviewed RPE and dyspnea scale, THR and program prescription with pt today.  Pt voiced understanding and was given a copy of goals to take home. Tionne is off to a good start in rehab.  He is already starting to feel better.  He finds that he is no longer sitting as much.  He does note some dry mouth  and was encouraged to keep drinking water and exercising regularly.  He does have some weights and has already started to use them at home.  We will review home exercise after a few more sessions. Dequandre is doing well in rehab. He is increasing his levels on the XR  and Nustep and is tolerationg the resistance well. He has noticed his SOB getting better when exercising. Delquan states he is doing some exercise at home such as exercising his R leg that was injured, lifting 5 pound weights and walking around outside.    Expected Outcomes Short: Use RPE daily to regulate intensity.  Long: Follow program prescription in THR. Short: Conitnue to move more Long: Continue to attend rehab regularly. Short: Conitnue to move more Long: Continue to attend rehab regularly. Short: Continue to move more. Long: Continue to attend rehab regularly.             Discharge Exercise Prescription (Final Exercise Prescription Changes):  Exercise Prescription Changes - 06/22/23 1500       Response to Exercise   Blood Pressure (Admit) 140/78    Blood Pressure (Exercise) 124/84    Blood Pressure (Exit) 142/78    Heart Rate (Admit) 85 bpm    Heart Rate (Exercise) 97 bpm    Heart Rate (Exit) 82 bpm    Oxygen Saturation (Admit) 94 %    Oxygen Saturation (Exercise) 95 %    Oxygen Saturation (Exit) 94 %    Rating of Perceived Exertion (Exercise) 13    Perceived Dyspnea (Exercise) 2    Duration Continue with 30 min of aerobic exercise without signs/symptoms of physical distress.    Intensity THRR unchanged      Progression   Progression Continue to progress workloads to maintain intensity without signs/symptoms of physical distress.      Resistance Training   Training Prescription Yes    Weight 4    Reps 10-15      Oxygen   Oxygen Continuous    Liters 3      NuStep   Level 3    SPM 91    Minutes 15    METs 2      REL-XR   Level 3    Speed 43    Minutes 15    METs 2.1      Oxygen   Maintain Oxygen Saturation 88% or higher             Nutrition:  Target Goals: Understanding of nutrition guidelines, daily intake of sodium 1500mg , cholesterol 200mg , calories 30% from fat and 7% or less from saturated fats, daily to have 5 or more servings of fruits and  vegetables.  Biometrics:  Pre Biometrics - 05/03/23 1534       Pre Biometrics   Height 5\' 6"  (1.676 m)    Weight 184 lb 8 oz (83.7 kg)    Waist Circumference 40.5 inches    Hip Circumference 40 inches    Waist to Hip Ratio 1.01 %    BMI (Calculated) 29.79    Grip Strength 32.1 kg    Single Leg Stand 4.4 seconds              Nutrition Therapy Plan and Nutrition Goals:  Nutrition Therapy & Goals - 05/03/23 1535       Intervention Plan   Intervention Prescribe, educate and counsel regarding individualized specific dietary modifications aiming towards targeted core components such as weight, hypertension, lipid management, diabetes, heart failure and other comorbidities.;Nutrition  handout(s) given to patient.    Expected Outcomes Short Term Goal: Understand basic principles of dietary content, such as calories, fat, sodium, cholesterol and nutrients.;Long Term Goal: Adherence to prescribed nutrition plan.             Nutrition Assessments:  MEDIFICTS Score Key: >=70 Need to make dietary changes  40-70 Heart Healthy Diet <= 40 Therapeutic Level Cholesterol Diet  Flowsheet Row PULMONARY REHAB COPD ORIENTATION from 05/03/2023 in [facility]ITATION  Picture Your Plate Total Score on Admission 59      Picture Your Plate Scores: <16 Unhealthy dietary pattern with much room for improvement. 41-50 Dietary pattern unlikely to meet recommendations for good health and room for improvement. 51-60 More healthful dietary pattern, with some room for improvement.  >60 Healthy dietary pattern, although there may be some specific behaviors that could be improved.    Nutrition Goals Re-Evaluation:  Nutrition Goals Re-Evaluation     Row Name 05/18/23 1339 06/29/23 1504 07/27/23 1418         Goals   Nutrition Goal Healthy Diet Healthy Diet Healthy Diet     Comment Jerric is working on his diet.  He tries to stick to it as much as possible.  He aims to stay away  from sweets and soda.  He wants to work on losing weight. He is doing well with a variety of fruits and vegetables.  He tries to get good proteins.  He really enjoys baked/broiled chicken with vegetables.  He is not a big breakfast eater and usually gets about two meals a day. Sherrell is continuing to work on his diet and  is working on losing weight. Charan states he is currently eating healthy.     Expected Outcome Short: Conitnue to cut back on sweets Long: Aim for a variety. Short: Conitnue to cut back on sweets Long: Aim for a variety. Short: continue to eat healthy. Long: Aim for a variety in foods.              Nutrition Goals Discharge (Final Nutrition Goals Re-Evaluation):  Nutrition Goals Re-Evaluation - 07/27/23 1418       Goals   Nutrition Goal Healthy Diet    Comment Demetrie states he is currently eating healthy.    Expected Outcome Short: continue to eat healthy. Long: Aim for a variety in foods.             Psychosocial: Target Goals: Acknowledge presence or absence of significant depression and/or stress, maximize coping skills, provide positive support system. Participant is able to verbalize types and ability to use techniques and skills needed for reducing stress and depression.  Initial Review & Psychosocial Screening:  Initial Psych Review & Screening - 04/26/23 1340       Initial Review   Current issues with Current Sleep Concerns;Current Psychotropic Meds;History of Depression;Current Depression   PSTD with visual hallucinations     Family Dynamics   Good Support System? Yes    Comments His 2 daughters are his support.      Barriers   Psychosocial barriers to participate in program The patient should benefit from training in stress management and relaxation.      Screening Interventions   Interventions Encouraged to exercise;To provide support and resources with identified psychosocial needs;Provide feedback about the scores to participant    Expected  Outcomes Short Term goal: Utilizing psychosocial counselor, staff and physician to assist with identification of specific Stressors or current issues interfering with healing process.  Setting desired goal for each stressor or current issue identified.;Long Term Goal: Stressors or current issues are controlled or eliminated.;Short Term goal: Identification and review with participant of any Quality of Life or Depression concerns found by scoring the questionnaire.;Long Term goal: The participant improves quality of Life and PHQ9 Scores as seen by post scores and/or verbalization of changes             Quality of Life Scores:  Scores of 19 and below usually indicate a poorer quality of life in these areas.  A difference of  2-3 points is a clinically meaningful difference.  A difference of 2-3 points in the total score of the Quality of Life Index has been associated with significant improvement in overall quality of life, self-image, physical symptoms, and general health in studies assessing change in quality of life.   PHQ-9: Review Flowsheet       07/06/2023 05/18/2023 05/03/2023  Depression screen PHQ 2/9  Decreased Interest 1 1 2   Down, Depressed, Hopeless 1 0 2  PHQ - 2 Score 2 1 4   Altered sleeping 0 0 2  Tired, decreased energy 1 3 3   Change in appetite 1 0 2  Feeling bad or failure about yourself  0 0 2  Trouble concentrating 1 2 3   Moving slowly or fidgety/restless 1 2 2   Suicidal thoughts 0 0 0  PHQ-9 Score 6 8 18   Difficult doing work/chores Somewhat difficult Somewhat difficult Very difficult   Interpretation of Total Score  Total Score Depression Severity:  1-4 = Minimal depression, 5-9 = Mild depression, 10-14 = Moderate depression, 15-19 = Moderately severe depression, 20-27 = Severe depression   Psychosocial Evaluation and Intervention:  Psychosocial Evaluation - 04/26/23 1343       Psychosocial Evaluation & Interventions   Interventions Stress management  education;Relaxation education;Encouraged to exercise with the program and follow exercise prescription    Comments Patient was referred to PR with COPD from the [place]. He served in [employer] and does report some depression as well as PTSD with occasionally visual and auditory hallucinations which he says is treated with Buspar. He says his daughter was living with him but she has moved out and he now lives alone which has been a diffucult ajustment for him. He has 2 daughters that do support him with his needs. He says he does attend church which helps him socially. He reports trouble staying asleep mainly due to feelings of choking on sinus drainage. He used to work painting houses and he thinks the fumes from the paint and exposure to chemicals while serving in the [employer] damaged his lungs. He is walking some at [facility] but says he can't do a lot. His goals for the program are to improve his strength and stamina and breathing. He has no barriers identified to completing the program.    Expected Outcomes Short Term: start the program and attend consistently. Long Term: meet his personal goals.    Continue Psychosocial Services  Follow up required by staff             Psychosocial Re-Evaluation:  Psychosocial Re-Evaluation     Row Name 05/18/23 1334 06/29/23 1504 07/07/23 1510 07/27/23 1417       Psychosocial Re-Evaluation   Current issues with Current Stress Concerns;Current Depression;Current Psychotropic Meds Current Stress Concerns;Current Depression;Current Psychotropic Meds History of Depression History of Depression    Comments Claudio is doing well in rehab so far.  [facility].  He s already feeling better as he is moving more.  He is sleeping well for most part.  Overall, he is feeling better.  His PHQ has improved by 10 pts already, we will continue to check in with him.  He tries not to dwell on things too much and enjoyed stress  education last week. Mirl is doing well in rehab. He continuse to see his [place]. He is overall feeling better with exercise. Reviewed patient health questionnaire (PHQ-9) with patient for follow up. Previously, patient's score indicated signs/symptoms of depression. Reviewed to see if patient is improving symptom wise while in program. Score declined, when reading the questions to the pt I asked each questions different ways so the pt could understand what was being asked. Pt did not fully understand each question. Pt not having any current stressors, just his history of depression that he keeps taking his meds for.    Expected Outcomes Short: Continue to exercise for mental boost Long: conitnue to work with PTSD Short: Continue to exercise for mental boost Long: conitnue to work with PTSD Short: Continue to work toward an improvement in PHQ9 scores by attending rehab regularly. Long: Continue to improve stress and depression coping skills by talking with staff and attending rehab regularly and work toward a positive mental state. Short: Continue to attend rehab. Long: Continue to improve stress levels.    Interventions -- -- Encouraged to attend Pulmonary Rehabilitation for the exercise Stress management education;Relaxation education;Encouraged to attend Pulmonary Rehabilitation for the exercise    Continue Psychosocial Services  -- -- Follow up required by staff Follow up required by staff             Psychosocial Discharge (Final Psychosocial Re-Evaluation):  Psychosocial Re-Evaluation - 07/27/23 1417       Psychosocial Re-Evaluation   Current issues with History of Depression    Comments Pt not having any current stressors, just his history of depression that he keeps taking his meds for.    Expected Outcomes Short: Continue to attend rehab. Long: Continue to improve stress levels.    Interventions Stress management education;Relaxation education;Encouraged to attend Pulmonary  Rehabilitation for the exercise    Continue Psychosocial Services  Follow up required by staff              Education: Education Goals: Education classes will be provided on a weekly basis, covering required topics. Participant will state understanding/return demonstration of topics presented.  Learning Barriers/Preferences:  Learning Barriers/Preferences - 04/26/23 1327       Learning Barriers/Preferences   Learning Barriers None    Learning Preferences Skilled Demonstration;Audio;Written Material             Education Topics: How Lungs Work and Diseases: - Discuss the anatomy of the lungs and diseases that can affect the lungs, such as COPD. Flowsheet Row PULMONARY REHAB CHRONIC OBSTRUCTIVE PULMONARY DISEASE from 07/01/2023 in [facility]  Date 06/03/23  Educator jh  Instruction Review Code 1- Verbalizes Understanding       Exercise: -Discuss the importance of exercise, FITT principles of exercise, normal and abnormal responses to exercise, and how to exercise safely.   Environmental Irritants: -Discuss types of environmental irritants and how to limit exposure to environmental irritants.   Meds/Inhalers and oxygen: - Discuss respiratory medications, definition of an inhaler and oxygen, and the proper way to use an inhaler and oxygen.   Energy Saving Techniques: - Discuss methods  to conserve energy and decrease shortness of breath when performing activities of daily living.    Bronchial Hygiene / Breathing Techniques: - Discuss breathing mechanics, pursed-lip breathing technique,  proper posture, effective ways to clear airways, and other functional breathing techniques   Cleaning Equipment: - Provides group verbal and written instruction about the health risks of elevated stress, cause of high stress, and healthy ways to reduce stress.   Nutrition I: Fats: - Discuss the types of cholesterol, what cholesterol does to the body, and  how cholesterol levels can be controlled.   Nutrition II: Labels: -Discuss the different components of food labels and how to read food labels.   Respiratory Infections: - Discuss the signs and symptoms of respiratory infections, ways to prevent respiratory infections, and the importance of seeking medical treatment when having a respiratory infection.   Stress I: Signs and Symptoms: - Discuss the causes of stress, how stress may lead to anxiety and depression, and ways to limit stress. Flowsheet Row PULMONARY REHAB CHRONIC OBSTRUCTIVE PULMONARY DISEASE from 07/01/2023 in [place] [facility]  Date 05/13/23  Educator [facility]  Instruction Review Code 1- Verbalizes Understanding       Stress II: Relaxation: -Discuss relaxation techniques to limit stress. Flowsheet Row PULMONARY REHAB CHRONIC OBSTRUCTIVE PULMONARY DISEASE from 07/01/2023 in [place] [facility]  Date 07/01/23  Educator [facility]  Instruction Review Code 1- Verbalizes Understanding       Oxygen for Home/Travel: - Discuss how to prepare for travel when on oxygen and proper ways to transport and store oxygen to ensure safety.   Knowledge Questionnaire Score:  Knowledge Questionnaire Score - 05/03/23 1535       Knowledge Questionnaire Score   Pre Score 11/18             Core Components/Risk Factors/Patient Goals at Admission:  Personal Goals and Risk Factors at Admission - 05/03/23 1535       Core Components/Risk Factors/Patient Goals on Admission    Weight Management Yes;Weight Loss    Intervention Weight Management: Develop a combined nutrition and exercise program designed to reach desired caloric intake, while maintaining appropriate intake of nutrient and fiber, sodium and fats, and appropriate energy expenditure required for the weight goal.;Weight Management: Provide education and appropriate resources to help participant work on and attain dietary goals.;Weight Management/Obesity:  Establish reasonable short term and long term weight goals.    Admit Weight 184 lb 8 oz (83.7 kg)    Goal Weight: Short Term 179 lb (81.2 kg)    Goal Weight: Long Term 165 lb (74.8 kg)    Expected Outcomes Short Term: Continue to assess and modify interventions until short term weight is achieved;Long Term: Adherence to nutrition and physical activity/exercise program aimed toward attainment of established weight goal;Weight Loss: Understanding of general recommendations for a balanced deficit meal plan, which promotes 1-2 lb weight loss per week and includes a negative energy balance of [phone] kcal/d;Understanding recommendations for meals to include 15-35% energy as protein, 25-35% energy from fat, 35-60% energy from carbohydrates, less than 200mg  of dietary cholesterol, 20-35 gm of total fiber daily;Understanding of distribution of calorie intake throughout the day with the consumption of 4-5 meals/snacks    Improve shortness of breath with ADL's Yes    Expected Outcomes Short Term: Improve cardiorespiratory fitness to achieve a reduction of symptoms when performing ADLs;Long Term: Be able to perform more ADLs without symptoms or delay the onset of symptoms    Increase knowledge of respiratory medications and  ability to use respiratory devices properly  Yes    Intervention Provide education and demonstration as needed of appropriate use of medications, inhalers, and oxygen therapy.    Expected Outcomes Short Term: Achieves understanding of medications use. Understands that oxygen is a medication prescribed by physician. Demonstrates appropriate use of inhaler and oxygen therapy.;Long Term: Maintain appropriate use of medications, inhalers, and oxygen therapy.    Intervention Provide education on lifestyle modifcations including regular physical activity/exercise, weight management, moderate sodium restriction and increased consumption of fresh fruit, vegetables, and low fat dairy, alcohol moderation,  and smoking cessation.;Monitor prescription use compliance.    Expected Outcomes Short Term: Continued assessment and intervention until BP is < 140/12mm HG in hypertensive participants. < 130/63mm HG in hypertensive participants with diabetes, heart failure or chronic kidney disease.;Long Term: Maintenance of blood pressure at goal levels.    Lipids Yes    Intervention Provide education and support for participant on nutrition & aerobic/resistive exercise along with prescribed medications to achieve LDL 70mg , HDL >40mg .    Expected Outcomes Short Term: Participant states understanding of desired cholesterol values and is compliant with medications prescribed. Participant is following exercise prescription and nutrition guidelines.;Long Term: Cholesterol controlled with medications as prescribed, with individualized exercise RX and with personalized nutrition plan. Value goals: LDL < 70mg , HDL > 40 mg.             Core Components/Risk Factors/Patient Goals Review:   Goals and Risk Factor Review     Row Name 05/18/23 1341 06/29/23 1505 07/27/23 1419         Core Components/Risk Factors/Patient Goals Review   Personal Goals Review Weight Management/Obesity;Improve shortness of breath with ADL's;Increase knowledge of respiratory medications and ability to use respiratory devices properly.;Hypertension;Lipids Weight Management/Obesity;Improve shortness of breath with ADL's;Increase knowledge of respiratory medications and ability to use respiratory devices properly.;Hypertension;Lipids Weight Management/Obesity;Improve shortness of breath with ADL's;Increase knowledge of respiratory medications and ability to use respiratory devices properly.;Hypertension;Lipids     Review Emonte is doing well in rehab.  He wants to lose more weight is working on continuing to improve his diet to help.  His breathing has gotten better as he is not sitting as much anymore.  His blood pressures are doing well and he  does check them at home.  He keeps a record to show his doctors regularly.  He was at [facility] yesterday for a general f/u and is doing well.  His breathing is doing well, but has some rougher days.  He is doing well with his meds.  He is good about wearing his oxygen when out and about but not out to [employer].  He was encouraged to wear it regularly to maintain saturations. Jeromie continues to do well in rehab. He is watching what he eats so he is able to lose weight and improve his diet. His blood pressure continues to do well in rehab and his oxygen is staying WNL during exercise. His is working on his PLB when in rehab=. Kathan is coming back from being out of rehab due to a knee injury. He is taking all of his medications as prescribed, especially his respiratory medications. He is eager to continue working in rehab.     Expected Outcomes Short: Continue to work on weight loss.  Long: Continue to montior risk factors. Short: Continue to work on weight loss.  Long: Continue to montior risk factors. Short: Continue to work on weight loss and building strength.  Long: Continue to monitor risk  factors.              Core Components/Risk Factors/Patient Goals at Discharge (Final Review):   Goals and Risk Factor Review - 07/27/23 1419       Core Components/Risk Factors/Patient Goals Review   Personal Goals Review Weight Management/Obesity;Improve shortness of breath with ADL's;Increase knowledge of respiratory medications and ability to use respiratory devices properly.;Hypertension;Lipids    Review Dontreal is coming back from being out of rehab due to a knee injury. He is taking all of his medications as prescribed, especially his respiratory medications. He is eager to continue working in rehab.    Expected Outcomes Short: Continue to work on weight loss and building strength.  Long: Continue to monitor risk factors.             ITP Comments:  ITP Comments     Row Name 05/03/23 1530 05/04/23 1436  05/11/23 1515 06/02/23 1138 06/30/23 1349   ITP Comments Patient attend orientation today.  Patient is attending Pulmonary Rehabilitation Program.  Documentation for diagnosis can be found in [place] notes in [facility] for today's appointment.  Reviewed medical chart, RPE/RPD, gym safety, and program guidelines.  Patient was fitted to equipment they will be using during rehab.  Patient is scheduled to start exercise on Tues 05/11/23 at 1330.   Initial ITP created and sent for review and signature by Dr. Erick Blinks, Medical Director for Pulmonary Rehabilitation Program. 30 day review completed. ITP sent to Dr.Jehanzeb Memon, Medical Director of  Pulmonary Rehab. Continue with ITP unless changes are made by physician.  Only attended orientation yesterday. First full day of exercise!  Patient was oriented to gym and equipment including functions, settings, policies, and procedures.  Patient's individual exercise prescription and treatment plan were reviewed.  All starting workloads were established based on the results of the 6 minute walk test done at initial orientation visit.  The plan for exercise progression was also introduced and progression will be customized based on patient's performance and goals. 30 day review completed. ITP sent to Dr.Jehanzeb Memon, Medical Director of  Pulmonary Rehab. Continue with ITP unless changes are made by physician. 30 day review completed. ITP sent to Dr.Jehanzeb Memon, Medical Director of  Pulmonary Rehab. Continue with ITP unless changes are made by physician.    Row Name 07/27/23 1401 07/28/23 1151         ITP Comments Eliah returned today.  He had been out with a sore knee as it popped in class when standing the last time he was here at the beginning of the month.  He strained the tendons and may  have small tear but was told he could exercise with a supportive knee brace on during class. 30 day review completed. ITP sent to Dr.Jehanzeb Memon, Medical Director of  Pulmonary  Rehab. Continue with ITP unless changes are made by physician.               Comments: 30 day review

## 2023-07-29 ENCOUNTER — Encounter (HOSPITAL_COMMUNITY)
Admission: RE | Admit: 2023-07-29 | Discharge: 2023-07-29 | Disposition: A | Discharge status: Home / Self Care | Payer: No Typology Code available for payment source | Source: Ambulatory Visit

## 2023-07-29 DIAGNOSIS — J449 Chronic obstructive pulmonary disease, unspecified: Secondary | ICD-10-CM

## 2023-07-29 NOTE — Progress Notes (Signed)
 Daily Session Note  Patient Details  Name: Chad Hensley MRN: [id number] Date of Birth: February 05, 1951 Referring Provider:   Flowsheet Row PULMONARY REHAB COPD ORIENTATION from 05/03/2023 in [facility] CARDIAC REHABILITATION  Referring Provider [facility]       Encounter Date: 07/29/2023  Check In:  Session Check In - 07/29/23 1330       Check-In   Supervising physician immediately available to respond to emergencies See telemetry face sheet for immediately available MD    Location [facility]    Staff Present Ross Ludwig, BS, Exercise Physiologist;Hillary Leonidas Romberg BSN, RN    Virtual Visit No    Medication changes reported     No    Fall or balance concerns reported    No    Tobacco Cessation No Change    Warm-up and Cool-down Performed on first and last piece of equipment    Resistance Training Performed Yes    VAD Patient? No    PAD/SET Patient? No      Pain Assessment   Currently in Pain? No/denies    Pain Score 0-No pain    Multiple Pain Sites No             Capillary Blood Glucose: No results found for this or any previous visit (from the past 24 hours).    Social History   Tobacco Use  Smoking Status Former   Current packs/day: 0.00   Average packs/day: 0.5 packs/day for 2.0 years (1.0 ttl pk-yrs)   Types: Cigarettes   Start date: 04/03/1969   Quit date: 05/04/1970   Years since quitting: 53.2  Smokeless Tobacco Not on file    Goals Met:  Independence with exercise equipment Exercise tolerated well No report of concerns or symptoms today Strength training completed today  Goals Unmet:  Not Applicable  Comments: Pt able to follow exercise prescription today without complaint.  Will continue to monitor for progression.

## 2023-08-03 ENCOUNTER — Encounter (HOSPITAL_COMMUNITY)
Admission: RE | Admit: 2023-08-03 | Discharge: 2023-08-03 | Disposition: A | Discharge status: Home / Self Care | Payer: No Typology Code available for payment source | Source: Ambulatory Visit

## 2023-08-03 DIAGNOSIS — J449 Chronic obstructive pulmonary disease, unspecified: Secondary | ICD-10-CM | POA: Insufficient documentation

## 2023-08-03 NOTE — Progress Notes (Signed)
 Daily Session Note  Patient Details  Name: Chad Hensley MRN: [id number] Date of Birth: [birth year] Referring Provider:   Flowsheet Row PULMONARY REHAB COPD ORIENTATION from 05/03/2023 in [facility] CARDIAC REHABILITATION  Referring Provider [facility]       Encounter Date: 08/03/2023  Check In:  Session Check In - 08/03/23 1337       Check-In   Supervising physician immediately available to respond to emergencies See telemetry face sheet for immediately available MD    Location [facility]    Staff Present Ross Ludwig, BS, Exercise Physiologist;Brooke Elwyn Reach, RN;Jessica Dalton, MA, RCEP, CCRP, CCET;Elsbeth Yearick Roseanne Reno, BSN, RN, WTA-C    Virtual Visit No    Medication changes reported     No    Fall or balance concerns reported    No    Tobacco Cessation No Change    Warm-up and Cool-down Performed on first and last piece of equipment    Resistance Training Performed Yes    VAD Patient? No    PAD/SET Patient? No      Pain Assessment   Currently in Pain? No/denies             Capillary Blood Glucose: No results found for this or any previous visit (from the past 24 hours).    Social History   Tobacco Use  Smoking Status Former   Current packs/day: 0.00   Average packs/day: 0.5 packs/day for 2.0 years (1.0 ttl pk-yrs)   Types: Cigarettes   Start date: 04/03/1969   Quit date: 05/04/1970   Years since quitting: 53.2  Smokeless Tobacco Not on file    Goals Met:  Proper associated with RPD/PD & O2 Sat Independence with exercise equipment Improved SOB with ADL's Using PLB without cueing & demonstrates good technique No report of concerns or symptoms today Strength training completed today  Goals Unmet:  Not Applicable  Comments: Pt able to follow exercise prescription today without complaint.  Will continue to monitor for progression.

## 2023-08-05 ENCOUNTER — Encounter (HOSPITAL_COMMUNITY)
Admission: RE | Admit: 2023-08-05 | Discharge: 2023-08-05 | Disposition: A | Discharge status: Home / Self Care | Payer: No Typology Code available for payment source | Source: Ambulatory Visit

## 2023-08-05 DIAGNOSIS — J449 Chronic obstructive pulmonary disease, unspecified: Secondary | ICD-10-CM

## 2023-08-05 NOTE — Progress Notes (Signed)
 Daily Session Note  Patient Details  Name: Chad Hensley MRN: [id number] Date of Birth: 1950/09/03 Referring Provider:   Flowsheet Row PULMONARY REHAB COPD ORIENTATION from 05/03/2023 in [facility] CARDIAC REHABILITATION  Referring Provider [facility]       Encounter Date: 08/05/2023  Check In:  Session Check In - 08/05/23 1315       Check-In   Supervising physician immediately available to respond to emergencies See telemetry face sheet for immediately available MD    Location [facility]    Staff Present Avanell Shackleton BSN, RN;Heather Fredric Mare, [place], Exercise Physiologist;Tina Jake Shark, RN    Virtual Visit No    Medication changes reported     No    Fall or balance concerns reported    No    Tobacco Cessation No Change    Warm-up and Cool-down Performed on first and last piece of equipment    Resistance Training Performed Yes    VAD Patient? No    PAD/SET Patient? No      Pain Assessment   Currently in Pain? No/denies    Pain Score 0-No pain    Multiple Pain Sites No             Capillary Blood Glucose: No results found for this or any previous visit (from the past 24 hours).    Social History   Tobacco Use  Smoking Status Former   Current packs/day: 0.00   Average packs/day: 0.5 packs/day for 2.0 years (1.0 ttl pk-yrs)   Types: Cigarettes   Start date: 04/03/1969   Quit date: 05/04/1970   Years since quitting: 53.2  Smokeless Tobacco Not on file    Goals Met:  Proper associated with RPD/PD & O2 Sat Independence with exercise equipment Using PLB without cueing & demonstrates good technique Exercise tolerated well Queuing for purse lip breathing No report of concerns or symptoms today Strength training completed today  Goals Unmet:  Not Applicable  Comments: .Pt able to follow exercise prescription today without complaint.  Will continue to monitor for progression.

## 2023-08-10 ENCOUNTER — Encounter (HOSPITAL_COMMUNITY)
Admission: RE | Admit: 2023-08-10 | Discharge: 2023-08-10 | Disposition: A | Discharge status: Home / Self Care | Payer: No Typology Code available for payment source | Source: Ambulatory Visit

## 2023-08-10 DIAGNOSIS — J449 Chronic obstructive pulmonary disease, unspecified: Secondary | ICD-10-CM | POA: Diagnosis not present

## 2023-08-10 NOTE — Progress Notes (Signed)
 Daily Session Note  Patient Details  Name: Chad Hensley MRN: [id number] Date of Birth: 26-Sep-1950 Referring Provider:   Flowsheet Row PULMONARY REHAB COPD ORIENTATION from 05/03/2023 in [facility] CARDIAC REHABILITATION  Referring Provider [facility]       Encounter Date: 08/10/2023  Check In:  Session Check In - 08/10/23 1349       Check-In   Supervising physician immediately available to respond to emergencies See telemetry face sheet for immediately available MD    Location [facility]    Staff Present Desiree Lucy, BSN, RN, Sherlyn Hay, MA, RCEP, CCRP, CCET    Virtual Visit No    Medication changes reported     No    Fall or balance concerns reported    No    Tobacco Cessation No Change    Warm-up and Cool-down Performed on first and last piece of equipment    Resistance Training Performed Yes    VAD Patient? No    PAD/SET Patient? No      Pain Assessment   Currently in Pain? No/denies             Capillary Blood Glucose: No results found for this or any previous visit (from the past 24 hours).    Social History   Tobacco Use  Smoking Status Former   Current packs/day: 0.00   Average packs/day: 0.5 packs/day for 2.0 years (1.0 ttl pk-yrs)   Types: Cigarettes   Start date: 04/03/1969   Quit date: 05/04/1970   Years since quitting: 53.3  Smokeless Tobacco Not on file    Goals Met:  Proper associated with RPD/PD & O2 Sat Independence with exercise equipment Improved SOB with ADL's Using PLB without cueing & demonstrates good technique Exercise tolerated well No report of concerns or symptoms today Strength training completed today  Goals Unmet:  Not Applicable  Comments: Pt able to follow exercise prescription today without complaint.  Will continue to monitor for progression.

## 2023-08-12 ENCOUNTER — Encounter (HOSPITAL_COMMUNITY)
Admission: RE | Admit: 2023-08-12 | Discharge: 2023-08-12 | Disposition: A | Discharge status: Home / Self Care | Payer: No Typology Code available for payment source | Source: Ambulatory Visit

## 2023-08-12 DIAGNOSIS — J449 Chronic obstructive pulmonary disease, unspecified: Secondary | ICD-10-CM | POA: Diagnosis not present

## 2023-08-12 NOTE — Progress Notes (Signed)
 Daily Session Note  Patient Details  Name: Chad Hensley MRN: [id number] Date of Birth: 1951/03/12 Referring Provider:   Flowsheet Row PULMONARY REHAB COPD ORIENTATION from 05/03/2023 in [facility] CARDIAC REHABILITATION  Referring Provider [facility]       Encounter Date: 08/12/2023  Check In:  Session Check In - 08/12/23 1315       Check-In   Supervising physician immediately available to respond to emergencies See telemetry face sheet for immediately available MD    Location [facility]    Staff Present Avanell Shackleton BSN, RN;Heather Fredric Mare, BS, Exercise Physiologist;Jessica Chama, MA, RCEP, CCRP, Dow Adolph, RN, BSN    Virtual Visit No    Medication changes reported     No    Fall or balance concerns reported    No    Tobacco Cessation No Change    Warm-up and Cool-down Performed on first and last piece of equipment    Resistance Training Performed Yes    VAD Patient? No    PAD/SET Patient? No      Pain Assessment   Currently in Pain? No/denies    Pain Score 0-No pain    Multiple Pain Sites No             Capillary Blood Glucose: No results found for this or any previous visit (from the past 24 hours).    Social History   Tobacco Use  Smoking Status Former   Current packs/day: 0.00   Average packs/day: 0.5 packs/day for 2.0 years (1.0 ttl pk-yrs)   Types: Cigarettes   Start date: 04/03/1969   Quit date: 05/04/1970   Years since quitting: 53.3  Smokeless Tobacco Not on file    Goals Met:  Proper associated with RPD/PD & O2 Sat Independence with exercise equipment Using PLB without cueing & demonstrates good technique Exercise tolerated well Queuing for purse lip breathing No report of concerns or symptoms today Strength training completed today  Goals Unmet:  Not Applicable  Comments: .Pt able to follow exercise prescription today without complaint.  Will continue to monitor for  progression.

## 2023-08-17 ENCOUNTER — Encounter (HOSPITAL_COMMUNITY)
Admission: RE | Admit: 2023-08-17 | Discharge: 2023-08-17 | Disposition: A | Discharge status: Home / Self Care | Payer: No Typology Code available for payment source | Source: Ambulatory Visit

## 2023-08-17 DIAGNOSIS — J449 Chronic obstructive pulmonary disease, unspecified: Secondary | ICD-10-CM | POA: Diagnosis not present

## 2023-08-17 NOTE — Progress Notes (Signed)
 Daily Session Note  Patient Details  Name: Chad Hensley MRN: [id number] Date of Birth: Sep 11, 1950 Referring Provider:   Flowsheet Row PULMONARY REHAB COPD ORIENTATION from 05/03/2023 in [facility] CARDIAC REHABILITATION  Referring Provider [facility]       Encounter Date: 08/17/2023  Check In:  Session Check In - 08/17/23 1330       Check-In   Supervising physician immediately available to respond to emergencies See telemetry face sheet for immediately available MD    Location [facility]    Staff Present Balinda Level, BS, RRT, CPFT;Heather Alec Huntington, Exercise Physiologist;Brittany Annette Barters, BSN, RN, WTA-C;Riley Papin, RN    Virtual Visit No    Medication changes reported     No    Fall or balance concerns reported    No    Warm-up and Cool-down Performed on first and last piece of equipment    Resistance Training Performed Yes    VAD Patient? No    PAD/SET Patient? No      Pain Assessment   Currently in Pain? No/denies    Pain Score 0-No pain    Multiple Pain Sites No             Capillary Blood Glucose: No results found for this or any previous visit (from the past 24 hours).    Social History   Tobacco Use  Smoking Status Former   Current packs/day: 0.00   Average packs/day: 0.5 packs/day for 2.0 years (1.0 ttl pk-yrs)   Types: Cigarettes   Start date: 04/03/1969   Quit date: 05/04/1970   Years since quitting: 53.3  Smokeless Tobacco Not on file    Goals Met:  Proper associated with RPD/PD & O2 Sat Independence with exercise equipment Improved SOB with ADL's Exercise tolerated well No report of concerns or symptoms today Strength training completed today  Goals Unmet:  Not Applicable  Comments: Pt able to follow exercise prescription today without complaint.  Will continue to monitor for progression.

## 2023-08-19 ENCOUNTER — Encounter (HOSPITAL_COMMUNITY)
Admission: RE | Admit: 2023-08-19 | Discharge: 2023-08-19 | Disposition: A | Discharge status: Home / Self Care | Payer: No Typology Code available for payment source | Source: Ambulatory Visit

## 2023-08-19 DIAGNOSIS — J449 Chronic obstructive pulmonary disease, unspecified: Secondary | ICD-10-CM | POA: Diagnosis not present

## 2023-08-19 NOTE — Progress Notes (Signed)
 Daily Session Note  Patient Details  Name: Chad Hensley MRN: [id number] Date of Birth: May 31, 1950 Referring Provider:   Flowsheet Row PULMONARY REHAB COPD ORIENTATION from 05/03/2023 in [facility] CARDIAC REHABILITATION  Referring Provider [facility]       Encounter Date: 08/19/2023  Check In:  Session Check In - 08/19/23 1330       Check-In   Supervising physician immediately available to respond to emergencies See telemetry face sheet for immediately available MD    Location [facility]    Staff Present Almeda Aris, RN;Tonya Wantz Alec Huntington, Exercise Physiologist;Hillary Troutman BSN, RN;Other    Virtual Visit No    Medication changes reported     No    Fall or balance concerns reported    No    Tobacco Cessation No Change    Warm-up and Cool-down Performed on first and last piece of equipment    Resistance Training Performed Yes    VAD Patient? No    PAD/SET Patient? No      Pain Assessment   Currently in Pain? No/denies    Pain Score 0-No pain    Multiple Pain Sites No             Capillary Blood Glucose: No results found for this or any previous visit (from the past 24 hours).    Social History   Tobacco Use  Smoking Status Former   Current packs/day: 0.00   Average packs/day: 0.5 packs/day for 2.0 years (1.0 ttl pk-yrs)   Types: Cigarettes   Start date: 04/03/1969   Quit date: 05/04/1970   Years since quitting: 53.3  Smokeless Tobacco Not on file    Goals Met:  Independence with exercise equipment Exercise tolerated well No report of concerns or symptoms today Strength training completed today  Goals Unmet:  Not Applicable  Comments: Pt able to follow exercise prescription today without complaint.  Will continue to monitor for progression.

## 2023-08-24 ENCOUNTER — Encounter (HOSPITAL_COMMUNITY)
Admission: RE | Admit: 2023-08-24 | Discharge: 2023-08-24 | Disposition: A | Discharge status: Home / Self Care | Payer: No Typology Code available for payment source | Source: Ambulatory Visit

## 2023-08-24 DIAGNOSIS — J449 Chronic obstructive pulmonary disease, unspecified: Secondary | ICD-10-CM | POA: Diagnosis not present

## 2023-08-24 NOTE — Progress Notes (Signed)
 Daily Session Note  Patient Details  Name: Chad Hensley MRN: [id number] Date of Birth: Nov 05, 1950 Referring Provider:   Flowsheet Row PULMONARY REHAB COPD ORIENTATION from 05/03/2023 in [facility] CARDIAC REHABILITATION  Referring Provider [facility]       Encounter Date: 08/24/2023  Check In:  Session Check In - 08/24/23 1330       Check-In   Supervising physician immediately available to respond to emergencies See telemetry face sheet for immediately available MD    Location [facility]    Staff Present Clotilda Danish, BS, Exercise Physiologist;Brittany Annette Barters, BSN, RN, WTA-C;Jeffren Dombek, RN;Jessica [place], MA, RCEP, CCRP, CCET    Virtual Visit No    Medication changes reported     No    Fall or balance concerns reported    No    Warm-up and Cool-down Performed on first and last piece of equipment    Resistance Training Performed Yes    VAD Patient? No    PAD/SET Patient? No      Pain Assessment   Currently in Pain? No/denies    Pain Score 0-No pain    Multiple Pain Sites No             Capillary Blood Glucose: No results found for this or any previous visit (from the past 24 hours).    Social History   Tobacco Use  Smoking Status Former   Current packs/day: 0.00   Average packs/day: 0.5 packs/day for 2.0 years (1.0 ttl pk-yrs)   Types: Cigarettes   Start date: 04/03/1969   Quit date: 05/04/1970   Years since quitting: 53.3  Smokeless Tobacco Not on file    Goals Met:  Proper associated with RPD/PD & O2 Sat Independence with exercise equipment Using PLB without cueing & demonstrates good technique Exercise tolerated well No report of concerns or symptoms today Strength training completed today  Goals Unmet:  Not Applicable  Comments: Pt able to follow exercise prescription today without complaint.  Will continue to monitor for progression.

## 2023-08-25 ENCOUNTER — Encounter (HOSPITAL_COMMUNITY): Payer: Self-pay | Admitting: *Deleted

## 2023-08-25 DIAGNOSIS — J449 Chronic obstructive pulmonary disease, unspecified: Secondary | ICD-10-CM

## 2023-08-25 NOTE — Progress Notes (Signed)
 Pulmonary Individual Treatment Plan  Patient Details  Name: Chad Hensley MRN: [id number] Date of Birth: 05/27/1950 Referring Provider:   Flowsheet Row PULMONARY REHAB COPD ORIENTATION from 05/03/2023 in [facility] CARDIAC REHABILITATION  Referring Provider Briones, [facility]]       Initial Encounter Date:  Flowsheet Row PULMONARY REHAB COPD ORIENTATION from 05/03/2023 in [facility] [place] CARDIAC REHABILITATION  Date 05/03/23       Visit Diagnosis: Chronic obstructive pulmonary disease, unspecified COPD type (HCC)  Patient's Home Medications on Admission:   Current Outpatient Medications:    acetaminophen  (TYLENOL ) 500 MG tablet, Take 2 tablets (1,000 mg total) by mouth every 8 (eight) hours as needed for headache., Disp: , Rfl:    ALBUTEROL  IN, Inhale into the lungs as needed., Disp: , Rfl:    amLODipine (NORVASC) 5 MG tablet, Take 5 mg by mouth daily. If BP over 130/85, Disp: , Rfl:    ascorbic acid (VITAMIN C) 500 MG tablet, Take 500 mg by mouth daily., Disp: , Rfl:    aspirin  EC 81 MG tablet, Take 1 tablet (81 mg total) by mouth daily. Swallow whole., Disp: 30 tablet, Rfl: 12   atorvastatin  (LIPITOR) 20 MG tablet, Take 20 mg by mouth daily., Disp: , Rfl:    buPROPion  (WELLBUTRIN  SR) 150 MG 12 hr tablet, Take 150 mg by mouth 2 (two) times daily., Disp: , Rfl:    carvedilol  (COREG ) 12.5 MG tablet, Take 1 tablet (12.5 mg total) by mouth 2 (two) times daily with a meal., Disp: 60 tablet, Rfl: 2   cetirizine (ZYRTEC) 10 MG tablet, Take 10 mg by mouth daily., Disp: , Rfl:    Cholecalciferol 2000 UNITS TABS, Take by mouth daily., Disp: , Rfl:    doxycycline  (VIBRA -TABS) 100 MG tablet, Take 1 tablet (100 mg total) by mouth every 12 (twelve) hours., Disp: 8 tablet, Rfl: 0   famotidine  (PEPCID ) 10 MG tablet, Take 2 tablets (20 mg total) by mouth at bedtime., Disp: , Rfl:    fluticasone-salmeterol (WIXELA INHUB) 250-50 MCG/ACT AEPB, Inhale 1 puff into the lungs in the morning and at  bedtime., Disp: , Rfl:    furosemide  (LASIX ) 20 MG tablet, Take 1 tablet (20 mg total) by mouth daily., Disp: 30 tablet, Rfl: 3   guaiFENesin  (MUCINEX ) 600 MG 12 hr tablet, Take 1 tablet (600 mg total) by mouth 2 (two) times daily., Disp: 10 tablet, Rfl: 0   hydrOXYzine (ATARAX) 10 MG tablet, Take 10 mg by mouth 3 (three) times daily as needed., Disp: , Rfl:    isosorbide  mononitrate (IMDUR ) 30 MG 24 hr tablet, Take 1 tablet (30 mg total) by mouth daily., Disp: 30 tablet, Rfl: 2   losartan  (COZAAR ) 50 MG tablet, Take 1 tablet (50 mg total) by mouth daily., Disp: 30 tablet, Rfl: 2   Omega-3 Fatty Acids (FISH OIL PO), Take 1 capsule by mouth 2 (two) times daily., Disp: , Rfl:    omeprazole (PRILOSEC) 20 MG capsule, Take 20 mg by mouth daily., Disp: , Rfl:    prazosin (MINIPRESS) 2 MG capsule, Take 2 mg by mouth at bedtime., Disp: , Rfl:    predniSONE  (DELTASONE ) 10 MG tablet, Prednisone  40 mg po daily x 1 day then Prednisone  30 mg po daily x 1 day then Prednisone  20 mg po daily x 1 day then Prednisone  10 mg daily x 1 day then stop..., Disp: 10 tablet, Rfl: 0   Tiotropium Bromide-Olodaterol (STIOLTO RESPIMAT) 2.5-2.5 MCG/ACT AERS, Inhale into the lungs  daily., Disp: , Rfl:    vitamin B-12 (CYANOCOBALAMIN) 50 MCG tablet, Take 50 mcg by mouth daily., Disp: , Rfl:   Past Medical History: Past Medical History:  Diagnosis Date   COPD (chronic obstructive pulmonary disease) (HCC)    Hypertension    PTSD (post-traumatic stress disorder) 05/04/1976    Tobacco Use: Social History   Tobacco Use  Smoking Status Former   Current packs/day: 0.00   Average packs/day: 0.5 packs/day for 2.0 years (1.0 ttl pk-yrs)   Types: Cigarettes   Start date: 04/03/1969   Quit date: 05/04/1970   Years since quitting: 53.3  Smokeless Tobacco Not on file    Labs: Review Flowsheet       Latest Ref Rng & Units 05/01/2008 02/15/2022 02/16/2022  Labs for ITP Cardiac and Pulmonary Rehab  Cholestrol 0 - 200 mg/dL - -  409   LDL (calc) 0 - 99 mg/dL - - 73   HDL-C >81 mg/dL - - 33   Trlycerides <191 mg/dL - - 478   Hemoglobin G9F 4.8 - 5.6 % - 5.4  -  TCO2 0 - 100 mmol/L 25  - -    Capillary Blood Glucose: No results found for: "GLUCAP"   Pulmonary Assessment Scores:  Pulmonary Assessment Scores     Row Name 05/03/23 1538         ADL UCSD   ADL Phase Entry     SOB Score total 100     Rest 2     Walk 3     Stairs 5     Bath 4     Dress 3     Shop 3       CAT Score   CAT Score 21       mMRC Score   mMRC Score 3             UCSD: Self-administered rating of dyspnea associated with activities of daily living (ADLs) 6-point scale (0 = "not at all" to 5 = "maximal or unable to do because of breathlessness")  Scoring Scores range from 0 to 120.  Minimally important difference is 5 units  CAT: CAT can identify the health impairment of COPD patients and is better correlated with disease progression.  CAT has a scoring range of zero to 40. The CAT score is classified into four groups of low (less than 10), medium (10 - 20), high (21-30) and very high (31-40) based on the impact level of disease on health status. A CAT score over 10 suggests significant symptoms.  A worsening CAT score could be explained by an exacerbation, poor medication adherence, poor inhaler technique, or progression of COPD or comorbid conditions.  CAT MCID is 2 points  mMRC: mMRC (Modified Medical Research Council) Dyspnea Scale is used to assess the degree of baseline functional disability in patients of respiratory disease due to dyspnea. No minimal important difference is established. A decrease in score of 1 point or greater is considered a positive change.   Pulmonary Function Assessment:  Pulmonary Function Assessment - 05/03/23 1537       Initial Spirometry Results   FVC% 82.5 %    FEV1% 39.4 %    FEV1/FVC Ratio 36    Comments 12/15/2022      Post Bronchodilator Spirometry Results   FVC% 102.5 %     FEV1% 40.2 %    FEV1/FVC Ratio 30    Comments 12/15/22      Breath   Shortness of Breath  Yes;Fear of Shortness of Breath;Limiting activity;Panic with Shortness of Breath             Exercise Target Goals: Exercise Program Goal: Individual exercise prescription set using results from initial 6 min walk test and THRR while considering  patient's activity barriers and safety.   Exercise Prescription Goal: Initial exercise prescription builds to 30-45 minutes a day of aerobic activity, 2-3 days per week.  Home exercise guidelines will be given to patient during program as part of exercise prescription that the participant will acknowledge.  Activity Barriers & Risk Stratification:  Activity Barriers & Cardiac Risk Stratification - 04/26/23 1323       Activity Barriers & Cardiac Risk Stratification   Activity Barriers Deconditioning;Shortness of Breath;Joint Problems   Left shoulder joint replaced 3 years ago.   Cardiac Risk Stratification Low             6 Minute Walk:  6 Minute Walk     Row Name 05/03/23 1531         6 Minute Walk   Phase Initial     Distance 940 feet     Walk Time 6 minutes     # of Rest Breaks 0     MPH 1.78     METS 2.15     RPE 14     Perceived Dyspnea  2     VO2 Peak 7.54     Symptoms Yes (comment)     Comments SOB, chest tightness 3/10, carrying small e-cylinder     Resting HR 68 bpm     Resting BP 118/66     Resting Oxygen Saturation  93 %     Exercise Oxygen Saturation  during 6 min walk 86 %     Max Ex. HR 106 bpm     Max Ex. BP 148/84     2 Minute Post BP 122/64       Interval HR   1 Minute HR 92     2 Minute HR 106     3 Minute HR 100     4 Minute HR 98     5 Minute HR 102     6 Minute HR 99     2 Minute Post HR 81     Interval Heart Rate? Yes       Interval Oxygen   Interval Oxygen? Yes     Baseline Oxygen Saturation % 93 %  1L NCC     1 Minute Oxygen Saturation % 90 %     1 Minute Liters of Oxygen 3 L  NCP     2  Minute Oxygen Saturation % 87 %     2 Minute Liters of Oxygen 3 L     3 Minute Oxygen Saturation % 86 %     3 Minute Liters of Oxygen 3 L     4 Minute Oxygen Saturation % 86 %     4 Minute Liters of Oxygen 3 L     5 Minute Oxygen Saturation % 87 %     5 Minute Liters of Oxygen 3 L     6 Minute Oxygen Saturation % 86 %     6 Minute Liters of Oxygen 3 L     2 Minute Post Oxygen Saturation % 94 %     2 Minute Post Liters of Oxygen 3 L              Oxygen Initial Assessment:  Oxygen  Initial Assessment - 05/03/23 1536       Home Oxygen   Home Oxygen Device Home Concentrator;Portable Concentrator;E-Tanks    Sleep Oxygen Prescription Continuous    Liters per minute 2    Home Exercise Oxygen Prescription Continuous    Liters per minute 3    Home Resting Oxygen Prescription Continuous    Liters per minute 2    Compliance with Home Oxygen Use Yes      Initial 6 min Walk   Oxygen Used Pulsed;E-Tanks    Liters per minute 3      Program Oxygen Prescription   Program Oxygen Prescription Continuous;E-Tanks    Liters per minute 3      Intervention   Short Term Goals To learn and exhibit compliance with exercise, home and travel O2 prescription;To learn and understand importance of monitoring SPO2 with pulse oximeter and demonstrate accurate use of the pulse oximeter.;To learn and understand importance of maintaining oxygen saturations>88%;To learn and demonstrate proper pursed lip breathing techniques or other breathing techniques. ;To learn and demonstrate proper use of respiratory medications    Long  Term Goals Exhibits compliance with exercise, home  and travel O2 prescription;Verbalizes importance of monitoring SPO2 with pulse oximeter and return demonstration;Maintenance of O2 saturations>88%;Exhibits proper breathing techniques, such as pursed lip breathing or other method taught during program session;Compliance with respiratory medication;Demonstrates proper use of MDI's              Oxygen Re-Evaluation:  Oxygen Re-Evaluation     Row Name 05/11/23 1516 05/18/23 1345 06/29/23 1507 07/27/23 1422 08/03/23 1408     Program Oxygen Prescription   Program Oxygen Prescription -- Continuous;E-Tanks Continuous;E-Tanks Continuous;E-Tanks Continuous;E-Tanks   Liters per minute -- -- 3 3 3    Comments -- -- -- Patient is being compliant with his home oxygen and c-pap. --     Home Oxygen   Home Oxygen Device -- Home Concentrator;Portable Concentrator;E-Tanks Home Concentrator;Portable Concentrator;E-Tanks Home Concentrator;Portable Concentrator;E-Tanks Home Concentrator;Portable Concentrator;E-Tanks   Sleep Oxygen Prescription -- Continuous Continuous Continuous Continuous   Liters per minute -- 2 2 2 2    Home Exercise Oxygen Prescription -- Continuous Continuous Continuous Continuous   Liters per minute -- 2.5 2.5 2 2    Home Resting Oxygen Prescription -- Continuous -- Continuous Continuous   Liters per minute -- 2 2 2 2    Compliance with Home Oxygen Use -- Yes Yes Yes Yes     Goals/Expected Outcomes   Short Term Goals To learn and understand importance of maintaining oxygen saturations>88%;To learn and understand importance of monitoring SPO2 with pulse oximeter and demonstrate accurate use of the pulse oximeter.;To learn and demonstrate proper pursed lip breathing techniques or other breathing techniques.  To learn and understand importance of maintaining oxygen saturations>88%;To learn and understand importance of monitoring SPO2 with pulse oximeter and demonstrate accurate use of the pulse oximeter.;To learn and demonstrate proper pursed lip breathing techniques or other breathing techniques. ;To learn and exhibit compliance with exercise, home and travel O2 prescription;To learn and demonstrate proper use of respiratory medications To learn and understand importance of maintaining oxygen saturations>88%;To learn and understand importance of monitoring SPO2 with pulse  oximeter and demonstrate accurate use of the pulse oximeter.;To learn and demonstrate proper pursed lip breathing techniques or other breathing techniques. ;To learn and exhibit compliance with exercise, home and travel O2 prescription;To learn and demonstrate proper use of respiratory medications To learn and understand importance of maintaining oxygen saturations>88%;To learn and understand importance of monitoring SPO2  with pulse oximeter and demonstrate accurate use of the pulse oximeter.;To learn and demonstrate proper pursed lip breathing techniques or other breathing techniques. ;To learn and exhibit compliance with exercise, home and travel O2 prescription;To learn and demonstrate proper use of respiratory medications To learn and understand importance of maintaining oxygen saturations>88%;To learn and understand importance of monitoring SPO2 with pulse oximeter and demonstrate accurate use of the pulse oximeter.;To learn and demonstrate proper pursed lip breathing techniques or other breathing techniques. ;To learn and exhibit compliance with exercise, home and travel O2 prescription;To learn and demonstrate proper use of respiratory medications   Long  Term Goals Maintenance of O2 saturations>88%;Verbalizes importance of monitoring SPO2 with pulse oximeter and return demonstration;Exhibits proper breathing techniques, such as pursed lip breathing or other method taught during program session Maintenance of O2 saturations>88%;Verbalizes importance of monitoring SPO2 with pulse oximeter and return demonstration;Exhibits proper breathing techniques, such as pursed lip breathing or other method taught during program session;Exhibits compliance with exercise, home  and travel O2 prescription;Demonstrates proper use of MDI's;Compliance with respiratory medication Maintenance of O2 saturations>88%;Verbalizes importance of monitoring SPO2 with pulse oximeter and return demonstration;Exhibits proper breathing  techniques, such as pursed lip breathing or other method taught during program session;Exhibits compliance with exercise, home  and travel O2 prescription;Demonstrates proper use of MDI's;Compliance with respiratory medication Maintenance of O2 saturations>88%;Verbalizes importance of monitoring SPO2 with pulse oximeter and return demonstration;Exhibits proper breathing techniques, such as pursed lip breathing or other method taught during program session;Exhibits compliance with exercise, home  and travel O2 prescription;Demonstrates proper use of MDI's;Compliance with respiratory medication Maintenance of O2 saturations>88%;Verbalizes importance of monitoring SPO2 with pulse oximeter and return demonstration;Exhibits proper breathing techniques, such as pursed lip breathing or other method taught during program session;Exhibits compliance with exercise, home  and travel O2 prescription;Demonstrates proper use of MDI's;Compliance with respiratory medication   Comments Reviewed PLB technique with pt.  Talked about how it works and it's importance in maintaining their exercise saturations. Munir is doing well in rehab.  He is good about wearing his oxygen when at home and out and about.  However, he does take it off to go to mailbox and gets winded quickly.  He was encourage to wear it even then to maintain saturations.  He is doing well on his medications  He is good about using his PLB. Zekiah is doing well in rehab. His is wearing his oxygen as precriped and not needing to turn it up during exercise. His oxygen is staying WNL when exercising. It will drop to the lower 90s when he increases his speed or level but with PLB he is able to bring it back up. Ciel is doing well at home and during rehab with his oxygen. He states his breathing gets a little difficult at home when he is doing stuff, but that it is a lot better than it used to be. Mahmud is doing well in rehab.  He is doing well with his oxygen and breathing.  He is good about using his PLB and saturations are doing well.  He is feeling good on his medications.   Goals/Expected Outcomes Short: Become more profiecient at using PLB.   Long: Become independent at using PLB. Short: Wear O2 out to mailbox Long; Conitnue to monitor saturations closely. Short: Contineu to work on PLB when exercising  Long; Conitnue to monitor saturations closely. Short: Continue to monitor 02 sat's when exercising and use PLB techniques. Long: Monitor O2 sat's more closely at home.  Short: Conitnue to watch oxygen levels Long: Continued compliance            Oxygen Discharge (Final Oxygen Re-Evaluation):  Oxygen Re-Evaluation - 08/03/23 1408       Program Oxygen Prescription   Program Oxygen Prescription Continuous;E-Tanks    Liters per minute 3      Home Oxygen   Home Oxygen Device Home Concentrator;Portable Concentrator;E-Tanks    Sleep Oxygen Prescription Continuous    Liters per minute 2    Home Exercise Oxygen Prescription Continuous    Liters per minute 2    Home Resting Oxygen Prescription Continuous    Liters per minute 2    Compliance with Home Oxygen Use Yes      Goals/Expected Outcomes   Short Term Goals To learn and understand importance of maintaining oxygen saturations>88%;To learn and understand importance of monitoring SPO2 with pulse oximeter and demonstrate accurate use of the pulse oximeter.;To learn and demonstrate proper pursed lip breathing techniques or other breathing techniques. ;To learn and exhibit compliance with exercise, home and travel O2 prescription;To learn and demonstrate proper use of respiratory medications    Long  Term Goals Maintenance of O2 saturations>88%;Verbalizes importance of monitoring SPO2 with pulse oximeter and return demonstration;Exhibits proper breathing techniques, such as pursed lip breathing or other method taught during program session;Exhibits compliance with exercise, home  and travel O2  prescription;Demonstrates proper use of MDI's;Compliance with respiratory medication    Comments Avyon is doing well in rehab.  He is doing well with his oxygen and breathing. He is good about using his PLB and saturations are doing well.  He is feeling good on his medications.    Goals/Expected Outcomes Short: Conitnue to watch oxygen levels Long: Continued compliance             Initial Exercise Prescription:  Initial Exercise Prescription - 05/03/23 1500       Date of Initial Exercise RX and Referring Provider   Date 05/03/23    Referring Provider [facility]ary [facility]     Oxygen   Oxygen Continuous    Liters 3    Maintain Oxygen Saturation 88% or higher      NuStep   Level 1    SPM 80    Minutes 15    METs 2.1      REL-XR   Level 1    Speed 50    Minutes 15    METs 2.1      Track   Laps 10    Minutes 15    METs 2      Prescription Details   Frequency (times per week) 2    Duration Progress to 30 minutes of continuous aerobic without signs/symptoms of physical distress      Intensity   THRR 40-80% of Max Heartrate 91-131    Ratings of Perceived Exertion 11-13    Perceived Dyspnea 0-4      Progression   Progression Continue to progress workloads to maintain intensity without signs/symptoms of physical distress.      Resistance Training   Training Prescription Yes    Weight 3 lb    Reps 10-15             Perform Capillary Blood Glucose checks as needed.  Exercise Prescription Changes:   Exercise Prescription Changes     Row Name 05/03/23 1500 05/27/23 1400 06/22/23 1500 07/27/23 1500 08/19/23 1500     Response to Exercise   Blood Pressure (  Admit) 118/66 140/78 140/78 160/88 138/78   Blood Pressure (Exercise) 148/84 152/90 124/84 -- --   Blood Pressure (Exit) 112/66 140/70 142/78 130/80 130/84   Heart Rate (Admit) 68 bpm 77 bpm 85 bpm 93 bpm 73 bpm   Heart Rate (Exercise) 106 bpm 97 bpm 97 bpm 103 bpm 94 bpm   Heart Rate (Exit) 80  bpm 76 bpm 82 bpm 100 bpm 85 bpm   Oxygen Saturation (Admit) 93 % 93 % 94 % 93 % 98 %   Oxygen Saturation (Exercise) 86 % 95 % 95 % 93 % 96 %   Oxygen Saturation (Exit) 93 % 95 % 94 % 93 % 90 %   Rating of Perceived Exertion (Exercise) 14 13 13 13 14    Perceived Dyspnea (Exercise) 2 3 2 2 2    Symptoms SOB, chest tightness 3/10 -- -- -- --   Comments walk test results -- -- -- --   Duration -- Continue with 30 min of aerobic exercise without signs/symptoms of physical distress. Continue with 30 min of aerobic exercise without signs/symptoms of physical distress. Continue with 30 min of aerobic exercise without signs/symptoms of physical distress. Continue with 30 min of aerobic exercise without signs/symptoms of physical distress.   Intensity -- THRR unchanged THRR unchanged THRR unchanged THRR unchanged     Progression   Progression -- Continue to progress workloads to maintain intensity without signs/symptoms of physical distress. Continue to progress workloads to maintain intensity without signs/symptoms of physical distress. Continue to progress workloads to maintain intensity without signs/symptoms of physical distress. Continue to progress workloads to maintain intensity without signs/symptoms of physical distress.     Resistance Training   Training Prescription -- Yes Yes Yes Yes   Weight -- 3 4 4 4    Reps -- 10-15 10-15 10-15 10-15     Oxygen   Oxygen -- Continuous Continuous Continuous Continuous   Liters -- 3 3 4 3      NuStep   Level -- 3 3 2 2    SPM -- 78 91 80 84   Minutes -- 15 15 15 15    METs -- 1.8 2 1.9 1.9     Arm Ergometer   Level -- -- -- 1 2   Watts -- -- -- 50 52   Minutes -- -- -- 15 15   METs -- -- -- 1.8 1.9     REL-XR   Level -- 3 3 -- --   Speed -- 42 43 -- --   Minutes -- 15 15 -- --   METs -- 2 2.1 -- --     Oxygen   Maintain Oxygen Saturation -- 88% or higher 88% or higher 88% or higher 88% or higher            Exercise Comments:    Exercise Comments     Row Name 05/11/23 1515 07/27/23 1403         Exercise Comments First full day of exercise!  Patient was oriented to gym and equipment including functions, settings, policies, and procedures.  Patient's individual exercise prescription and treatment plan were reviewed.  All starting workloads were established based on the results of the 6 minute walk test done at initial orientation visit.  The plan for exercise progression was also introduced and progression will be customized based on patient's performance and goals. Cortavious returned today. He had been out with a sore knee as it popped in class when standing the last time he was here  at the beginning of the month. He strained the tendons and may have small tear but was told he could exercise with a supportive knee brace on during class.               Exercise Goals and Review:   Exercise Goals     Row Name 05/03/23 1534             Exercise Goals   Increase Physical Activity Yes       Intervention Provide advice, education, support and counseling about physical activity/exercise needs.;Develop an individualized exercise prescription for aerobic and resistive training based on initial evaluation findings, risk stratification, comorbidities and participant's personal goals.       Expected Outcomes Short Term: Attend rehab on a regular basis to increase amount of physical activity.;Long Term: Add in home exercise to make exercise part of routine and to increase amount of physical activity.;Long Term: Exercising regularly at least 3-5 days a week.       Increase Strength and Stamina Yes       Intervention Provide advice, education, support and counseling about physical activity/exercise needs.;Develop an individualized exercise prescription for aerobic and resistive training based on initial evaluation findings, risk stratification, comorbidities and participant's personal goals.       Expected Outcomes Short Term:  Increase workloads from initial exercise prescription for resistance, speed, and METs.;Short Term: Perform resistance training exercises routinely during rehab and add in resistance training at home;Long Term: Improve cardiorespiratory fitness, muscular endurance and strength as measured by increased METs and functional capacity ( )       Able to understand and use rate of perceived exertion (RPE) scale Yes       Intervention Provide education and explanation on how to use RPE scale       Expected Outcomes Short Term: Able to use RPE daily in rehab to express subjective intensity level;Long Term:  Able to use RPE to guide intensity level when exercising independently       Able to understand and use Dyspnea scale Yes       Intervention Provide education and explanation on how to use Dyspnea scale       Expected Outcomes Short Term: Able to use Dyspnea scale daily in rehab to express subjective sense of shortness of breath during exertion;Long Term: Able to use Dyspnea scale to guide intensity level when exercising independently       Knowledge and understanding of Target Heart Rate Range (THRR) Yes       Intervention Provide education and explanation of THRR including how the numbers were predicted and where they are located for reference       Expected Outcomes Short Term: Able to state/look up THRR;Long Term: Able to use THRR to govern intensity when exercising independently;Short Term: Able to use daily as guideline for intensity in rehab       Able to check pulse independently Yes       Intervention Review the importance of being able to check your own pulse for safety during independent exercise;Provide education and demonstration on how to check pulse in carotid and radial arteries.       Expected Outcomes Short Term: Able to explain why pulse checking is important during independent exercise;Long Term: Able to check pulse independently and accurately       Understanding of Exercise  Prescription Yes       Intervention Provide education, explanation, and written materials on patient's individual exercise prescription  Expected Outcomes Short Term: Able to explain program exercise prescription;Long Term: Able to explain home exercise prescription to exercise independently                Exercise Goals Re-Evaluation :  Exercise Goals Re-Evaluation     Row Name 05/11/23 1515 05/18/23 1332 06/29/23 1502 07/27/23 1424 07/29/23 0903     Exercise Goal Re-Evaluation   Exercise Goals Review Able to understand and use rate of perceived exertion (RPE) scale;Knowledge and understanding of Target Heart Rate Range (THRR);Understanding of Exercise Prescription;Able to understand and use Dyspnea scale Increase Physical Activity;Increase Strength and Stamina;Understanding of Exercise Prescription Increase Physical Activity;Increase Strength and Stamina;Understanding of Exercise Prescription Increase Physical Activity;Increase Strength and Stamina;Able to understand and use Dyspnea scale;Knowledge and understanding of Target Heart Rate Range (THRR);Able to understand and use rate of perceived exertion (RPE) scale;Able to check pulse independently;Understanding of Exercise Prescription Increase Physical Activity;Increase Strength and Stamina;Understanding of Exercise Prescription   Comments Reviewed RPE and dyspnea scale, THR and program prescription with pt today.  Pt voiced understanding and was given a copy of goals to take home. Yigit is off to a good start in rehab.  He is already starting to feel better.  He finds that he is no longer sitting as much.  He does note some dry mouth and was encouraged to keep drinking water and exercising regularly.  He does have some weights and has already started to use them at home.  We will review home exercise after a few more sessions. Tremane is doing well in rehab. He is increasing his levels on the XR and Nustep and is tolerationg the resistance  well. He has noticed his SOB getting better when exercising. Naser states he is doing some exercise at home such as exercising his R leg that was injured, lifting 5 pound weights and walking around outside. Wilma is doing well in rehab. He recenty injured his R knee and had to move to the arm erogmeter instrad of using the seated elliptical.   Expected Outcomes Short: Use RPE daily to regulate intensity.  Long: Follow program prescription in THR. Short: Conitnue to move more Long: Continue to attend rehab regularly. Short: Conitnue to move more Long: Continue to attend rehab regularly. Short: Continue to move more. Long: Continue to attend rehab regularly. Continue to attend rehab    Row Name 08/03/23 1402             Exercise Goal Re-Evaluation   Exercise Goals Review Increase Physical Activity;Increase Strength and Stamina;Understanding of Exercise Prescription       Comments Daelen is doing well in rehab.  He is working on getting back after hurting his knee.  It is still bothering him some and he has a MRI scheduled soon to get a better look at it since it is still causing some pain on the back side.  He is doing leg exercises to strenghten at home.  He is also using 5 lb weights for upper body.       Expected Outcomes Short: Find out more about knee Long: COnitnue to exercise to build stamina                Discharge Exercise Prescription (Final Exercise Prescription Changes):  Exercise Prescription Changes - 08/19/23 1500       Response to Exercise   Blood Pressure (Admit) 138/78    Blood Pressure (Exit) 130/84    Heart Rate (Admit) 73 bpm    Heart Rate (  Exercise) 94 bpm    Heart Rate (Exit) 85 bpm    Oxygen Saturation (Admit) 98 %    Oxygen Saturation (Exercise) 96 %    Oxygen Saturation (Exit) 90 %    Rating of Perceived Exertion (Exercise) 14    Perceived Dyspnea (Exercise) 2    Duration Continue with 30 min of aerobic exercise without signs/symptoms of physical distress.     Intensity THRR unchanged      Progression   Progression Continue to progress workloads to maintain intensity without signs/symptoms of physical distress.      Resistance Training   Training Prescription Yes    Weight 4    Reps 10-15      Oxygen   Oxygen Continuous    Liters 3      NuStep   Level 2    SPM 84    Minutes 15    METs 1.9      Arm Ergometer   Level 2    Watts 52    Minutes 15    METs 1.9      Oxygen   Maintain Oxygen Saturation 88% or higher             Nutrition:  Target Goals: Understanding of nutrition guidelines, daily intake of sodium 1500mg , cholesterol 200mg , calories 30% from fat and 7% or less from saturated fats, daily to have 5 or more servings of fruits and vegetables.  Biometrics:  Pre Biometrics - 05/03/23 1534       Pre Biometrics   Height 5\' 6"  (1.676 m)    Weight 184 lb 8 oz (83.7 kg)    Waist Circumference 40.5 inches    Hip Circumference 40 inches    Waist to Hip Ratio 1.01 %    BMI (Calculated) 29.79    Grip Strength 32.1 kg    Single Leg Stand 4.4 seconds              Nutrition Therapy Plan and Nutrition Goals:  Nutrition Therapy & Goals - 05/03/23 1535       Intervention Plan   Intervention Prescribe, educate and counsel regarding individualized specific dietary modifications aiming towards targeted core components such as weight, hypertension, lipid management, diabetes, heart failure and other comorbidities.;Nutrition handout(s) given to patient.    Expected Outcomes Short Term Goal: Understand basic principles of dietary content, such as calories, fat, sodium, cholesterol and nutrients.;Long Term Goal: Adherence to prescribed nutrition plan.             Nutrition Assessments:  MEDIFICTS Score Key: >=70 Need to make dietary changes  40-70 Heart Healthy Diet <= 40 Therapeutic Level Cholesterol Diet  Flowsheet Row PULMONARY REHAB COPD ORIENTATION from 05/03/2023 in [facility] REHABILITATION   Picture Your Plate Total Score on Admission 59      Picture Your Plate Scores: <11 Unhealthy dietary pattern with much room for improvement. 41-50 Dietary pattern unlikely to meet recommendations for good health and room for improvement. 51-60 More healthful dietary pattern, with some room for improvement.  >60 Healthy dietary pattern, although there may be some specific behaviors that could be improved.    Nutrition Goals Re-Evaluation:  Nutrition Goals Re-Evaluation     Row Name 05/18/23 1339 06/29/23 1504 07/27/23 1418 08/03/23 1405       Goals   Nutrition Goal Healthy Diet Healthy Diet Healthy Diet Short: continue to eat healthy. Long: Aim for a variety in foods.    Comment Traycen is working on his diet.  He tries to stick to it as much as possible.  He aims to stay away from sweets and soda.  He wants to work on losing weight. He is doing well with a variety of fruits and vegetables.  He tries to get good proteins.  He really enjoys baked/broiled chicken with vegetables.  He is not a big breakfast eater and usually gets about two meals a day. Clearance is continuing to work on his diet and  is working on losing weight. Leib states he is currently eating healthy. Jayion continues to eat healthy.  He usually gets two main meals a day and both include protein.  He likes to do an egg salad sandwich for lunch and a big salad for dinner.  He loves to mix up his salads each day to get a good variety.    Expected Outcome Short: Conitnue to cut back on sweets Long: Aim for a variety. Short: Conitnue to cut back on sweets Long: Aim for a variety. Short: continue to eat healthy. Long: Aim for a variety in foods. Short; Conitnue to add variety Long: Continue to eat healthy             Nutrition Goals Discharge (Final Nutrition Goals Re-Evaluation):  Nutrition Goals Re-Evaluation - 08/03/23 1405       Goals   Nutrition Goal Short: continue to eat healthy. Long: Aim for a variety in foods.     Comment Safir continues to eat healthy.  He usually gets two main meals a day and both include protein.  He likes to do an egg salad sandwich for lunch and a big salad for dinner.  He loves to mix up his salads each day to get a good variety.    Expected Outcome Short; Conitnue to add variety Long: Continue to eat healthy             Psychosocial: Target Goals: Acknowledge presence or absence of significant depression and/or stress, maximize coping skills, provide positive support system. Participant is able to verbalize types and ability to use techniques and skills needed for reducing stress and depression.  Initial Review & Psychosocial Screening:  Initial Psych Review & Screening - 04/26/23 1340       Initial Review   Current issues with Current Sleep Concerns;Current Psychotropic Meds;History of Depression;Current Depression   PSTD with visual hallucinations     Family Dynamics   Good Support System? Yes    Comments His 2 daughters are his support.      Barriers   Psychosocial barriers to participate in program The patient should benefit from training in stress management and relaxation.      Screening Interventions   Interventions Encouraged to exercise;To provide support and resources with identified psychosocial needs;Provide feedback about the scores to participant    Expected Outcomes Short Term goal: Utilizing psychosocial counselor, staff and physician to assist with identification of specific Stressors or current issues interfering with healing process. Setting desired goal for each stressor or current issue identified.;Long Term Goal: Stressors or current issues are controlled or eliminated.;Short Term goal: Identification and review with participant of any Quality of Life or Depression concerns found by scoring the questionnaire.;Long Term goal: The participant improves quality of Life and PHQ9 Scores as seen by post scores and/or verbalization of changes              Quality of Life Scores:  Scores of 19 and below usually indicate a poorer quality of life in these areas.  A difference of  2-3 points is a clinically meaningful difference.  A difference of 2-3 points in the total score of the Quality of Life Index has been associated with significant improvement in overall quality of life, self-image, physical symptoms, and general health in studies assessing change in quality of life.   PHQ-9: Review Flowsheet       08/03/2023 07/06/2023 05/18/2023 05/03/2023  Depression screen PHQ 2/9  Decreased Interest 0 1 1 2   Down, Depressed, Hopeless 0 1 0 2  PHQ - 2 Score 0 2 1 4   Altered sleeping 1 0 0 2  Tired, decreased energy 1 1 3 3   Change in appetite 0 1 0 2  Feeling bad or failure about yourself  0 0 0 2  Trouble concentrating 1 1 2 3   Moving slowly or fidgety/restless 1 1 2 2   Suicidal thoughts 0 0 0 0  PHQ-9 Score 4 6 8 18   Difficult doing work/chores Somewhat difficult Somewhat difficult Somewhat difficult Very difficult   Interpretation of Total Score  Total Score Depression Severity:  1-4 = Minimal depression, 5-9 = Mild depression, 10-14 = Moderate depression, 15-19 = Moderately severe depression, 20-27 = Severe depression   Psychosocial Evaluation and Intervention:  Psychosocial Evaluation - 04/26/23 1343       Psychosocial Evaluation & Interventions   Interventions Stress management education;Relaxation education;Encouraged to exercise with the program and follow exercise prescription    Comments Patient was referred to [facility] with COPD from the [place]. He served in [place] and does report some depression as well as PTSD with occasionally visual and auditory hallucinations which he says is treated with Buspar. He says his daughter was living with him but she has moved out and he now lives alone which has been a diffucult ajustment for him. He has 2 daughters that do support him with his needs. He says he does attend church which helps him  socially. He reports trouble staying asleep mainly due to feelings of choking on sinus drainage. He used to work painting houses and he thinks the fumes from the paint and exposure to chemicals while serving in the [employer] damaged his lungs. He is walking some at [facility] but says he can't do a lot. His goals for the program are to improve his strength and stamina and breathing. He has no barriers identified to completing the program.    Expected Outcomes Short Term: start the program and attend consistently. Long Term: meet his personal goals.    Continue Psychosocial Services  Follow up required by staff             Psychosocial Re-Evaluation:  Psychosocial Re-Evaluation     Row Name 05/18/23 1334 06/29/23 1504 07/07/23 1510 07/27/23 1417 08/03/23 1404     Psychosocial Re-Evaluation   Current issues with Current Stress Concerns;Current Depression;Current Psychotropic Meds Current Stress Concerns;Current Depression;Current Psychotropic Meds History of Depression History of Depression History of Depression   Comments Arseniy is doing well in rehab so far.  He is still seeing his counselor at the [place] every month for his PTSD.  He s already feeling better as he is moving more.  He is sleeping well for most part.  Overall, he is feeling better.  His PHQ has improved by 10 pts already, we will continue to check in with him.  He tries not to dwell on things too much and enjoyed stress education last week. Kolter is doing well in rehab. He continuse to see his [place]  counselor. He is overall feeling better with exercise. Reviewed patient health questionnaire (PHQ-9) with patient for follow up. Previously, patient's score indicated signs/symptoms of depression. Reviewed to see if patient is improving symptom wise while in program. Score declined, when reading the questions to the pt I asked each questions different ways so the pt could understand what was being asked. Pt did not fully understand each question. Pt  not having any current stressors, just his history of depression that he keeps taking his meds for. Reviewed patient health questionnaire (PHQ-9) with patient for follow up. Previously, patients score indicated signs/symptoms of depression.  Reviewed to see if patient is improving symptom wise while in program.  Score improved and patient states that it is because they have been moving more and sleeping better.   Expected Outcomes Short: Continue to exercise for mental boost Long: conitnue to work with PTSD Short: Continue to exercise for mental boost Long: conitnue to work with PTSD Short: Continue to work toward an improvement in PHQ9 scores by attending rehab regularly. Long: Continue to improve stress and depression coping skills by talking with staff and attending rehab regularly and work toward a positive mental state. Short: Continue to attend rehab. Long: Continue to improve stress levels. Short: Continue to attend rehab egularly for regular exercise and social engagement. Long: Continue to improve symptoms and manage a positive mental state.   Interventions -- -- Encouraged to attend Pulmonary Rehabilitation for the exercise Stress management education;Relaxation education;Encouraged to attend Pulmonary Rehabilitation for the exercise Stress management education;Relaxation education;Encouraged to attend Pulmonary Rehabilitation for the exercise   Continue Psychosocial Services  -- -- Follow up required by staff Follow up required by staff Follow up required by staff            Psychosocial Discharge (Final Psychosocial Re-Evaluation):  Psychosocial Re-Evaluation - 08/03/23 1404       Psychosocial Re-Evaluation   Current issues with History of Depression    Comments Reviewed patient health questionnaire (PHQ-9) with patient for follow up. Previously, patients score indicated signs/symptoms of depression.  Reviewed to see if patient is improving symptom wise while in program.  Score improved  and patient states that it is because they have been moving more and sleeping better.    Expected Outcomes Short: Continue to attend rehab egularly for regular exercise and social engagement. Long: Continue to improve symptoms and manage a positive mental state.    Interventions Stress management education;Relaxation education;Encouraged to attend Pulmonary Rehabilitation for the exercise    Continue Psychosocial Services  Follow up required by staff              Education: Education Goals: Education classes will be provided on a weekly basis, covering required topics. Participant will state understanding/return demonstration of topics presented.  Learning Barriers/Preferences:  Learning Barriers/Preferences - 04/26/23 1327       Learning Barriers/Preferences   Learning Barriers None    Learning Preferences Skilled Demonstration;Audio;Written Material             Education Topics: How Lungs Work and Diseases: - Discuss the anatomy of the lungs and diseases that can affect the lungs, such as COPD. Flowsheet Row PULMONARY REHAB CHRONIC OBSTRUCTIVE PULMONARY DISEASE from 08/19/2023 in [facility]  Date 06/03/23  Educator jh  Instruction Review Code 1- Verbalizes Understanding       Exercise: -Discuss the importance of exercise, FITT principles of exercise, normal and abnormal responses to exercise, and how to exercise safely.  Environmental Irritants: -Discuss types of environmental irritants and how to limit exposure to environmental irritants.   Meds/Inhalers and oxygen: - Discuss respiratory medications, definition of an inhaler and oxygen, and the proper way to use an inhaler and oxygen.   Energy Saving Techniques: - Discuss methods to conserve energy and decrease shortness of breath when performing activities of daily living.    Bronchial Hygiene / Breathing Techniques: - Discuss breathing mechanics, pursed-lip breathing technique,   proper posture, effective ways to clear airways, and other functional breathing techniques   Cleaning Equipment: - Provides group verbal and written instruction about the health risks of elevated stress, cause of high stress, and healthy ways to reduce stress.   Nutrition I: Fats: - Discuss the types of cholesterol, what cholesterol does to the body, and how cholesterol levels can be controlled.   Nutrition II: Labels: -Discuss the different components of food labels and how to read food labels.   Respiratory Infections: - Discuss the signs and symptoms of respiratory infections, ways to prevent respiratory infections, and the importance of seeking medical treatment when having a respiratory infection.   Stress I: Signs and Symptoms: - Discuss the causes of stress, how stress may lead to anxiety and depression, and ways to limit stress. Flowsheet Row PULMONARY REHAB CHRONIC OBSTRUCTIVE PULMONARY DISEASE from 08/19/2023 in [facility]  Date 05/13/23  Educator [facility]  Instruction Review Code 1- Verbalizes Understanding       Stress II: Relaxation: -Discuss relaxation techniques to limit stress. Flowsheet Row PULMONARY REHAB CHRONIC OBSTRUCTIVE PULMONARY DISEASE from 08/19/2023 in [place] [facility]  Date 07/01/23  Educator [facility]  Instruction Review Code 1- Verbalizes Understanding       Oxygen for Home/Travel: - Discuss how to prepare for travel when on oxygen and proper ways to transport and store oxygen to ensure safety.   Knowledge Questionnaire Score:  Knowledge Questionnaire Score - 05/03/23 1535       Knowledge Questionnaire Score   Pre Score 11/18             Core Components/Risk Factors/Patient Goals at Admission:  Personal Goals and Risk Factors at Admission - 05/03/23 1535       Core Components/Risk Factors/Patient Goals on Admission    Weight Management Yes;Weight Loss    Intervention Weight Management: Develop a  combined nutrition and exercise program designed to reach desired caloric intake, while maintaining appropriate intake of nutrient and fiber, sodium and fats, and appropriate energy expenditure required for the weight goal.;Weight Management: Provide education and appropriate resources to help participant work on and attain dietary goals.;Weight Management/Obesity: Establish reasonable short term and long term weight goals.    Admit Weight 184 lb 8 oz (83.7 kg)    Goal Weight: Short Term 179 lb (81.2 kg)    Goal Weight: Long Term 165 lb (74.8 kg)    Expected Outcomes Short Term: Continue to assess and modify interventions until short term weight is achieved;Long Term: Adherence to nutrition and physical activity/exercise program aimed toward attainment of established weight goal;Weight Loss: Understanding of general recommendations for a balanced deficit meal plan, which promotes 1-2 lb weight loss per week and includes a negative energy balance of [phone] kcal/d;Understanding recommendations for meals to include 15-35% energy as protein, 25-35% energy from fat, 35-60% energy from carbohydrates, less than 200mg  of dietary cholesterol, 20-35 gm of total fiber daily;Understanding of distribution of calorie intake throughout the day with the consumption of 4-5 meals/snacks    Improve shortness  of breath with ADL's Yes    Expected Outcomes Short Term: Improve cardiorespiratory fitness to achieve a reduction of symptoms when performing ADLs;Long Term: Be able to perform more ADLs without symptoms or delay the onset of symptoms    Increase knowledge of respiratory medications and ability to use respiratory devices properly  Yes    Intervention Provide education and demonstration as needed of appropriate use of medications, inhalers, and oxygen therapy.    Expected Outcomes Short Term: Achieves understanding of medications use. Understands that oxygen is a medication prescribed by physician. Demonstrates  appropriate use of inhaler and oxygen therapy.;Long Term: Maintain appropriate use of medications, inhalers, and oxygen therapy.    Intervention Provide education on lifestyle modifcations including regular physical activity/exercise, weight management, moderate sodium restriction and increased consumption of fresh fruit, vegetables, and low fat dairy, alcohol moderation, and smoking cessation.;Monitor prescription use compliance.    Expected Outcomes Short Term: Continued assessment and intervention until BP is < 140/36mm HG in hypertensive participants. < 130/65mm HG in hypertensive participants with diabetes, heart failure or chronic kidney disease.;Long Term: Maintenance of blood pressure at goal levels.    Lipids Yes    Intervention Provide education and support for participant on nutrition & aerobic/resistive exercise along with prescribed medications to achieve LDL 70mg , HDL >40mg .    Expected Outcomes Short Term: Participant states understanding of desired cholesterol values and is compliant with medications prescribed. Participant is following exercise prescription and nutrition guidelines.;Long Term: Cholesterol controlled with medications as prescribed, with individualized exercise RX and with personalized nutrition plan. Value goals: LDL < 70mg , HDL > 40 mg.             Core Components/Risk Factors/Patient Goals Review:   Goals and Risk Factor Review     Row Name 05/18/23 1341 06/29/23 1505 07/27/23 1419 08/03/23 1406       Core Components/Risk Factors/Patient Goals Review   Personal Goals Review Weight Management/Obesity;Improve shortness of breath with ADL's;Increase knowledge of respiratory medications and ability to use respiratory devices properly.;Hypertension;Lipids Weight Management/Obesity;Improve shortness of breath with ADL's;Increase knowledge of respiratory medications and ability to use respiratory devices properly.;Hypertension;Lipids Weight Management/Obesity;Improve  shortness of breath with ADL's;Increase knowledge of respiratory medications and ability to use respiratory devices properly.;Hypertension;Lipids Weight Management/Obesity;Improve shortness of breath with ADL's;Increase knowledge of respiratory medications and ability to use respiratory devices properly.;Hypertension;Lipids    Review Cadel is doing well in rehab.  He wants to lose more weight is working on continuing to improve his diet to help.  His breathing has gotten better as he is not sitting as much anymore.  His blood pressures are doing well and he does check them at home.  He keeps a record to show his doctors regularly.  He was at [facility] yesterday for a general f/u and is doing well.  His breathing is doing well, but has some rougher days.  He is doing well with his meds.  He is good about wearing his oxygen when out and about but not out to [employer].  He was encouraged to wear it regularly to maintain saturations. Jaystin continues to do well in rehab. He is watching what he eats so he is able to lose weight and improve his diet. His blood pressure continues to do well in rehab and his oxygen is staying WNL during exercise. His is working on his PLB when in rehab=. Vihaan is coming back from being out of rehab due to a knee injury. He is taking all of his  medications as prescribed, especially his respiratory medications. He is eager to continue working in rehab. Gaberiel continues to work on [employer] loss.  He is also watching his knee and has an MRI scheduled.  His breathing is doing better overall.  His pressures are still running on the higher side but he is keeping eye on it and his daughter is also watching it too.    Expected Outcomes Short: Continue to work on weight loss.  Long: Continue to montior risk factors. Short: Continue to work on weight loss.  Long: Continue to montior risk factors. Short: Continue to work on weight loss and building strength.  Long: Continue to monitor risk factors. Short: Continue to  work on blood pressures Long: Conitnue to monitor risk factors.             Core Components/Risk Factors/Patient Goals at Discharge (Final Review):   Goals and Risk Factor Review - 08/03/23 1406       Core Components/Risk Factors/Patient Goals Review   Personal Goals Review Weight Management/Obesity;Improve shortness of breath with ADL's;Increase knowledge of respiratory medications and ability to use respiratory devices properly.;Hypertension;Lipids    Review Jovanny continues to work on weight loss.  He is also watching his knee and has an MRI scheduled.  His breathing is doing better overall.  His pressures are still running on the higher side but he is keeping eye on it and his daughter is also watching it too.    Expected Outcomes Short: Continue to work on blood pressures Long: Conitnue to monitor risk factors.             ITP Comments:  ITP Comments     Row Name 05/03/23 1530 05/04/23 1436 05/11/23 1515 06/02/23 1138 06/30/23 1349   ITP Comments Patient attend orientation today.  Patient is attending Pulmonary Rehabilitation Program.  Documentation for diagnosis can be found in [place] notes in Media for today's appointment.  Reviewed medical chart, RPE/RPD, gym safety, and program guidelines.  Patient was fitted to equipment they will be using during rehab.  Patient is scheduled to start exercise on Tues 05/11/23 at 1330.   Initial ITP created and sent for review and signature by Dr. Gwendalyn Lemma, Medical Director for Pulmonary Rehabilitation Program. 30 day review completed. ITP sent to Dr.Jehanzeb Memon, Medical Director of  Pulmonary Rehab. Continue with ITP unless changes are made by physician.  Only attended orientation yesterday. First full day of exercise!  Patient was oriented to gym and equipment including functions, settings, policies, and procedures.  Patient's individual exercise prescription and treatment plan were reviewed.  All starting workloads were established based on  the results of the 6 minute walk test done at initial orientation visit.  The plan for exercise progression was also introduced and progression will be customized based on patient's performance and goals. 30 day review completed. ITP sent to Dr.Jehanzeb Memon, Medical Director of  Pulmonary Rehab. Continue with ITP unless changes are made by physician. 30 day review completed. ITP sent to Dr.Jehanzeb Memon, Medical Director of  Pulmonary Rehab. Continue with ITP unless changes are made by physician.    Row Name 07/27/23 1401 07/28/23 1151 08/25/23 1452       ITP Comments Cyler returned today.  He had been out with a sore knee as it popped in class when standing the last time he was here at the beginning of the month.  He strained the tendons and may  have small tear but was told he could exercise  with a supportive knee brace on during class. 30 day review completed. ITP sent to Dr.Jehanzeb Memon, Medical Director of  Pulmonary Rehab. Continue with ITP unless changes are made by physician. 30 day review completed. ITP sent to Dr.Jehanzeb Memon, Medical Director of  Pulmonary Rehab. Continue with ITP unless changes are made by physician.              Comments: 30 day review

## 2023-08-26 ENCOUNTER — Encounter (HOSPITAL_COMMUNITY)
Admission: RE | Admit: 2023-08-26 | Discharge: 2023-08-26 | Disposition: A | Discharge status: Home / Self Care | Payer: No Typology Code available for payment source | Source: Ambulatory Visit

## 2023-08-26 DIAGNOSIS — J449 Chronic obstructive pulmonary disease, unspecified: Secondary | ICD-10-CM | POA: Diagnosis not present

## 2023-08-26 NOTE — Progress Notes (Signed)
 Daily Session Note  Patient Details  Name: Chad Hensley MRN: [id number] Date of Birth: [birth year] Referring Provider:   Flowsheet Row PULMONARY REHAB COPD ORIENTATION from 05/03/2023 in [facility] CARDIAC REHABILITATION  Referring Provider Gregary Lean [facility]]       Encounter Date: 08/26/2023  Check In:  Session Check In - 08/26/23 1330       Check-In   Supervising physician immediately available to respond to emergencies See telemetry face sheet for immediately available MD    Location [facility]    Staff Present Rita Cherry, MA, RCEP, CCRP, CCET;Haset [place] Toy Freund, [place], Exercise Physiologist    Virtual Visit No    Medication changes reported     No    Fall or balance concerns reported    No    Tobacco Cessation No Change    Warm-up and Cool-down Performed on first and last piece of equipment    Resistance Training Performed Yes    VAD Patient? No    PAD/SET Patient? No      Pain Assessment   Currently in Pain? No/denies    Pain Score 0-No pain    Multiple Pain Sites No             Capillary Blood Glucose: No results found for this or any previous visit (from the past 24 hours).    Social History   Tobacco Use  Smoking Status Former   Current packs/day: 0.00   Average packs/day: 0.5 packs/day for 2.0 years (1.0 ttl pk-yrs)   Types: Cigarettes   Start date: 04/03/1969   Quit date: 05/04/1970   Years since quitting: 53.3  Smokeless Tobacco Not on file    Goals Met:  Independence with exercise equipment Exercise tolerated well No report of concerns or symptoms today Strength training completed today  Goals Unmet:  Not Applicable  Comments: Pt able to follow exercise prescription today without complaint.  Will continue to monitor for progression.

## 2023-08-31 ENCOUNTER — Encounter (HOSPITAL_COMMUNITY)
Admission: RE | Admit: 2023-08-31 | Discharge: 2023-08-31 | Disposition: A | Discharge status: Home / Self Care | Payer: No Typology Code available for payment source | Source: Ambulatory Visit

## 2023-08-31 DIAGNOSIS — J449 Chronic obstructive pulmonary disease, unspecified: Secondary | ICD-10-CM | POA: Diagnosis not present

## 2023-08-31 NOTE — Progress Notes (Signed)
 Daily Session Note  Patient Details  Name: Chad Hensley MRN: [id number] Date of Birth: Jul 02, 1950 Referring Provider:   Flowsheet Row PULMONARY REHAB COPD ORIENTATION from 05/03/2023 in [facility] CARDIAC REHABILITATION  Referring Provider [facility]       Encounter Date: 08/31/2023  Check In:  Session Check In - 08/31/23 1330       Check-In   Supervising physician immediately available to respond to emergencies See telemetry face sheet for immediately available MD    Location [facility]    Staff Present Clotilda Danish, BS, Exercise Physiologist;Brittany Annette Barters, BSN, RN, Olena Bernard, RN    Virtual Visit No    Medication changes reported     No    Fall or balance concerns reported    No    Warm-up and Cool-down Performed on first and last piece of equipment    Resistance Training Performed Yes    VAD Patient? No    PAD/SET Patient? No      Pain Assessment   Currently in Pain? No/denies             Capillary Blood Glucose: No results found for this or any previous visit (from the past 24 hours).    Social History   Tobacco Use  Smoking Status Former   Current packs/day: 0.00   Average packs/day: 0.5 packs/day for 2.0 years (1.0 ttl pk-yrs)   Types: Cigarettes   Start date: 04/03/1969   Quit date: 05/04/1970   Years since quitting: 53.3  Smokeless Tobacco Not on file    Goals Met:  Independence with exercise equipment Exercise tolerated well Strength training completed today  Goals Unmet:  Not Applicable  Comments: Pt able to follow exercise prescription today without complaint.  Will continue to monitor for progression.

## 2023-09-02 ENCOUNTER — Encounter (HOSPITAL_COMMUNITY)
Admission: RE | Admit: 2023-09-02 | Discharge: 2023-09-02 | Disposition: A | Discharge status: Home / Self Care | Source: Ambulatory Visit

## 2023-09-02 DIAGNOSIS — J449 Chronic obstructive pulmonary disease, unspecified: Secondary | ICD-10-CM | POA: Insufficient documentation

## 2023-09-02 NOTE — Progress Notes (Signed)
 Daily Session Note  Patient Details  Name: Chad Hensley MRN: [id number] Date of Birth: 1950-08-16 Referring Provider:   Flowsheet Row PULMONARY REHAB COPD ORIENTATION from 05/03/2023 in [facility] CARDIAC REHABILITATION  Referring Provider [facility]       Encounter Date: 09/02/2023  Check In:  Session Check In - 09/02/23 1315       Check-In   Supervising physician immediately available to respond to emergencies See telemetry face sheet for immediately available MD    Location [facility]    Staff Present Doug Gehrig, RN, BSN;Heather Toy Freund, BS, Exercise Physiologist    Virtual Visit No    Medication changes reported     No    Fall or balance concerns reported    No    Warm-up and Cool-down Performed on first and last piece of equipment    Resistance Training Performed Yes    VAD Patient? No    PAD/SET Patient? No      Pain Assessment   Currently in Pain? No/denies    Pain Score 0-No pain    Multiple Pain Sites No             Capillary Blood Glucose: No results found for this or any previous visit (from the past 24 hours).    Social History   Tobacco Use  Smoking Status Former   Current packs/day: 0.00   Average packs/day: 0.5 packs/day for 2.0 years (1.0 ttl pk-yrs)   Types: Cigarettes   Start date: 04/03/1969   Quit date: 05/04/1970   Years since quitting: 53.3  Smokeless Tobacco Not on file    Goals Met:  Proper associated with RPD/PD & O2 Sat Independence with exercise equipment Using PLB without cueing & demonstrates good technique Exercise tolerated well No report of concerns or symptoms today Strength training completed today  Goals Unmet:  Not Applicable  Comments: Pt able to follow exercise prescription today without complaint.  Will continue to monitor for progression.

## 2023-09-07 ENCOUNTER — Encounter (HOSPITAL_COMMUNITY)
Admission: RE | Admit: 2023-09-07 | Discharge: 2023-09-07 | Disposition: A | Discharge status: Home / Self Care | Source: Ambulatory Visit

## 2023-09-07 DIAGNOSIS — J449 Chronic obstructive pulmonary disease, unspecified: Secondary | ICD-10-CM | POA: Diagnosis not present

## 2023-09-07 NOTE — Progress Notes (Signed)
 Daily Session Note  Patient Details  Name: Chad Hensley MRN: [id number] Date of Birth: 1950-09-02 Referring Provider:   Flowsheet Row PULMONARY REHAB COPD ORIENTATION from 05/03/2023 in [facility] CARDIAC REHABILITATION  Referring Provider [facility]       Encounter Date: 09/07/2023  Check In:  Session Check In - 09/07/23 1335       Check-In   Supervising physician immediately available to respond to emergencies See telemetry face sheet for immediately available MD    Location [facility]    Staff Present Jerrol Morelle, BSN, RN, WTA-C;Heather Toy Freund, BS, Exercise Physiologist;Brooke Rollin Clock, RN    Virtual Visit No    Medication changes reported     No    Fall or balance concerns reported    No    Tobacco Cessation No Change    Warm-up and Cool-down Performed on first and last piece of equipment    Resistance Training Performed Yes    VAD Patient? No      Pain Assessment   Currently in Pain? No/denies             Capillary Blood Glucose: No results found for this or any previous visit (from the past 24 hours).    Social History   Tobacco Use  Smoking Status Former   Current packs/day: 0.00   Average packs/day: 0.5 packs/day for 2.0 years (1.0 ttl pk-yrs)   Types: Cigarettes   Start date: 04/03/1969   Quit date: 05/04/1970   Years since quitting: 53.3  Smokeless Tobacco Not on file    Goals Met:  Proper associated with RPD/PD & O2 Sat Independence with exercise equipment Improved SOB with ADL's Using PLB without cueing & demonstrates good technique Exercise tolerated well No report of concerns or symptoms today Strength training completed today  Goals Unmet:  Not Applicable  Comments: Pt able to follow exercise prescription today without complaint.  Will continue to monitor for progression.

## 2023-09-09 ENCOUNTER — Encounter (HOSPITAL_COMMUNITY)
Admission: RE | Admit: 2023-09-09 | Discharge: 2023-09-09 | Disposition: A | Discharge status: Home / Self Care | Source: Ambulatory Visit

## 2023-09-09 DIAGNOSIS — J449 Chronic obstructive pulmonary disease, unspecified: Secondary | ICD-10-CM

## 2023-09-09 NOTE — Progress Notes (Signed)
 Daily Session Note  Patient Details  Name: Chad Hensley MRN: [id number] Date of Birth: May 04, 1951 Referring Provider:   Flowsheet Row PULMONARY REHAB COPD ORIENTATION from 05/03/2023 in [facility] CARDIAC REHABILITATION  Referring Provider [facility]       Encounter Date: 09/09/2023  Check In:  Session Check In - 09/09/23 1353       Check-In   Supervising physician immediately available to respond to emergencies See telemetry face sheet for immediately available MD    Location [facility]    Staff Present Ronna Coho BSN, RN;Heather Toy Freund, [place], Exercise Physiologist;Brooke Rollin Clock, RN    Virtual Visit No    Medication changes reported     No    Fall or balance concerns reported    No    Warm-up and Cool-down Performed on first and last piece of equipment    Resistance Training Performed Yes    VAD Patient? No    PAD/SET Patient? No      Pain Assessment   Currently in Pain? No/denies             Capillary Blood Glucose: No results found for this or any previous visit (from the past 24 hours).    Social History   Tobacco Use  Smoking Status Former   Current packs/day: 0.00   Average packs/day: 0.5 packs/day for 2.0 years (1.0 ttl pk-yrs)   Types: Cigarettes   Start date: 04/03/1969   Quit date: 05/04/1970   Years since quitting: 53.3  Smokeless Tobacco Not on file    Goals Met:  Proper associated with RPD/PD & O2 Sat Independence with exercise equipment Using PLB without cueing & demonstrates good technique Exercise tolerated well No report of concerns or symptoms today Strength training completed today  Goals Unmet:  Not Applicable  Comments: Pt able to follow exercise prescription today without complaint.  Will continue to monitor for progression.

## 2023-09-14 ENCOUNTER — Encounter (HOSPITAL_COMMUNITY)

## 2023-09-16 ENCOUNTER — Encounter (HOSPITAL_COMMUNITY)
Admission: RE | Admit: 2023-09-16 | Discharge: 2023-09-16 | Disposition: A | Discharge status: Home / Self Care | Source: Ambulatory Visit

## 2023-09-16 DIAGNOSIS — J449 Chronic obstructive pulmonary disease, unspecified: Secondary | ICD-10-CM | POA: Diagnosis not present

## 2023-09-16 NOTE — Progress Notes (Signed)
 Daily Session Note  Patient Details  Name: Chad Hensley MRN: [id number] Date of Birth: 07-08-1950 Referring Provider:   Flowsheet Row PULMONARY REHAB COPD ORIENTATION from 05/03/2023 in [facility] CARDIAC REHABILITATION  Referring Provider [facility]       Encounter Date: 09/16/2023  Check In:  Session Check In - 09/16/23 1315       Check-In   Supervising physician immediately available to respond to emergencies See telemetry face sheet for immediately available MD    Location [facility]    Staff Present Chad Hensley BSN, RN;Chad Hensley, BS, Exercise Physiologist;Chad Hensley, [place], RCEP, CCRP, CCET    Virtual Visit No    Medication changes reported     No    Fall or balance concerns reported    No    Tobacco Cessation No Change    Warm-up and Cool-down Performed on first and last piece of equipment    Resistance Training Performed Yes    VAD Patient? No    PAD/SET Patient? No      Pain Assessment   Currently in Pain? No/denies    Pain Score 0-No pain    Multiple Pain Sites No             Capillary Blood Glucose: No results found for this or any previous visit (from the past 24 hours).    Social History   Tobacco Use  Smoking Status Former   Current packs/day: 0.00   Average packs/day: 0.5 packs/day for 2.0 years (1.0 ttl pk-yrs)   Types: Cigarettes   Start date: 04/03/1969   Quit date: 05/04/1970   Years since quitting: 53.4  Smokeless Tobacco Not on file    Goals Met:  Proper associated with RPD/PD & O2 Sat Independence with exercise equipment Using PLB without cueing & demonstrates good technique Exercise tolerated well Queuing for purse lip breathing No report of concerns or symptoms today Strength training completed today  Goals Unmet:  Not Applicable  Comments: Chad AasAaron Hensley able to follow exercise prescription today without complaint.  Will continue to monitor for progression.

## 2023-09-21 ENCOUNTER — Encounter (HOSPITAL_COMMUNITY)
Admission: RE | Admit: 2023-09-21 | Discharge: 2023-09-21 | Disposition: A | Discharge status: Home / Self Care | Source: Ambulatory Visit

## 2023-09-21 DIAGNOSIS — J449 Chronic obstructive pulmonary disease, unspecified: Secondary | ICD-10-CM

## 2023-09-22 ENCOUNTER — Encounter (HOSPITAL_COMMUNITY): Payer: Self-pay | Admitting: *Deleted

## 2023-09-22 DIAGNOSIS — J449 Chronic obstructive pulmonary disease, unspecified: Secondary | ICD-10-CM

## 2023-09-22 NOTE — Progress Notes (Signed)
 Pulmonary Individual Treatment Plan  Patient Details  Name: Chad Hensley MRN: [id number] Date of Birth: 1950/06/13 Referring Provider:   Flowsheet Row PULMONARY REHAB COPD ORIENTATION from 05/03/2023 in [facility] CARDIAC REHABILITATION  Referring Provider Briones, [facility]]       Initial Encounter Date:  Flowsheet Row PULMONARY REHAB COPD ORIENTATION from 05/03/2023 in [place] CARDIAC REHABILITATION  Date 05/03/23       Visit Diagnosis: Chronic obstructive pulmonary disease, unspecified COPD type (HCC)  Patient's Home Medications on Admission:   Current Outpatient Medications:    acetaminophen  (TYLENOL ) 500 MG tablet, Take 2 tablets (1,000 mg total) by mouth every 8 (eight) hours as needed for headache., Disp: , Rfl:    ALBUTEROL  IN, Inhale into the lungs as needed., Disp: , Rfl:    amLODipine (NORVASC) 5 MG tablet, Take 5 mg by mouth daily. If BP over 130/85, Disp: , Rfl:    ascorbic acid (VITAMIN C) 500 MG tablet, Take 500 mg by mouth daily., Disp: , Rfl:    aspirin  EC 81 MG tablet, Take 1 tablet (81 mg total) by mouth daily. Swallow whole., Disp: 30 tablet, Rfl: 12   atorvastatin  (LIPITOR) 20 MG tablet, Take 20 mg by mouth daily., Disp: , Rfl:    buPROPion  (WELLBUTRIN  SR) 150 MG 12 hr tablet, Take 150 mg by mouth 2 (two) times daily., Disp: , Rfl:    carvedilol  (COREG ) 12.5 MG tablet, Take 1 tablet (12.5 mg total) by mouth 2 (two) times daily with a meal., Disp: 60 tablet, Rfl: 2   cetirizine (ZYRTEC) 10 MG tablet, Take 10 mg by mouth daily., Disp: , Rfl:    Cholecalciferol 2000 UNITS TABS, Take by mouth daily., Disp: , Rfl:    doxycycline  (VIBRA -TABS) 100 MG tablet, Take 1 tablet (100 mg total) by mouth every 12 (twelve) hours., Disp: 8 tablet, Rfl: 0   famotidine  (PEPCID ) 10 MG tablet, Take 2 tablets (20 mg total) by mouth at bedtime., Disp: , Rfl:    fluticasone-salmeterol (WIXELA INHUB) 250-50 MCG/ACT AEPB, Inhale 1 puff into the lungs in the morning and at  bedtime., Disp: , Rfl:    furosemide  (LASIX ) 20 MG tablet, Take 1 tablet (20 mg total) by mouth daily., Disp: 30 tablet, Rfl: 3   guaiFENesin  (MUCINEX ) 600 MG 12 hr tablet, Take 1 tablet (600 mg total) by mouth 2 (two) times daily., Disp: 10 tablet, Rfl: 0   hydrOXYzine (ATARAX) 10 MG tablet, Take 10 mg by mouth 3 (three) times daily as needed., Disp: , Rfl:    isosorbide  mononitrate (IMDUR ) 30 MG 24 hr tablet, Take 1 tablet (30 mg total) by mouth daily., Disp: 30 tablet, Rfl: 2   losartan  (COZAAR ) 50 MG tablet, Take 1 tablet (50 mg total) by mouth daily., Disp: 30 tablet, Rfl: 2   Omega-3 Fatty Acids (FISH OIL PO), Take 1 capsule by mouth 2 (two) times daily., Disp: , Rfl:    omeprazole (PRILOSEC) 20 MG capsule, Take 20 mg by mouth daily., Disp: , Rfl:    prazosin (MINIPRESS) 2 MG capsule, Take 2 mg by mouth at bedtime., Disp: , Rfl:    predniSONE  (DELTASONE ) 10 MG tablet, Prednisone  40 mg po daily x 1 day then Prednisone  30 mg po daily x 1 day then Prednisone  20 mg po daily x 1 day then Prednisone  10 mg daily x 1 day then stop..., Disp: 10 tablet, Rfl: 0   Tiotropium Bromide-Olodaterol (STIOLTO RESPIMAT) 2.5-2.5 MCG/ACT AERS, Inhale into the lungs  daily., Disp: , Rfl:    vitamin B-12 (CYANOCOBALAMIN) 50 MCG tablet, Take 50 mcg by mouth daily., Disp: , Rfl:   Past Medical History: Past Medical History:  Diagnosis Date   COPD (chronic obstructive pulmonary disease) (HCC)    Hypertension    PTSD (post-traumatic stress disorder) 05/04/1976    Tobacco Use: Social History   Tobacco Use  Smoking Status Former   Current packs/day: 0.00   Average packs/day: 0.5 packs/day for 2.0 years (1.0 ttl pk-yrs)   Types: Cigarettes   Start date: 04/03/1969   Quit date: 05/04/1970   Years since quitting: 53.4  Smokeless Tobacco Not on file    Labs: Review Flowsheet       Latest Ref Rng & Units 05/01/2008 02/15/2022 02/16/2022  Labs for ITP Cardiac and Pulmonary Rehab  Cholestrol 0 - 200 mg/dL - -  161   LDL (calc) 0 - 99 mg/dL - - 73   HDL-C >09 mg/dL - - 33   Trlycerides <604 mg/dL - - 540   Hemoglobin J8J 4.8 - 5.6 % - 5.4  -  TCO2 0 - 100 mmol/L 25  - -    Capillary Blood Glucose: No results found for: "GLUCAP"   Pulmonary Assessment Scores:  Pulmonary Assessment Scores     Row Name 05/03/23 1538         ADL UCSD   ADL Phase Entry     SOB Score total 100     Rest 2     Walk 3     Stairs 5     Bath 4     Dress 3     Shop 3       CAT Score   CAT Score 21       mMRC Score   mMRC Score 3             UCSD: Self-administered rating of dyspnea associated with activities of daily living (ADLs) 6-point scale (0 = "not at all" to 5 = "maximal or unable to do because of breathlessness")  Scoring Scores range from 0 to 120.  Minimally important difference is 5 units  CAT: CAT can identify the health impairment of COPD patients and is better correlated with disease progression.  CAT has a scoring range of zero to 40. The CAT score is classified into four groups of low (less than 10), medium (10 - 20), high (21-30) and very high (31-40) based on the impact level of disease on health status. A CAT score over 10 suggests significant symptoms.  A worsening CAT score could be explained by an exacerbation, poor medication adherence, poor inhaler technique, or progression of COPD or comorbid conditions.  CAT MCID is 2 points  mMRC: mMRC (Modified Medical Research Council) Dyspnea Scale is used to assess the degree of baseline functional disability in patients of respiratory disease due to dyspnea. No minimal important difference is established. A decrease in score of 1 point or greater is considered a positive change.   Pulmonary Function Assessment:  Pulmonary Function Assessment - 05/03/23 1537       Initial Spirometry Results   FVC% 82.5 %    FEV1% 39.4 %    FEV1/FVC Ratio 36    Comments 12/15/2022      Post Bronchodilator Spirometry Results   FVC% 102.5 %     FEV1% 40.2 %    FEV1/FVC Ratio 30    Comments 12/15/22      Breath   Shortness of Breath  Yes;Fear of Shortness of Breath;Limiting activity;Panic with Shortness of Breath             Exercise Target Goals: Exercise Program Goal: Individual exercise prescription set using results from initial 6 min walk test and THRR while considering  patient's activity barriers and safety.   Exercise Prescription Goal: Initial exercise prescription builds to 30-45 minutes a day of aerobic activity, 2-3 days per week.  Home exercise guidelines will be given to patient during program as part of exercise prescription that the participant will acknowledge.  Activity Barriers & Risk Stratification:  Activity Barriers & Cardiac Risk Stratification - 04/26/23 1323       Activity Barriers & Cardiac Risk Stratification   Activity Barriers Deconditioning;Shortness of Breath;Joint Problems   Left shoulder joint replaced 3 years ago.   Cardiac Risk Stratification Low             6 Minute Walk:  6 Minute Walk     Row Name 05/03/23 1531         6 Minute Walk   Phase Initial     Distance 940 feet     Walk Time 6 minutes     # of Rest Breaks 0     MPH 1.78     METS 2.15     RPE 14     Perceived Dyspnea  2     VO2 Peak 7.54     Symptoms Yes (comment)     Comments SOB, chest tightness 3/10, carrying small e-cylinder     Resting HR 68 bpm     Resting BP 118/66     Resting Oxygen Saturation  93 %     Exercise Oxygen Saturation  during 6 min walk 86 %     Max Ex. HR 106 bpm     Max Ex. BP 148/84     2 Minute Post BP 122/64       Interval HR   1 Minute HR 92     2 Minute HR 106     3 Minute HR 100     4 Minute HR 98     5 Minute HR 102     6 Minute HR 99     2 Minute Post HR 81     Interval Heart Rate? Yes       Interval Oxygen   Interval Oxygen? Yes     Baseline Oxygen Saturation % 93 %  1L NCC     1 Minute Oxygen Saturation % 90 %     1 Minute Liters of Oxygen 3 L  NCP     2  Minute Oxygen Saturation % 87 %     2 Minute Liters of Oxygen 3 L     3 Minute Oxygen Saturation % 86 %     3 Minute Liters of Oxygen 3 L     4 Minute Oxygen Saturation % 86 %     4 Minute Liters of Oxygen 3 L     5 Minute Oxygen Saturation % 87 %     5 Minute Liters of Oxygen 3 L     6 Minute Oxygen Saturation % 86 %     6 Minute Liters of Oxygen 3 L     2 Minute Post Oxygen Saturation % 94 %     2 Minute Post Liters of Oxygen 3 L              Oxygen Initial Assessment:  Oxygen  Initial Assessment - 05/03/23 1536       Home Oxygen   Home Oxygen Device Home Concentrator;Portable Concentrator;E-Tanks    Sleep Oxygen Prescription Continuous    Liters per minute 2    Home Exercise Oxygen Prescription Continuous    Liters per minute 3    Home Resting Oxygen Prescription Continuous    Liters per minute 2    Compliance with Home Oxygen Use Yes      Initial 6 min Walk   Oxygen Used Pulsed;E-Tanks    Liters per minute 3      Program Oxygen Prescription   Program Oxygen Prescription Continuous;E-Tanks    Liters per minute 3      Intervention   Short Term Goals To learn and exhibit compliance with exercise, home and travel O2 prescription;To learn and understand importance of monitoring SPO2 with pulse oximeter and demonstrate accurate use of the pulse oximeter.;To learn and understand importance of maintaining oxygen saturations>88%;To learn and demonstrate proper pursed lip breathing techniques or other breathing techniques. ;To learn and demonstrate proper use of respiratory medications    Long  Term Goals Exhibits compliance with exercise, home  and travel O2 prescription;Verbalizes importance of monitoring SPO2 with pulse oximeter and return demonstration;Maintenance of O2 saturations>88%;Exhibits proper breathing techniques, such as pursed lip breathing or other method taught during program session;Compliance with respiratory medication;Demonstrates proper use of MDI's              Oxygen Re-Evaluation:  Oxygen Re-Evaluation     Row Name 05/11/23 1516 05/18/23 1345 06/29/23 1507 07/27/23 1422 08/03/23 1408     Program Oxygen Prescription   Program Oxygen Prescription -- Continuous;E-Tanks Continuous;E-Tanks Continuous;E-Tanks Continuous;E-Tanks   Liters per minute -- -- 3 3 3    Comments -- -- -- Patient is being compliant with his home oxygen and c-pap. --     Home Oxygen   Home Oxygen Device -- Home Concentrator;Portable Concentrator;E-Tanks Home Concentrator;Portable Concentrator;E-Tanks Home Concentrator;Portable Concentrator;E-Tanks Home Concentrator;Portable Concentrator;E-Tanks   Sleep Oxygen Prescription -- Continuous Continuous Continuous Continuous   Liters per minute -- 2 2 2 2    Home Exercise Oxygen Prescription -- Continuous Continuous Continuous Continuous   Liters per minute -- 2.5 2.5 2 2    Home Resting Oxygen Prescription -- Continuous -- Continuous Continuous   Liters per minute -- 2 2 2 2    Compliance with Home Oxygen Use -- Yes Yes Yes Yes     Goals/Expected Outcomes   Short Term Goals To learn and understand importance of maintaining oxygen saturations>88%;To learn and understand importance of monitoring SPO2 with pulse oximeter and demonstrate accurate use of the pulse oximeter.;To learn and demonstrate proper pursed lip breathing techniques or other breathing techniques.  To learn and understand importance of maintaining oxygen saturations>88%;To learn and understand importance of monitoring SPO2 with pulse oximeter and demonstrate accurate use of the pulse oximeter.;To learn and demonstrate proper pursed lip breathing techniques or other breathing techniques. ;To learn and exhibit compliance with exercise, home and travel O2 prescription;To learn and demonstrate proper use of respiratory medications To learn and understand importance of maintaining oxygen saturations>88%;To learn and understand importance of monitoring SPO2 with pulse  oximeter and demonstrate accurate use of the pulse oximeter.;To learn and demonstrate proper pursed lip breathing techniques or other breathing techniques. ;To learn and exhibit compliance with exercise, home and travel O2 prescription;To learn and demonstrate proper use of respiratory medications To learn and understand importance of maintaining oxygen saturations>88%;To learn and understand importance of monitoring SPO2  with pulse oximeter and demonstrate accurate use of the pulse oximeter.;To learn and demonstrate proper pursed lip breathing techniques or other breathing techniques. ;To learn and exhibit compliance with exercise, home and travel O2 prescription;To learn and demonstrate proper use of respiratory medications To learn and understand importance of maintaining oxygen saturations>88%;To learn and understand importance of monitoring SPO2 with pulse oximeter and demonstrate accurate use of the pulse oximeter.;To learn and demonstrate proper pursed lip breathing techniques or other breathing techniques. ;To learn and exhibit compliance with exercise, home and travel O2 prescription;To learn and demonstrate proper use of respiratory medications   Long  Term Goals Maintenance of O2 saturations>88%;Verbalizes importance of monitoring SPO2 with pulse oximeter and return demonstration;Exhibits proper breathing techniques, such as pursed lip breathing or other method taught during program session Maintenance of O2 saturations>88%;Verbalizes importance of monitoring SPO2 with pulse oximeter and return demonstration;Exhibits proper breathing techniques, such as pursed lip breathing or other method taught during program session;Exhibits compliance with exercise, home  and travel O2 prescription;Demonstrates proper use of MDI's;Compliance with respiratory medication Maintenance of O2 saturations>88%;Verbalizes importance of monitoring SPO2 with pulse oximeter and return demonstration;Exhibits proper breathing  techniques, such as pursed lip breathing or other method taught during program session;Exhibits compliance with exercise, home  and travel O2 prescription;Demonstrates proper use of MDI's;Compliance with respiratory medication Maintenance of O2 saturations>88%;Verbalizes importance of monitoring SPO2 with pulse oximeter and return demonstration;Exhibits proper breathing techniques, such as pursed lip breathing or other method taught during program session;Exhibits compliance with exercise, home  and travel O2 prescription;Demonstrates proper use of MDI's;Compliance with respiratory medication Maintenance of O2 saturations>88%;Verbalizes importance of monitoring SPO2 with pulse oximeter and return demonstration;Exhibits proper breathing techniques, such as pursed lip breathing or other method taught during program session;Exhibits compliance with exercise, home  and travel O2 prescription;Demonstrates proper use of MDI's;Compliance with respiratory medication   Comments Reviewed PLB technique with pt.  Talked about how it works and it's importance in maintaining their exercise saturations. Chad Hensley is doing well in rehab.  He is good about wearing his oxygen when at home and out and about.  However, he does take it off to go to mailbox and gets winded quickly.  He was encourage to wear it even then to maintain saturations.  He is doing well on his medications  He is good about using his PLB. Chad Hensley is doing well in rehab. His is wearing his oxygen as precriped and not needing to turn it up during exercise. His oxygen is staying WNL when exercising. It will drop to the lower 90s when he increases his speed or level but with PLB he is able to bring it back up. Chad Hensley is doing well at home and during rehab with his oxygen. He states his breathing gets a little difficult at home when he is doing stuff, but that it is a lot better than it used to be. Chad Hensley is doing well in rehab.  He is doing well with his oxygen and breathing.  He is good about using his PLB and saturations are doing well.  He is feeling good on his medications.   Goals/Expected Outcomes Short: Become more profiecient at using PLB.   Long: Become independent at using PLB. Short: Wear O2 out to mailbox Long; Conitnue to monitor saturations closely. Short: Contineu to work on PLB when exercising  Long; Conitnue to monitor saturations closely. Short: Continue to monitor 02 sat's when exercising and use PLB techniques. Long: Monitor O2 sat's more closely at home.  Short: Conitnue to watch oxygen levels Long: Continued compliance    Row Name 09/17/23 1951             Program Oxygen Prescription   Program Oxygen Prescription Continuous;E-Tanks       Liters per minute 3         Home Oxygen   Home Oxygen Device Home Concentrator;Portable Concentrator;E-Tanks       Sleep Oxygen Prescription Continuous       Liters per minute 2       Home Exercise Oxygen Prescription Continuous       Home Resting Oxygen Prescription Continuous       Liters per minute 2       Compliance with Home Oxygen Use Yes         Goals/Expected Outcomes   Short Term Goals To learn and understand importance of maintaining oxygen saturations>88%;To learn and understand importance of monitoring SPO2 with pulse oximeter and demonstrate accurate use of the pulse oximeter.;To learn and demonstrate proper pursed lip breathing techniques or other breathing techniques. ;To learn and exhibit compliance with exercise, home and travel O2 prescription;To learn and demonstrate proper use of respiratory medications       Long  Term Goals Maintenance of O2 saturations>88%;Verbalizes importance of monitoring SPO2 with pulse oximeter and return demonstration;Exhibits proper breathing techniques, such as pursed lip breathing or other method taught during program session;Exhibits compliance with exercise, home  and travel O2 prescription;Demonstrates proper use of MDI's;Compliance with respiratory medication        Comments Chad Hensley is compliant with his oxygen and CPAP.  He sleeps like a baby with his CPAP.  He is doing well using the PLB and monitoring his saturations.  We did talk about it being okay to use nebulizer more than once a day when needed.       Goals/Expected Outcomes Short: Use nebulizer on bad breathing days Long: Conitnue to improve breathing                Oxygen Discharge (Final Oxygen Re-Evaluation):  Oxygen Re-Evaluation - 09/17/23 1951       Program Oxygen Prescription   Program Oxygen Prescription Continuous;E-Tanks    Liters per minute 3      Home Oxygen   Home Oxygen Device Home Concentrator;Portable Concentrator;E-Tanks    Sleep Oxygen Prescription Continuous    Liters per minute 2    Home Exercise Oxygen Prescription Continuous    Home Resting Oxygen Prescription Continuous    Liters per minute 2    Compliance with Home Oxygen Use Yes      Goals/Expected Outcomes   Short Term Goals To learn and understand importance of maintaining oxygen saturations>88%;To learn and understand importance of monitoring SPO2 with pulse oximeter and demonstrate accurate use of the pulse oximeter.;To learn and demonstrate proper pursed lip breathing techniques or other breathing techniques. ;To learn and exhibit compliance with exercise, home and travel O2 prescription;To learn and demonstrate proper use of respiratory medications    Long  Term Goals Maintenance of O2 saturations>88%;Verbalizes importance of monitoring SPO2 with pulse oximeter and return demonstration;Exhibits proper breathing techniques, such as pursed lip breathing or other method taught during program session;Exhibits compliance with exercise, home  and travel O2 prescription;Demonstrates proper use of MDI's;Compliance with respiratory medication    Comments Chad Hensley is compliant with his oxygen and CPAP.  He sleeps like a baby with his CPAP.  He is doing well using the PLB and monitoring his saturations.  We  did talk  about it being okay to use nebulizer more than once a day when needed.    Goals/Expected Outcomes Short: Use nebulizer on bad breathing days Long: Conitnue to improve breathing             Initial Exercise Prescription:  Initial Exercise Prescription - 05/03/23 1500       Date of Initial Exercise RX and Referring Provider   Date 05/03/23    Referring Provider [facility]     Oxygen   Oxygen Continuous    Liters 3    Maintain Oxygen Saturation 88% or higher      NuStep   Level 1    SPM 80    Minutes 15    METs 2.1      REL-XR   Level 1    Speed 50    Minutes 15    METs 2.1      Track   Laps 10    Minutes 15    METs 2      Prescription Details   Frequency (times per week) 2    Duration Progress to 30 minutes of continuous aerobic without signs/symptoms of physical distress      Intensity   THRR 40-80% of Max Heartrate 91-131    Ratings of Perceived Exertion 11-13    Perceived Dyspnea 0-4      Progression   Progression Continue to progress workloads to maintain intensity without signs/symptoms of physical distress.      Resistance Training   Training Prescription Yes    Weight 3 lb    Reps 10-15             Perform Capillary Blood Glucose checks as needed.  Exercise Prescription Changes:   Exercise Prescription Changes     Row Name 05/03/23 1500 05/27/23 1400 06/22/23 1500 07/27/23 1500 08/19/23 1500     Response to Exercise   Blood Pressure (Admit) 118/66 140/78 140/78 160/88 138/78   Blood Pressure (Exercise) 148/84 152/90 124/84 -- --   Blood Pressure (Exit) 112/66 140/70 142/78 130/80 130/84   Heart Rate (Admit) 68 bpm 77 bpm 85 bpm 93 bpm 73 bpm   Heart Rate (Exercise) 106 bpm 97 bpm 97 bpm 103 bpm 94 bpm   Heart Rate (Exit) 80 bpm 76 bpm 82 bpm 100 bpm 85 bpm   Oxygen Saturation (Admit) 93 % 93 % 94 % 93 % 98 %   Oxygen Saturation (Exercise) 86 % 95 % 95 % 93 % 96 %   Oxygen Saturation (Exit) 93 % 95 % 94 % 93 % 90  %   Rating of Perceived Exertion (Exercise) 14 13 13 13 14    Perceived Dyspnea (Exercise) 2 3 2 2 2    Symptoms SOB, chest tightness 3/10 -- -- -- --   Comments walk test results -- -- -- --   Duration -- Continue with 30 min of aerobic exercise without signs/symptoms of physical distress. Continue with 30 min of aerobic exercise without signs/symptoms of physical distress. Continue with 30 min of aerobic exercise without signs/symptoms of physical distress. Continue with 30 min of aerobic exercise without signs/symptoms of physical distress.   Intensity -- THRR unchanged THRR unchanged THRR unchanged THRR unchanged     Progression   Progression -- Continue to progress workloads to maintain intensity without signs/symptoms of physical distress. Continue to progress workloads to maintain intensity without signs/symptoms of physical distress. Continue to progress workloads to maintain intensity without  signs/symptoms of physical distress. Continue to progress workloads to maintain intensity without signs/symptoms of physical distress.     Resistance Training   Training Prescription -- Yes Yes Yes Yes   Weight -- 3 4 4 4    Reps -- 10-15 10-15 10-15 10-15     Oxygen   Oxygen -- Continuous Continuous Continuous Continuous   Liters -- 3 3 4 3      NuStep   Level -- 3 3 2 2    SPM -- 78 91 80 84   Minutes -- 15 15 15 15    METs -- 1.8 2 1.9 1.9     Arm Ergometer   Level -- -- -- 1 2   Watts -- -- -- 50 52   Minutes -- -- -- 15 15   METs -- -- -- 1.8 1.9     REL-XR   Level -- 3 3 -- --   Speed -- 42 43 -- --   Minutes -- 15 15 -- --   METs -- 2 2.1 -- --     Oxygen   Maintain Oxygen Saturation -- 88% or higher 88% or higher 88% or higher 88% or higher    Row Name 09/07/23 1500             Response to Exercise   Blood Pressure (Admit) 146/72       Blood Pressure (Exit) 136/70       Heart Rate (Admit) 88 bpm       Heart Rate (Exercise) 97 bpm       Heart Rate (Exit) 91 bpm        Oxygen Saturation (Admit) 93 %       Oxygen Saturation (Exercise) 94 %       Oxygen Saturation (Exit) 97 %       Rating of Perceived Exertion (Exercise) 12       Perceived Dyspnea (Exercise) 2       Duration Continue with 30 min of aerobic exercise without signs/symptoms of physical distress.       Intensity THRR unchanged         Progression   Progression Continue to progress workloads to maintain intensity without signs/symptoms of physical distress.         Resistance Training   Training Prescription Yes       Weight 5       Reps 10-15         Oxygen   Oxygen Continuous       Liters 3         NuStep   Level 3       SPM 80       Minutes 15       METs 2         Arm Ergometer   Level 2       Watts 52       Minutes 15       METs 1.9         Oxygen   Maintain Oxygen Saturation 88% or higher                Exercise Comments:   Exercise Comments     Row Name 05/11/23 1515 07/27/23 1403         Exercise Comments First full day of exercise!  Patient was oriented to gym and equipment including functions, settings, policies, and procedures.  Patient's individual exercise prescription and treatment plan were reviewed.  All starting workloads  were established based on the results of the 6 minute walk test done at initial orientation visit.  The plan for exercise progression was also introduced and progression will be customized based on patient's performance and goals. Chad Hensley returned today. He had been out with a sore knee as it popped in class when standing the last time he was here at the beginning of the month. He strained the tendons and may have small tear but was told he could exercise with a supportive knee brace on during class.               Exercise Goals and Review:   Exercise Goals     Row Name 05/03/23 1534             Exercise Goals   Increase Physical Activity Yes       Intervention Provide advice, education, support and counseling about  physical activity/exercise needs.;Develop an individualized exercise prescription for aerobic and resistive training based on initial evaluation findings, risk stratification, comorbidities and participant's personal goals.       Expected Outcomes Short Term: Attend rehab on a regular basis to increase amount of physical activity.;Long Term: Add in home exercise to make exercise part of routine and to increase amount of physical activity.;Long Term: Exercising regularly at least 3-5 days a week.       Increase Strength and Stamina Yes       Intervention Provide advice, education, support and counseling about physical activity/exercise needs.;Develop an individualized exercise prescription for aerobic and resistive training based on initial evaluation findings, risk stratification, comorbidities and participant's personal goals.       Expected Outcomes Short Term: Increase workloads from initial exercise prescription for resistance, speed, and METs.;Short Term: Perform resistance training exercises routinely during rehab and add in resistance training at home;Long Term: Improve cardiorespiratory fitness, muscular endurance and strength as measured by increased METs and functional capacity ( )       Able to understand and use rate of perceived exertion (RPE) scale Yes       Intervention Provide education and explanation on how to use RPE scale       Expected Outcomes Short Term: Able to use RPE daily in rehab to express subjective intensity level;Long Term:  Able to use RPE to guide intensity level when exercising independently       Able to understand and use Dyspnea scale Yes       Intervention Provide education and explanation on how to use Dyspnea scale       Expected Outcomes Short Term: Able to use Dyspnea scale daily in rehab to express subjective sense of shortness of breath during exertion;Long Term: Able to use Dyspnea scale to guide intensity level when exercising independently       Knowledge  and understanding of Target Heart Rate Range (THRR) Yes       Intervention Provide education and explanation of THRR including how the numbers were predicted and where they are located for reference       Expected Outcomes Short Term: Able to state/look up THRR;Long Term: Able to use THRR to govern intensity when exercising independently;Short Term: Able to use daily as guideline for intensity in rehab       Able to check pulse independently Yes       Intervention Review the importance of being able to check your own pulse for safety during independent exercise;Provide education and demonstration on how to check pulse in carotid and radial arteries.  Expected Outcomes Short Term: Able to explain why pulse checking is important during independent exercise;Long Term: Able to check pulse independently and accurately       Understanding of Exercise Prescription Yes       Intervention Provide education, explanation, and written materials on patient's individual exercise prescription       Expected Outcomes Short Term: Able to explain program exercise prescription;Long Term: Able to explain home exercise prescription to exercise independently                Exercise Goals Re-Evaluation :  Exercise Goals Re-Evaluation     Row Name 05/11/23 1515 05/18/23 1332 06/29/23 1502 07/27/23 1424 07/29/23 0903     Exercise Goal Re-Evaluation   Exercise Goals Review Able to understand and use rate of perceived exertion (RPE) scale;Knowledge and understanding of Target Heart Rate Range (THRR);Understanding of Exercise Prescription;Able to understand and use Dyspnea scale Increase Physical Activity;Increase Strength and Stamina;Understanding of Exercise Prescription Increase Physical Activity;Increase Strength and Stamina;Understanding of Exercise Prescription Increase Physical Activity;Increase Strength and Stamina;Able to understand and use Dyspnea scale;Knowledge and understanding of Target Heart Rate Range  (THRR);Able to understand and use rate of perceived exertion (RPE) scale;Able to check pulse independently;Understanding of Exercise Prescription Increase Physical Activity;Increase Strength and Stamina;Understanding of Exercise Prescription   Comments Reviewed RPE and dyspnea scale, THR and program prescription with pt today.  Pt voiced understanding and was given a copy of goals to take home. Chad Hensley is off to a good start in rehab.  He is already starting to feel better.  He finds that he is no longer sitting as much.  He does note some dry mouth and was encouraged to keep drinking water and exercising regularly.  He does have some weights and has already started to use them at home.  We will review home exercise after a few more sessions. Chad Hensley is doing well in rehab. He is increasing his levels on the XR and Nustep and is tolerationg the resistance well. He has noticed his SOB getting better when exercising. Kavi states he is doing some exercise at home such as exercising his R leg that was injured, lifting 5 pound weights and walking around outside. Javion is doing well in rehab. He recenty injured his R knee and had to move to the arm erogmeter instrad of using the seated elliptical.   Expected Outcomes Short: Use RPE daily to regulate intensity.  Long: Follow program prescription in THR. Short: Conitnue to move more Long: Continue to attend rehab regularly. Short: Conitnue to move more Long: Continue to attend rehab regularly. Short: Continue to move more. Long: Continue to attend rehab regularly. Continue to attend rehab    Row Name 08/03/23 1402 09/17/23 1947           Exercise Goal Re-Evaluation   Exercise Goals Review Increase Physical Activity;Increase Strength and Stamina;Understanding of Exercise Prescription Increase Physical Activity;Increase Strength and Stamina;Understanding of Exercise Prescription      Comments Chad Hensley is doing well in rehab.  He is working on getting back after hurting his  knee.  It is still bothering him some and he has a MRI scheduled soon to get a better look at it since it is still causing some pain on the back side.  He is doing leg exercises to strenghten at home.  He is also using 5 lb weights for upper body. Chad Hensley is doing well in rehab. He has not been able to walk as much, but getting there.  He is doing leg exercises and using weights at home still.  He was encouraged to try some chair exercises as well      Expected Outcomes Short: Find out more about knee Long: COnitnue to exercise to build stamina SHort: Try out chair exercises Long Continue to improve stamina               Discharge Exercise Prescription (Final Exercise Prescription Changes):  Exercise Prescription Changes - 09/07/23 1500       Response to Exercise   Blood Pressure (Admit) 146/72    Blood Pressure (Exit) 136/70    Heart Rate (Admit) 88 bpm    Heart Rate (Exercise) 97 bpm    Heart Rate (Exit) 91 bpm    Oxygen Saturation (Admit) 93 %    Oxygen Saturation (Exercise) 94 %    Oxygen Saturation (Exit) 97 %    Rating of Perceived Exertion (Exercise) 12    Perceived Dyspnea (Exercise) 2    Duration Continue with 30 min of aerobic exercise without signs/symptoms of physical distress.    Intensity THRR unchanged      Progression   Progression Continue to progress workloads to maintain intensity without signs/symptoms of physical distress.      Resistance Training   Training Prescription Yes    Weight 5    Reps 10-15      Oxygen   Oxygen Continuous    Liters 3      NuStep   Level 3    SPM 80    Minutes 15    METs 2      Arm Ergometer   Level 2    Watts 52    Minutes 15    METs 1.9      Oxygen   Maintain Oxygen Saturation 88% or higher             Nutrition:  Target Goals: Understanding of nutrition guidelines, daily intake of sodium 1500mg , cholesterol 200mg , calories 30% from fat and 7% or less from saturated fats, daily to have 5 or more servings of  fruits and vegetables.  Biometrics:  Pre Biometrics - 05/03/23 1534       Pre Biometrics   Height 5\' 6"  (1.676 m)    Weight 184 lb 8 oz (83.7 kg)    Waist Circumference 40.5 inches    Hip Circumference 40 inches    Waist to Hip Ratio 1.01 %    BMI (Calculated) 29.79    Grip Strength 32.1 kg    Single Leg Stand 4.4 seconds              Nutrition Therapy Plan and Nutrition Goals:  Nutrition Therapy & Goals - 05/03/23 1535       Intervention Plan   Intervention Prescribe, educate and counsel regarding individualized specific dietary modifications aiming towards targeted core components such as weight, hypertension, lipid management, diabetes, heart failure and other comorbidities.;Nutrition handout(s) given to patient.    Expected Outcomes Short Term Goal: Understand basic principles of dietary content, such as calories, fat, sodium, cholesterol and nutrients.;Long Term Goal: Adherence to prescribed nutrition plan.             Nutrition Assessments:  MEDIFICTS Score Key: >=70 Need to make dietary changes  40-70 Heart Healthy Diet <= 40 Therapeutic Level Cholesterol Diet  Flowsheet Row PULMONARY REHAB COPD ORIENTATION from 05/03/2023 in [facility]  Picture Your Plate Total Score on Admission 59      Picture Your  Plate Scores: <16 Unhealthy dietary pattern with much room for improvement. 41-50 Dietary pattern unlikely to meet recommendations for good health and room for improvement. 51-60 More healthful dietary pattern, with some room for improvement.  >60 Healthy dietary pattern, although there may be some specific behaviors that could be improved.    Nutrition Goals Re-Evaluation:  Nutrition Goals Re-Evaluation     Row Name 05/18/23 1339 06/29/23 1504 07/27/23 1418 08/03/23 1405 09/17/23 1949     Goals   Nutrition Goal Healthy Diet Healthy Diet Healthy Diet Short: continue to eat healthy. Long: Aim for a variety in foods. Short;  Conitnue to add variety Long: Continue to eat healthy   Comment Chad Hensley is working on his diet.  He tries to stick to it as much as possible.  He aims to stay away from sweets and soda.  He wants to work on losing weight. He is doing well with a variety of fruits and vegetables.  He tries to get good proteins.  He really enjoys baked/broiled chicken with vegetables.  He is not a big breakfast eater and usually gets about two meals a day. Chad Hensley is continuing to work on his diet and  is working on losing weight. Chad Hensley states he is currently eating healthy. Chad Hensley continues to eat healthy.  He usually gets two main meals a day and both include protein.  He likes to do an egg salad sandwich for lunch and a big salad for dinner.  He loves to mix up his salads each day to get a good variety. Chad Hensley is doing better with eating.  He is getting  a good variety and adding in more protein.  He is pleased with his progress on getting more protein.   Expected Outcome Short: Conitnue to cut back on sweets Long: Aim for a variety. Short: Conitnue to cut back on sweets Long: Aim for a variety. Short: continue to eat healthy. Long: Aim for a variety in foods. Short; Conitnue to add variety Long: Continue to eat healthy Short; More vareity in protein Long: Continue to eat healthy            Nutrition Goals Discharge (Final Nutrition Goals Re-Evaluation):  Nutrition Goals Re-Evaluation - 09/17/23 1949       Goals   Nutrition Goal Short; Conitnue to add variety Long: Continue to eat healthy    Comment Jazen is doing better with eating.  He is getting  a good variety and adding in more protein.  He is pleased with his progress on getting more protein.    Expected Outcome Short; More vareity in protein Long: Continue to eat healthy             Psychosocial: Target Goals: Acknowledge presence or absence of significant depression and/or stress, maximize coping skills, provide positive support system. Participant is able to  verbalize types and ability to use techniques and skills needed for reducing stress and depression.  Initial Review & Psychosocial Screening:  Initial Psych Review & Screening - 04/26/23 1340       Initial Review   Current issues with Current Sleep Concerns;Current Psychotropic Meds;History of Depression;Current Depression   PSTD with visual hallucinations     Family Dynamics   Good Support System? Yes    Comments His 2 daughters are his support.      Barriers   Psychosocial barriers to participate in program The patient should benefit from training in stress management and relaxation.      Screening Interventions  Interventions Encouraged to exercise;To provide support and resources with identified psychosocial needs;Provide feedback about the scores to participant    Expected Outcomes Short Term goal: Utilizing psychosocial counselor, staff and physician to assist with identification of specific Stressors or current issues interfering with healing process. Setting desired goal for each stressor or current issue identified.;Long Term Goal: Stressors or current issues are controlled or eliminated.;Short Term goal: Identification and review with participant of any Quality of Life or Depression concerns found by scoring the questionnaire.;Long Term goal: The participant improves quality of Life and PHQ9 Scores as seen by post scores and/or verbalization of changes             Quality of Life Scores:  Scores of 19 and below usually indicate a poorer quality of life in these areas.  A difference of  2-3 points is a clinically meaningful difference.  A difference of 2-3 points in the total score of the Quality of Life Index has been associated with significant improvement in overall quality of life, self-image, physical symptoms, and general health in studies assessing change in quality of life.   PHQ-9: Review Flowsheet       08/03/2023 07/06/2023 05/18/2023 05/03/2023  Depression screen  PHQ 2/9  Decreased Interest 0 1 1 2   Down, Depressed, Hopeless 0 1 0 2  PHQ - 2 Score 0 2 1 4   Altered sleeping 1 0 0 2  Tired, decreased energy 1 1 3 3   Change in appetite 0 1 0 2  Feeling bad or failure about yourself  0 0 0 2  Trouble concentrating 1 1 2 3   Moving slowly or fidgety/restless 1 1 2 2   Suicidal thoughts 0 0 0 0  PHQ-9 Score 4 6 8 18   Difficult doing work/chores Somewhat difficult Somewhat difficult Somewhat difficult Very difficult   Interpretation of Total Score  Total Score Depression Severity:  1-4 = Minimal depression, 5-9 = Mild depression, 10-14 = Moderate depression, 15-19 = Moderately severe depression, 20-27 = Severe depression   Psychosocial Evaluation and Intervention:  Psychosocial Evaluation - 04/26/23 1343       Psychosocial Evaluation & Interventions   Interventions Stress management education;Relaxation education;Encouraged to exercise with the program and follow exercise prescription    Comments Patient was referred to [facility] with COPD from the [place]. He served in [place] and does report some depression as well as PTSD with occasionally visual and auditory hallucinations which he says is treated with Buspar. He says his daughter was living with him but she has moved out and he now lives alone which has been a diffucult ajustment for him. He has 2 daughters that do support him with his needs. He says he does attend church which helps him socially. He reports trouble staying asleep mainly due to feelings of choking on sinus drainage. He used to work painting houses and he thinks the fumes from the paint and exposure to chemicals while serving in the [employer] damaged his lungs. He is walking some at [facility] but says he can't do a lot. His goals for the program are to improve his strength and stamina and breathing. He has no barriers identified to completing the program.    Expected Outcomes Short Term: start the program and attend consistently. Long Term: meet his  personal goals.    Continue Psychosocial Services  Follow up required by staff             Psychosocial Re-Evaluation:  Psychosocial Re-Evaluation  Row Name 05/18/23 1334 06/29/23 1504 07/07/23 1510 07/27/23 1417 08/03/23 1404     Psychosocial Re-Evaluation   Current issues with Current Stress Concerns;Current Depression;Current Psychotropic Meds Current Stress Concerns;Current Depression;Current Psychotropic Meds History of Depression History of Depression History of Depression   Comments Sedric is doing well in rehab so far.  He is still seeing his counselor at the [place] every month for his PTSD.  He s already feeling better as he is moving more.  He is sleeping well for most part.  Overall, he is feeling better.  His PHQ has improved by 10 pts already, we will continue to check in with him.  He tries not to dwell on things too much and enjoyed stress education last week. Chad Hensley is doing well in rehab. He continuse to see his [place]. He is overall feeling better with exercise. Reviewed patient health questionnaire (PHQ-9) with patient for follow up. Previously, patient's score indicated signs/symptoms of depression. Reviewed to see if patient is improving symptom wise while in program. Score declined, when reading the questions to the pt I asked each questions different ways so the pt could understand what was being asked. Pt did not fully understand each question. Pt not having any current stressors, just his history of depression that he keeps taking his meds for. Reviewed patient health questionnaire (PHQ-9) with patient for follow up. Previously, patients score indicated signs/symptoms of depression.  Reviewed to see if patient is improving symptom wise while in program.  Score improved and patient states that it is because they have been moving more and sleeping better.   Expected Outcomes Short: Continue to exercise for mental boost Long: conitnue to work with PTSD Short: Continue to  exercise for mental boost Long: conitnue to work with PTSD Short: Continue to work toward an improvement in PHQ9 scores by attending rehab regularly. Long: Continue to improve stress and depression coping skills by talking with staff and attending rehab regularly and work toward a positive mental state. Short: Continue to attend rehab. Long: Continue to improve stress levels. Short: Continue to attend rehab egularly for regular exercise and social engagement. Long: Continue to improve symptoms and manage a positive mental state.   Interventions -- -- Encouraged to attend Pulmonary Rehabilitation for the exercise Stress management education;Relaxation education;Encouraged to attend Pulmonary Rehabilitation for the exercise Stress management education;Relaxation education;Encouraged to attend Pulmonary Rehabilitation for the exercise   Continue Psychosocial Services  -- -- Follow up required by staff Follow up required by staff Follow up required by staff    Row Name 09/17/23 1948             Psychosocial Re-Evaluation   Current issues with History of Depression       Comments Chad Hensley is doing well in rehab.  He feels that he is in a better place mentally.  He is taking it one day at a time.  He is still frustrated with not being able to go and do as he used to, but better at coping with it now.       Expected Outcomes Short: Continue to attend rehab egularly for regular exercise and social engagement. Long: Continue to improve symptoms and manage a positive mental state.       Interventions Stress management education;Relaxation education;Encouraged to attend Pulmonary Rehabilitation for the exercise       Continue Psychosocial Services  Follow up required by staff  Psychosocial Discharge (Final Psychosocial Re-Evaluation):  Psychosocial Re-Evaluation - 09/17/23 1948       Psychosocial Re-Evaluation   Current issues with History of Depression    Comments Chad Hensley is doing well in  rehab.  He feels that he is in a better place mentally.  He is taking it one day at a time.  He is still frustrated with not being able to go and do as he used to, but better at coping with it now.    Expected Outcomes Short: Continue to attend rehab egularly for regular exercise and social engagement. Long: Continue to improve symptoms and manage a positive mental state.    Interventions Stress management education;Relaxation education;Encouraged to attend Pulmonary Rehabilitation for the exercise    Continue Psychosocial Services  Follow up required by staff              Education: Education Goals: Education classes will be provided on a weekly basis, covering required topics. Participant will state understanding/return demonstration of topics presented.  Learning Barriers/Preferences:  Learning Barriers/Preferences - 04/26/23 1327       Learning Barriers/Preferences   Learning Barriers None    Learning Preferences Skilled Demonstration;Audio;Written Material             Education Topics: How Lungs Work and Diseases: - Discuss the anatomy of the lungs and diseases that can affect the lungs, such as COPD. Flowsheet Row PULMONARY REHAB CHRONIC OBSTRUCTIVE PULMONARY DISEASE from 09/16/2023 in [facility] [place] [facility]  Date 06/03/23  Educator jh  Instruction Review Code 1- Verbalizes Understanding       Exercise: -Discuss the importance of exercise, FITT principles of exercise, normal and abnormal responses to exercise, and how to exercise safely.   Environmental Irritants: -Discuss types of environmental irritants and how to limit exposure to environmental irritants.   Meds/Inhalers and oxygen: - Discuss respiratory medications, definition of an inhaler and oxygen, and the proper way to use an inhaler and oxygen.   Energy Saving Techniques: - Discuss methods to conserve energy and decrease shortness of breath when performing activities of daily living.     Bronchial Hygiene / Breathing Techniques: - Discuss breathing mechanics, pursed-lip breathing technique,  proper posture, effective ways to clear airways, and other functional breathing techniques   Cleaning Equipment: - Provides group verbal and written instruction about the health risks of elevated stress, cause of high stress, and healthy ways to reduce stress.   Nutrition I: Fats: - Discuss the types of cholesterol, what cholesterol does to the body, and how cholesterol levels can be controlled. Flowsheet Row PULMONARY REHAB CHRONIC OBSTRUCTIVE PULMONARY DISEASE from 09/16/2023 in [place] [facility]  Date 09/09/23  Educator HB  Instruction Review Code 1- Verbalizes Understanding       Nutrition II: Labels: -Discuss the different components of food labels and how to read food labels. Flowsheet Row PULMONARY REHAB CHRONIC OBSTRUCTIVE PULMONARY DISEASE from 09/16/2023 in [place] [facility]  Date 09/09/23  Educator HB  Instruction Review Code 1- Verbalizes Understanding       Respiratory Infections: - Discuss the signs and symptoms of respiratory infections, ways to prevent respiratory infections, and the importance of seeking medical treatment when having a respiratory infection.   Stress I: Signs and Symptoms: - Discuss the causes of stress, how stress may lead to anxiety and depression, and ways to limit stress. Flowsheet Row PULMONARY REHAB CHRONIC OBSTRUCTIVE PULMONARY DISEASE from 09/16/2023 in [place] [facility]  Date 05/13/23  Educator [facility]  Instruction Review Code 1- Verbalizes Understanding       Stress II: Relaxation: -Discuss relaxation techniques to limit stress. Flowsheet Row PULMONARY REHAB CHRONIC OBSTRUCTIVE PULMONARY DISEASE from 09/16/2023 in [place] [facility] REHABILITATION  Date 07/01/23  [facility]  Instruction Review Code 1- Verbalizes Understanding       Oxygen for Home/Travel: - Discuss how to  prepare for travel when on oxygen and proper ways to transport and store oxygen to ensure safety.   Knowledge Questionnaire Score:  Knowledge Questionnaire Score - 05/03/23 1535       Knowledge Questionnaire Score   Pre Score 11/18             Core Components/Risk Factors/Patient Goals at Admission:  Personal Goals and Risk Factors at Admission - 05/03/23 1535       Core Components/Risk Factors/Patient Goals on Admission    Weight Management Yes;Weight Loss    Intervention Weight Management: Develop a combined nutrition and exercise program designed to reach desired caloric intake, while maintaining appropriate intake of nutrient and fiber, sodium and fats, and appropriate energy expenditure required for the weight goal.;Weight Management: Provide education and appropriate resources to help participant work on and attain dietary goals.;Weight Management/Obesity: Establish reasonable short term and long term weight goals.    Admit Weight 184 lb 8 oz (83.7 kg)    Goal Weight: Short Term 179 lb (81.2 kg)    Goal Weight: Long Term 165 lb (74.8 kg)    Expected Outcomes Short Term: Continue to assess and modify interventions until short term weight is achieved;Long Term: Adherence to nutrition and physical activity/exercise program aimed toward attainment of established weight goal;Weight Loss: Understanding of general recommendations for a balanced deficit meal plan, which promotes 1-2 lb weight loss per week and includes a negative energy balance of [phone] kcal/d;Understanding recommendations for meals to include 15-35% energy as protein, 25-35% energy from fat, 35-60% energy from carbohydrates, less than 200mg  of dietary cholesterol, 20-35 gm of total fiber daily;Understanding of distribution of calorie intake throughout the day with the consumption of 4-5 meals/snacks    Improve shortness of breath with ADL's Yes    Expected Outcomes Short Term: Improve cardiorespiratory fitness to  achieve a reduction of symptoms when performing ADLs;Long Term: Be able to perform more ADLs without symptoms or delay the onset of symptoms    Increase knowledge of respiratory medications and ability to use respiratory devices properly  Yes    Intervention Provide education and demonstration as needed of appropriate use of medications, inhalers, and oxygen therapy.    Expected Outcomes Short Term: Achieves understanding of medications use. Understands that oxygen is a medication prescribed by physician. Demonstrates appropriate use of inhaler and oxygen therapy.;Long Term: Maintain appropriate use of medications, inhalers, and oxygen therapy.    Intervention Provide education on lifestyle modifcations including regular physical activity/exercise, weight management, moderate sodium restriction and increased consumption of fresh fruit, vegetables, and low fat dairy, alcohol moderation, and smoking cessation.;Monitor prescription use compliance.    Expected Outcomes Short Term: Continued assessment and intervention until BP is < 140/2mm HG in hypertensive participants. < 130/6mm HG in hypertensive participants with diabetes, heart failure or chronic kidney disease.;Long Term: Maintenance of blood pressure at goal levels.    Lipids Yes    Intervention Provide education and support for participant on nutrition & aerobic/resistive exercise along with prescribed medications to achieve LDL 70mg , HDL >40mg .    Expected Outcomes Short Term: Participant states understanding of desired cholesterol values and  is compliant with medications prescribed. Participant is following exercise prescription and nutrition guidelines.;Long Term: Cholesterol controlled with medications as prescribed, with individualized exercise RX and with personalized nutrition plan. Value goals: LDL < 70mg , HDL > 40 mg.             Core Components/Risk Factors/Patient Goals Review:   Goals and Risk Factor Review     Row Name  05/18/23 1341 06/29/23 1505 07/27/23 1419 08/03/23 1406 09/17/23 1950     Core Components/Risk Factors/Patient Goals Review   Personal Goals Review Weight Management/Obesity;Improve shortness of breath with ADL's;Increase knowledge of respiratory medications and ability to use respiratory devices properly.;Hypertension;Lipids Weight Management/Obesity;Improve shortness of breath with ADL's;Increase knowledge of respiratory medications and ability to use respiratory devices properly.;Hypertension;Lipids Weight Management/Obesity;Improve shortness of breath with ADL's;Increase knowledge of respiratory medications and ability to use respiratory devices properly.;Hypertension;Lipids Weight Management/Obesity;Improve shortness of breath with ADL's;Increase knowledge of respiratory medications and ability to use respiratory devices properly.;Hypertension;Lipids Weight Management/Obesity;Improve shortness of breath with ADL's;Increase knowledge of respiratory medications and ability to use respiratory devices properly.;Hypertension;Lipids   Review Chad Hensley is doing well in rehab.  He wants to lose more weight is working on continuing to improve his diet to help.  His breathing has gotten better as he is not sitting as much anymore.  His blood pressures are doing well and he does check them at home.  He keeps a record to show his doctors regularly.  He was at [facility] yesterday for a general f/u and is doing well.  His breathing is doing well, but has some rougher days.  He is doing well with his meds.  He is good about wearing his oxygen when out and about but not out to [employer].  He was encouraged to wear it regularly to maintain saturations. Maynor continues to do well in rehab. He is watching what he eats so he is able to lose weight and improve his diet. His blood pressure continues to do well in rehab and his oxygen is staying WNL during exercise. His is working on his PLB when in rehab=. Taurean is coming back from being out of  rehab due to a knee injury. He is taking all of his medications as prescribed, especially his respiratory medications. He is eager to continue working in rehab. Navid continues to work on [employer] loss.  He is also watching his knee and has an MRI scheduled.  His breathing is doing better overall.  His pressures are still running on the higher side but he is keeping eye on it and his daughter is also watching it too. Ellison is doing well.  His weight is trending down.  His knee is feeling better.  His breathing has improved and pressures have leveled back out.   Expected Outcomes Short: Continue to work on weight loss.  Long: Continue to montior risk factors. Short: Continue to work on weight loss.  Long: Continue to montior risk factors. Short: Continue to work on weight loss and building strength.  Long: Continue to monitor risk factors. Short: Continue to work on blood pressures Long: Conitnue to monitor risk factors. Short; Continue to keep close eye on pressures Long: contineu to improve breathing            Core Components/Risk Factors/Patient Goals at Discharge (Final Review):   Goals and Risk Factor Review - 09/17/23 1950       Core Components/Risk Factors/Patient Goals Review   Personal Goals Review Weight Management/Obesity;Improve shortness of breath with ADL's;Increase knowledge  of respiratory medications and ability to use respiratory devices properly.;Hypertension;Lipids    Review Bardia is doing well.  His weight is trending down.  His knee is feeling better.  His breathing has improved and pressures have leveled back out.    Expected Outcomes Short; Continue to keep close eye on pressures Long: contineu to improve breathing             ITP Comments:  ITP Comments     Row Name 05/03/23 1530 05/04/23 1436 05/11/23 1515 06/02/23 1138 06/30/23 1349   ITP Comments Patient attend orientation today.  Patient is attending Pulmonary Rehabilitation Program.  Documentation for diagnosis can  be found in [place] notes in [place] for today's appointment.  Reviewed medical chart, RPE/RPD, gym safety, and program guidelines.  Patient was fitted to equipment they will be using during rehab.  Patient is scheduled to start exercise on Tues 05/11/23 at 1330.   Initial ITP created and sent for review and signature by Dr. Gwendalyn Lemma, Medical Director for Pulmonary Rehabilitation Program. 30 day review completed. ITP sent to Dr.Jehanzeb Memon, Medical Director of  Pulmonary Rehab. Continue with ITP unless changes are made by physician.  Only attended orientation yesterday. First full day of exercise!  Patient was oriented to gym and equipment including functions, settings, policies, and procedures.  Patient's individual exercise prescription and treatment plan were reviewed.  All starting workloads were established based on the results of the 6 minute walk test done at initial orientation visit.  The plan for exercise progression was also introduced and progression will be customized based on patient's performance and goals. 30 day review completed. ITP sent to Dr.Jehanzeb Memon, Medical Director of  Pulmonary Rehab. Continue with ITP unless changes are made by physician. 30 day review completed. ITP sent to Dr.Jehanzeb Memon, Medical Director of  Pulmonary Rehab. Continue with ITP unless changes are made by physician.    Row Name 07/27/23 1401 07/28/23 1151 08/25/23 1452 09/22/23 1133     ITP Comments Peyton returned today.  He had been out with a sore knee as it popped in class when standing the last time he was here at the beginning of the month.  He strained the tendons and may  have small tear but was told he could exercise with a supportive knee brace on during class. 30 day review completed. ITP sent to Dr.Jehanzeb Memon, Medical Director of  Pulmonary Rehab. Continue with ITP unless changes are made by physician. 30 day review completed. ITP sent to Dr.Jehanzeb Memon, Medical Director of  Pulmonary Rehab.  Continue with ITP unless changes are made by physician. 30 day review completed. ITP sent to Dr.Jehanzeb Memon, Medical Director of  Pulmonary Rehab. Continue with ITP unless changes are made by physician.             Comments: 30 day review

## 2023-09-23 ENCOUNTER — Encounter (HOSPITAL_COMMUNITY)
Admission: RE | Admit: 2023-09-23 | Discharge: 2023-09-23 | Disposition: A | Discharge status: Home / Self Care | Source: Ambulatory Visit

## 2023-09-23 VITALS — Ht 66.0 in | Wt 174.6 lb

## 2023-09-23 DIAGNOSIS — J449 Chronic obstructive pulmonary disease, unspecified: Secondary | ICD-10-CM | POA: Diagnosis not present

## 2023-09-23 NOTE — Progress Notes (Signed)
 Daily Session Note  Patient Details  Name: Chad Hensley MRN: [id number] Date of Birth: Apr 28, 1951 Referring Provider:   Flowsheet Row PULMONARY REHAB COPD ORIENTATION from 05/03/2023 in [facility] CARDIAC REHABILITATION  Referring Provider [facility]]       Encounter Date: 09/23/2023  Check In:  Session Check In - 09/23/23 1314       Check-In   Staff Present Doug Gehrig, RN, BSN;Jessica [place], MA, RCEP, CCRP, CCET;Heather Alec Huntington, Exercise Physiologist;Hillary Lennie Ra BSN, RN    Virtual Visit No    Medication changes reported     No    Fall or balance concerns reported    No    Warm-up and Cool-down Performed on first and last piece of equipment    Resistance Training Performed Yes    VAD Patient? No    PAD/SET Patient? No      Pain Assessment   Currently in Pain? No/denies    Pain Score 0-No pain    Multiple Pain Sites No             Capillary Blood Glucose: No results found for this or any previous visit (from the past 24 hours).    Social History   Tobacco Use  Smoking Status Former   Current packs/day: 0.00   Average packs/day: 0.5 packs/day for 2.0 years (1.0 ttl pk-yrs)   Types: Cigarettes   Start date: 04/03/1969   Quit date: 05/04/1970   Years since quitting: 53.4  Smokeless Tobacco Not on file    Goals Met:  Proper associated with RPD/PD & O2 Sat Independence with exercise equipment Using PLB without cueing & demonstrates good technique Exercise tolerated well No report of concerns or symptoms today Strength training completed today  Goals Unmet:  Not Applicable  Comments: Pt able to follow exercise prescription today without complaint.  Will continue to monitor for progression.

## 2023-09-23 NOTE — Patient Instructions (Signed)
 Discharge Patient Instructions  Patient Details  Name: Chad Hensley MRN: [id number] Date of Birth: 1950-12-05 Referring Provider:  Clinic, [facility]   Number of Visits: 36  Reason for Discharge:  Patient reached a stable level of exercise. Patient independent in their exercise. Patient has met program and personal goals.  Diagnosis:  Chronic obstructive pulmonary disease, unspecified COPD type (HCC)  Initial Exercise Prescription:  Initial Exercise Prescription - 05/03/23 1500       Date of Initial Exercise RX and Referring Provider   Date 05/03/23    Referring Provider [facility]     Oxygen   Oxygen Continuous    Liters 3    Maintain Oxygen Saturation 88% or higher      NuStep   Level 1    SPM 80    Minutes 15    METs 2.1      REL-XR   Level 1    Speed 50    Minutes 15    METs 2.1      Track   Laps 10    Minutes 15    METs 2      Prescription Details   Frequency (times per week) 2    Duration Progress to 30 minutes of continuous aerobic without signs/symptoms of physical distress      Intensity   THRR 40-80% of Max Heartrate 91-131    Ratings of Perceived Exertion 11-13    Perceived Dyspnea 0-4      Progression   Progression Continue to progress workloads to maintain intensity without signs/symptoms of physical distress.      Resistance Training   Training Prescription Yes    Weight 3 lb    Reps 10-15             Discharge Exercise Prescription (Final Exercise Prescription Changes):  Exercise Prescription Changes - 09/21/23 1500       Response to Exercise   Blood Pressure (Admit) 112/70    Blood Pressure (Exit) 138/70    Heart Rate (Admit) 91 bpm    Heart Rate (Exercise) 99 bpm    Heart Rate (Exit) 90 bpm    Oxygen Saturation (Admit) 89 %    Oxygen Saturation (Exercise) 93 %    Oxygen Saturation (Exit) 95 %    Rating of Perceived Exertion (Exercise) 12    Perceived Dyspnea (Exercise) 2    Duration  Continue with 30 min of aerobic exercise without signs/symptoms of physical distress.    Intensity THRR unchanged      Progression   Progression Continue to progress workloads to maintain intensity without signs/symptoms of physical distress.      Resistance Training   Training Prescription Yes    Weight 5    Reps 10-15      Oxygen   Oxygen Continuous    Liters 3      NuStep   Level 3    SPM 72    Minutes 15    METs 2.1      Arm Ergometer   Level 1    Watts 45    Minutes 15    METs 1.8      Oxygen   Maintain Oxygen Saturation 88% or higher             Functional Capacity:  6 Minute Walk     Row Name 05/03/23 1531 09/23/23 1348       6 Minute Walk   Phase Initial Discharge  Distance 940 feet 1055 feet    Distance Feet Change -- 115 ft    Walk Time 6 minutes 6 minutes    # of Rest Breaks 0 0    MPH 1.78 2    METS 2.15 2.4    RPE 14 13    Perceived Dyspnea  2 3    VO2 Peak 7.54 8.4    Symptoms Yes (comment) No    Comments SOB, chest tightness 3/10, carrying small e-cylinder --    Resting HR 68 bpm 83 bpm    Resting BP 118/66 160/84    Resting Oxygen Saturation  93 % 93 %    Exercise Oxygen Saturation  during 6 min walk 86 % 90 %    Max Ex. HR 106 bpm 94 bpm    Max Ex. BP 148/84 160/86    2 Minute Post BP 122/64 158/86      Interval HR   1 Minute HR 92 88    2 Minute HR 106 89    3 Minute HR 100 88    4 Minute HR 98 88    5 Minute HR 102 92    6 Minute HR 99 94    2 Minute Post HR 81 86    Interval Heart Rate? Yes Yes      Interval Oxygen   Interval Oxygen? Yes Yes    Baseline Oxygen Saturation % 93 %  1L NCC 93 %    1 Minute Oxygen Saturation % 90 % 92 %    1 Minute Liters of Oxygen 3 L  NCP 3 L    2 Minute Oxygen Saturation % 87 % 92 %    2 Minute Liters of Oxygen 3 L 3 L    3 Minute Oxygen Saturation % 86 % 93 %    3 Minute Liters of Oxygen 3 L 3 L    4 Minute Oxygen Saturation % 86 % 90 %    4 Minute Liters of Oxygen 3 L 3 L    5  Minute Oxygen Saturation % 87 % 90 %    5 Minute Liters of Oxygen 3 L 3 L    6 Minute Oxygen Saturation % 86 % 90 %    6 Minute Liters of Oxygen 3 L 3 L    2 Minute Post Oxygen Saturation % 94 % 97 %    2 Minute Post Liters of Oxygen 3 L 3 L            Nutrition & Weight - Outcomes:  Pre Biometrics - 05/03/23 1534       Pre Biometrics   Height 5\' 6"  (1.676 m)    Weight 83.7 kg    Waist Circumference 40.5 inches    Hip Circumference 40 inches    Waist to Hip Ratio 1.01 %    BMI (Calculated) 29.79    Grip Strength 32.1 kg    Single Leg Stand 4.4 seconds             Post Biometrics - 09/23/23 1353        Post  Biometrics   Height 5\' 6"  (1.676 m)    Weight 79.2 kg    Waist Circumference 42 inches    Hip Circumference 40 inches    Waist to Hip Ratio 1.05 %    BMI (Calculated) 28.2    Grip Strength 24.7 kg            Goals reviewed  with patient; copy given to patient.

## 2023-09-28 ENCOUNTER — Encounter (HOSPITAL_COMMUNITY)
Admission: RE | Admit: 2023-09-28 | Discharge: 2023-09-28 | Disposition: A | Discharge status: Home / Self Care | Source: Ambulatory Visit

## 2023-09-28 DIAGNOSIS — J449 Chronic obstructive pulmonary disease, unspecified: Secondary | ICD-10-CM

## 2023-09-28 NOTE — Progress Notes (Signed)
 Daily Session Note  Patient Details  Name: Chad Hensley MRN: [id number] Date of Birth: 1950/08/20 Referring Provider:   Flowsheet Row PULMONARY REHAB COPD ORIENTATION from 05/03/2023 in [facility] CARDIAC REHABILITATION  Referring Provider [facility]       Encounter Date: 09/28/2023  Check In:  Session Check In - 09/28/23 1347       Check-In   Supervising physician immediately available to respond to emergencies See telemetry face sheet for immediately available MD    Staff Present Jefferey Minerva, RN;Brittany Annette Barters, BSN, RN, WTA-C;Heather Toy Freund, BS, Exercise Physiologist    Virtual Visit No    Medication changes reported     No    Fall or balance concerns reported    No    Resistance Training Performed Yes    VAD Patient? No    PAD/SET Patient? No      Pain Assessment   Currently in Pain? No/denies             Capillary Blood Glucose: No results found for this or any previous visit (from the past 24 hours).    Social History   Tobacco Use  Smoking Status Former   Current packs/day: 0.00   Average packs/day: 0.5 packs/day for 2.0 years (1.0 ttl pk-yrs)   Types: Cigarettes   Start date: 04/03/1969   Quit date: 05/04/1970   Years since quitting: 53.4  Smokeless Tobacco Not on file    Goals Met:  Independence with exercise equipment Exercise tolerated well No report of concerns or symptoms today  Goals Unmet:  Not Applicable  Comments: Pt able to follow exercise prescription today without complaint.  Will continue to monitor for progression.

## 2023-09-30 ENCOUNTER — Encounter (HOSPITAL_COMMUNITY)
Admission: RE | Admit: 2023-09-30 | Discharge: 2023-09-30 | Disposition: A | Discharge status: Home / Self Care | Source: Ambulatory Visit

## 2023-09-30 DIAGNOSIS — J449 Chronic obstructive pulmonary disease, unspecified: Secondary | ICD-10-CM | POA: Diagnosis not present

## 2023-09-30 NOTE — Progress Notes (Signed)
 Daily Session Note  Patient Details  Name: Chad Hensley MRN: [id number] Date of Birth: July 27, 1950 Referring Provider:   Flowsheet Row PULMONARY REHAB COPD ORIENTATION from 05/03/2023 in [facility] CARDIAC REHABILITATION  Referring Provider [facility]       Encounter Date: 09/30/2023  Check In:  Session Check In - 09/30/23 1336       Check-In   Supervising physician immediately available to respond to emergencies See telemetry face sheet for immediately available MD    Location [facility]    Staff Present Clotilda Danish, BS, Exercise Physiologist;Brittany Annette Barters, BSN, RN, Aggie Horton, MA, RCEP, CCRP, CCET    Virtual Visit No    Medication changes reported     No    Fall or balance concerns reported    No    Warm-up and Cool-down Performed on first and last piece of equipment    Resistance Training Performed Yes    VAD Patient? No    PAD/SET Patient? No      Pain Assessment   Currently in Pain? No/denies             Capillary Blood Glucose: No results found for this or any previous visit (from the past 24 hours).    Social History   Tobacco Use  Smoking Status Former   Current packs/day: 0.00   Average packs/day: 0.5 packs/day for 2.0 years (1.0 ttl pk-yrs)   Types: Cigarettes   Start date: 04/03/1969   Quit date: 05/04/1970   Years since quitting: 53.4  Smokeless Tobacco Not on file    Goals Met:  Proper associated with RPD/PD & O2 Sat Independence with exercise equipment Using PLB without cueing & demonstrates good technique Exercise tolerated well No report of concerns or symptoms today Strength training completed today  Goals Unmet:  Not Applicable  Comments: Pt able to follow exercise prescription today without complaint.  Will continue to monitor for progression.

## 2023-10-05 ENCOUNTER — Encounter (HOSPITAL_COMMUNITY)
Admission: RE | Admit: 2023-10-05 | Discharge: 2023-10-05 | Disposition: A | Discharge status: Home / Self Care | Source: Ambulatory Visit

## 2023-10-05 DIAGNOSIS — J449 Chronic obstructive pulmonary disease, unspecified: Secondary | ICD-10-CM | POA: Diagnosis present

## 2023-10-05 NOTE — Progress Notes (Signed)
 Daily Session Note  Patient Details  Name: Chad Hensley MRN: [id number] Date of Birth: Jan 08, 1951 Referring Provider:   Flowsheet Row PULMONARY REHAB COPD ORIENTATION from 05/03/2023 in [facility] CARDIAC REHABILITATION  Referring Provider [facility]       Encounter Date: 10/05/2023  Check In:  Session Check In - 10/05/23 1330       Check-In   Supervising physician immediately available to respond to emergencies See telemetry face sheet for immediately available MD    Location [facility]    Staff Present Clotilda Danish, BS, Exercise Physiologist;Phyllis Billingsley, RN;Brittany Annette Barters, BSN, RN, WTA-C    Virtual Visit No    Medication changes reported     No    Fall or balance concerns reported    No    Tobacco Cessation No Change    Warm-up and Cool-down Performed on first and last piece of equipment    Resistance Training Performed Yes    VAD Patient? No    PAD/SET Patient? No      Pain Assessment   Currently in Pain? No/denies    Pain Score 0-No pain    Multiple Pain Sites No             Capillary Blood Glucose: No results found for this or any previous visit (from the past 24 hours).    Social History   Tobacco Use  Smoking Status Former   Current packs/day: 0.00   Average packs/day: 0.5 packs/day for 2.0 years (1.0 ttl pk-yrs)   Types: Cigarettes   Start date: 04/03/1969   Quit date: 05/04/1970   Years since quitting: 53.4  Smokeless Tobacco Not on file    Goals Met:  Independence with exercise equipment Exercise tolerated well No report of concerns or symptoms today Strength training completed today  Goals Unmet:  Not Applicable  Comments: Pt able to follow exercise prescription today without complaint.  Will continue to monitor for progression.

## 2023-10-07 ENCOUNTER — Encounter (HOSPITAL_COMMUNITY)
Admission: RE | Admit: 2023-10-07 | Discharge: 2023-10-07 | Disposition: A | Discharge status: Home / Self Care | Source: Ambulatory Visit

## 2023-10-07 DIAGNOSIS — J449 Chronic obstructive pulmonary disease, unspecified: Secondary | ICD-10-CM | POA: Diagnosis not present

## 2023-10-07 NOTE — Progress Notes (Signed)
 Daily Session Note  Patient Details  Name: Chad Hensley MRN: [id number] Date of Birth: 10-31-1950 Referring Provider:   Flowsheet Row PULMONARY REHAB COPD ORIENTATION from 05/03/2023 in [facility] CARDIAC REHABILITATION  Referring Provider [facility]       Encounter Date: 10/07/2023  Check In:  Session Check In - 10/07/23 1315       Check-In   Supervising physician immediately available to respond to emergencies See telemetry face sheet for immediately available MD    Location [facility]    Staff Present Ronna Coho BSN, RN;Heather [place], BS, Exercise Physiologist;Debra Lincoln Renshaw, RN, BSN    Virtual Visit No    Medication changes reported     Yes    Comments Pt stated that a blood pressure medication had changed but he didn't know the name-pt stated he would write down the name of the medicine and bring it at his next session    Fall or balance concerns reported    No    Tobacco Cessation No Change    Warm-up and Cool-down Performed on first and last piece of equipment    Resistance Training Performed Yes    VAD Patient? No    PAD/SET Patient? No      Pain Assessment   Currently in Pain? No/denies    Pain Score 0-No pain    Multiple Pain Sites No             Capillary Blood Glucose: No results found for this or any previous visit (from the past 24 hours).    Social History   Tobacco Use  Smoking Status Former   Current packs/day: 0.00   Average packs/day: 0.5 packs/day for 2.0 years (1.0 ttl pk-yrs)   Types: Cigarettes   Start date: 04/03/1969   Quit date: 05/04/1970   Years since quitting: 53.4  Smokeless Tobacco Not on file    Goals Met:  Proper associated with RPD/PD & O2 Sat Independence with exercise equipment Using PLB without cueing & demonstrates good technique Exercise tolerated well Queuing for purse lip breathing No report of concerns or symptoms today Strength training completed today  Goals Unmet:   Not Applicable  Comments: Chad AasAaron AasPt able to follow exercise prescription today without complaint.  Will continue to monitor for progression.

## 2023-10-12 ENCOUNTER — Encounter (HOSPITAL_COMMUNITY)
Admission: RE | Admit: 2023-10-12 | Discharge: 2023-10-12 | Disposition: A | Discharge status: Home / Self Care | Source: Ambulatory Visit

## 2023-10-12 DIAGNOSIS — J449 Chronic obstructive pulmonary disease, unspecified: Secondary | ICD-10-CM

## 2023-10-12 NOTE — Progress Notes (Signed)
 Daily Session Note  Patient Details  Name: Chad Hensley MRN: [id number] Date of Birth: Jan 19, 1951 Referring Provider:   Flowsheet Row PULMONARY REHAB COPD ORIENTATION from 05/03/2023 in [facility] CARDIAC REHABILITATION  Referring Provider [facility]       Encounter Date: 10/12/2023  Check In:  Session Check In - 10/12/23 1330       Check-In   Supervising physician immediately available to respond to emergencies See telemetry face sheet for immediately available MD    Location [facility]    Staff Present Clotilda Danish, BS, Exercise Physiologist;Phyllis Billingsley, RN;Brittany Annette Barters, BSN, RN, WTA-C    Virtual Visit No    Fall or balance concerns reported    No    Tobacco Cessation No Change    Warm-up and Cool-down Performed on first and last piece of equipment    Resistance Training Performed Yes    VAD Patient? No    PAD/SET Patient? No      Pain Assessment   Currently in Pain? No/denies    Pain Score 0-No pain    Multiple Pain Sites No             Capillary Blood Glucose: No results found for this or any previous visit (from the past 24 hours).    Social History   Tobacco Use  Smoking Status Former   Current packs/day: 0.00   Average packs/day: 0.5 packs/day for 2.0 years (1.0 ttl pk-yrs)   Types: Cigarettes   Start date: 04/03/1969   Quit date: 05/04/1970   Years since quitting: 53.4  Smokeless Tobacco Not on file    Goals Met:  Independence with exercise equipment Exercise tolerated well No report of concerns or symptoms today Strength training completed today  Goals Unmet:  Not Applicable  Comments: Pt able to follow exercise prescription today without complaint.  Will continue to monitor for progression.

## 2023-10-14 ENCOUNTER — Encounter (HOSPITAL_COMMUNITY)
Admission: RE | Admit: 2023-10-14 | Discharge: 2023-10-14 | Disposition: A | Discharge status: Home / Self Care | Source: Ambulatory Visit

## 2023-10-14 DIAGNOSIS — J449 Chronic obstructive pulmonary disease, unspecified: Secondary | ICD-10-CM

## 2023-10-14 NOTE — Progress Notes (Signed)
 Daily Session Note  Patient Details  Name: Chad Hensley MRN: [id number] Date of Birth: Nov 11, 1950 Referring Provider:   Flowsheet Row PULMONARY REHAB COPD ORIENTATION from 05/03/2023 in [facility] CARDIAC REHABILITATION  Referring Provider [facility]    Encounter Date: 10/14/2023  Check In:  Session Check In - 10/14/23 1341       Check-In   Supervising physician immediately available to respond to emergencies See telemetry face sheet for immediately available MD    Location [facility]    Staff Present Ronna Coho BSN, RN;Heather Toy Freund, BS, Exercise Physiologist;Jessica Melucci [place], [place], RCEP, CCRP, CCET    Virtual Visit No    Medication changes reported     No    Fall or balance concerns reported    No    Tobacco Cessation No Change    Warm-up and Cool-down Performed on first and last piece of equipment    Resistance Training Performed Yes    VAD Patient? No    PAD/SET Patient? No      Pain Assessment   Currently in Pain? No/denies    Pain Score 0-No pain    Multiple Pain Sites No          Capillary Blood Glucose: No results found for this or any previous visit (from the past 24 hours).    Social History   Tobacco Use  Smoking Status Former   Current packs/day: 0.00   Average packs/day: 0.5 packs/day for 2.0 years (1.0 ttl pk-yrs)   Types: Cigarettes   Start date: 04/03/1969   Quit date: 05/04/1970   Years since quitting: 53.4  Smokeless Tobacco Not on file    Goals Met:  Proper associated with RPD/PD & O2 Sat Independence with exercise equipment Using PLB without cueing & demonstrates good technique Exercise tolerated well Queuing for purse lip breathing No report of concerns or symptoms today Strength training completed today  Goals Unmet:  Not Applicable  Comments: .Chad Hensley Chad Hensley graduated today from  rehab with 36 sessions completed.  Details of the patient's exercise prescription and what He needs to do in order to  continue the prescription and progress were discussed with patient.  Patient was given a copy of prescription and goals.  Patient verbalized understanding. Chad Hensley plans to continue to exercise by exercising at home and joining a gym.

## 2023-10-14 NOTE — Progress Notes (Signed)
 Discharge Progress Report  Patient Details  Name: Chad Hensley MRN: [id number] Date of Birth: [birth year] Referring Provider:   Flowsheet Row PULMONARY REHAB COPD ORIENTATION from 05/03/2023 in [facility] CARD[facility]AC REHABILITATION  Referring Provider [facility]     Number of Visits: 36  Reason for Discharge:  Patient reached a stable level of exercise. Patient independent in their exercise. Patient has met program and personal goals.  Smoking History:  Social History   Tobacco Use  Smoking Status Former   Current packs/day: 0.00   Average packs/day: 0.5 packs/day for 2.0 years (1.0 ttl pk-yrs)   Types: Cigarettes   Start date: 04/03/1969   Quit date: 05/04/1970   Years since quitting: 53.4  Smokeless Tobacco Not on file    Diagnosis:  Chronic obstructive pulmonary disease, unspecified COPD type (HCC)  ADL UCSD:  Pulmonary Assessment Scores     Row Name 05/03/23 1538 10/12/23 1306       ADL UCSD   ADL Phase Entry Exit    SOB Score total 100 39    Rest 2 1    Walk 3 2    Stairs 5 3    Bath 4 2    Dress 3 1    Shop 3 3      CAT Score   CAT Score 21 23      mMRC Score   mMRC Score 3 --       Initial Exercise Prescription:  Initial Exercise Prescription - 05/03/23 1500       Date of Initial Exercise RX and Referring Provider   Date 05/03/23    Referring Provider [facility]     Oxygen   Oxygen Continuous    Liters 3    Maintain Oxygen Saturation 88% or higher      NuStep   Level 1    SPM 80    Minutes 15    METs 2.1      REL-XR   Level 1    Speed 50    Minutes 15    METs 2.1      Track   Laps 10    Minutes 15    METs 2      Prescription Details   Frequency (times per week) 2    Duration Progress to 30 minutes of continuous aerobic without signs/symptoms of physical distress      Intensity   THRR 40-80% of Max Heartrate 91-131    Ratings of Perceived Exertion 11-13    Perceived Dyspnea 0-4       Progression   Progression Continue to progress workloads to maintain intensity without signs/symptoms of physical distress.      Resistance Training   Training Prescription Yes    Weight 3 lb    Reps 10-15          Discharge Exercise Prescription (Final Exercise Prescription Changes):  Exercise Prescription Changes - 10/05/23 1500       Response to Exercise   Blood Pressure (Admit) 140/80    Blood Pressure (Exit) 140/72    Heart Rate (Admit) 87 bpm    Heart Rate (Exercise) 92 bpm    Heart Rate (Exit) 84 bpm    Oxygen Saturation (Admit) 93 %    Oxygen Saturation (Exercise) 95 %    Oxygen Saturation (Exit) 97 %    Rating of Perceived Exertion (Exercise) 13    Perceived Dyspnea (Exercise) 2    Duration Continue  with 30 min of aerobic exercise without signs/symptoms of physical distress.    Intensity THRR unchanged      Progression   Progression Continue to progress workloads to maintain intensity without signs/symptoms of physical distress.      Resistance Training   Training Prescription Yes    Weight 5    Reps 10-15      Oxygen   Oxygen Continuous    Liters 3      NuStep   Level 3    SPM 92    Minutes 15    METs 2      REL-XR   Level 2    Speed 52    Minutes 15    METs 2.1      Oxygen   Maintain Oxygen Saturation 88% or higher          Functional Capacity:  6 Minute Walk     Row Name 05/03/23 1531 09/23/23 1348       6 Minute Walk   Phase Initial Discharge    Distance 940 feet 1055 feet    Distance Feet Change -- 115 ft    Walk Time 6 minutes 6 minutes    # of Rest Breaks 0 0    MPH 1.78 2    METS 2.15 2.4    RPE 14 13    Perceived Dyspnea  2 3    VO2 Peak 7.54 8.4    Symptoms Yes (comment) No    Comments SOB, chest tightness 3/10, carrying small e-cylinder --    Resting HR 68 bpm 83 bpm    Resting BP 118/66 160/84    Resting Oxygen Saturation  93 % 93 %    Exercise Oxygen Saturation  during 6 min walk 86 % 90 %    Max Ex. HR 106  bpm 94 bpm    Max Ex. BP 148/84 160/86    2 Minute Post BP 122/64 158/86      Interval HR   1 Minute HR 92 88    2 Minute HR 106 89    3 Minute HR 100 88    4 Minute HR 98 88    5 Minute HR 102 92    6 Minute HR 99 94    2 Minute Post HR 81 86    Interval Heart Rate? Yes Yes      Interval Oxygen   Interval Oxygen? Yes Yes    Baseline Oxygen Saturation % 93 %  1L NCC 93 %    1 Minute Oxygen Saturation % 90 % 92 %    1 Minute Liters of Oxygen 3 L  NCP 3 L    2 Minute Oxygen Saturation % 87 % 92 %    2 Minute Liters of Oxygen 3 L 3 L    3 Minute Oxygen Saturation % 86 % 93 %    3 Minute Liters of Oxygen 3 L 3 L    4 Minute Oxygen Saturation % 86 % 90 %    4 Minute Liters of Oxygen 3 L 3 L    5 Minute Oxygen Saturation % 87 % 90 %    5 Minute Liters of Oxygen 3 L 3 L    6 Minute Oxygen Saturation % 86 % 90 %    6 Minute Liters of Oxygen 3 L 3 L    2 Minute Post Oxygen Saturation % 94 % 97 %    2 Minute Post Liters of  Oxygen 3 L 3 L       Psychological, QOL, Others - Outcomes: PHQ 2/9:    10/12/2023    1:11 PM 08/03/2023    1:53 PM 07/06/2023    2:12 PM 05/18/2023    1:36 PM 05/03/2023    3:39 PM  Depression screen PHQ 2/9  Decreased Interest 2 0 1 1 2   Down, Depressed, Hopeless 3 0 1 0 2  PHQ - 2 Score 5 0 2 1 4   Altered sleeping 2 1 0 0 2  Tired, decreased energy 2 1 1 3 3   Change in appetite 0 0 1 0 2  Feeling bad or failure about yourself  0 0 0 0 2  Trouble concentrating 2 1 1 2 3   Moving slowly or fidgety/restless 2 1 1 2 2   Suicidal thoughts 0 0 0 0 0  PHQ-9 Score 13 4 6 8 18   Difficult doing work/chores Somewhat difficult Somewhat difficult Somewhat difficult Somewhat difficult Very difficult    Quality of Life:  Nutrition & Weight - Outcomes:  Pre Biometrics - 05/03/23 1534       Pre Biometrics   Height 5' 6 (1.676 m)    Weight 83.7 kg    Waist Circumference 40.5 inches    Hip Circumference 40 inches    Waist to Hip Ratio 1.01 %    BMI (Calculated)  29.79    Grip Strength 32.1 kg    Single Leg Stand 4.4 seconds          Post Biometrics - 09/23/23 1353        Post  Biometrics   Height 5' 6 (1.676 m)    Weight 79.2 kg    Waist Circumference 42 inches    Hip Circumference 40 inches    Waist to Hip Ratio 1.05 %    BMI (Calculated) 28.2    Grip Strength 24.7 kg          Goals reviewed with patient; copy given to patient.

## 2023-10-14 NOTE — Progress Notes (Signed)
 Pulmonary Individual Treatment Plan  Patient Details  Name: Chad Hensley MRN: [id number] Date of Birth: Mar 06, 1951 Referring Provider:   Flowsheet Row PULMONARY REHAB COPD ORIENTATION from 05/03/2023 in [facility] CARDIAC REHABILITATION  Referring Provider Briones, Vil[facility]]    Initial Encounter Date:  Flowsheet Row PULMONARY REHAB COPD ORIENTATION from 05/03/2023 in [place] CARDIAC REHABILITATION  Date 05/03/23    Visit Diagnosis: Chronic obstructive pulmonary disease, unspecified COPD type (HCC)  Patient's Home Medications on Admission:   Current Outpatient Medications:    acetaminophen  (TYLENOL ) 500 MG tablet, Take 2 tablets (1,000 mg total) by mouth every 8 (eight) hours as needed for headache., Disp: , Rfl:    ALBUTEROL  IN, Inhale into the lungs as needed., Disp: , Rfl:    amLODipine (NORVASC) 5 MG tablet, Take 5 mg by mouth daily. If BP over 130/85, Disp: , Rfl:    ascorbic acid (VITAMIN C) 500 MG tablet, Take 500 mg by mouth daily., Disp: , Rfl:    aspirin  EC 81 MG tablet, Take 1 tablet (81 mg total) by mouth daily. Swallow whole., Disp: 30 tablet, Rfl: 12   atorvastatin  (LIPITOR) 20 MG tablet, Take 20 mg by mouth daily., Disp: , Rfl:    buPROPion  (WELLBUTRIN  SR) 150 MG 12 hr tablet, Take 150 mg by mouth 2 (two) times daily., Disp: , Rfl:    carvedilol  (COREG ) 12.5 MG tablet, Take 1 tablet (12.5 mg total) by mouth 2 (two) times daily with a meal., Disp: 60 tablet, Rfl: 2   cetirizine (ZYRTEC) 10 MG tablet, Take 10 mg by mouth daily., Disp: , Rfl:    Cholecalciferol 2000 UNITS TABS, Take by mouth daily., Disp: , Rfl:    doxycycline  (VIBRA -TABS) 100 MG tablet, Take 1 tablet (100 mg total) by mouth every 12 (twelve) hours., Disp: 8 tablet, Rfl: 0   famotidine  (PEPCID ) 10 MG tablet, Take 2 tablets (20 mg total) by mouth at bedtime., Disp: , Rfl:    fluticasone-salmeterol (WIXELA INHUB) 250-50 MCG/ACT AEPB, Inhale 1 puff into the lungs in the morning and at bedtime.,  Disp: , Rfl:    furosemide  (LASIX ) 20 MG tablet, Take 1 tablet (20 mg total) by mouth daily., Disp: 30 tablet, Rfl: 3   guaiFENesin  (MUCINEX ) 600 MG 12 hr tablet, Take 1 tablet (600 mg total) by mouth 2 (two) times daily., Disp: 10 tablet, Rfl: 0   hydrOXYzine (ATARAX) 10 MG tablet, Take 10 mg by mouth 3 (three) times daily as needed., Disp: , Rfl:    isosorbide  mononitrate (IMDUR ) 30 MG 24 hr tablet, Take 1 tablet (30 mg total) by mouth daily., Disp: 30 tablet, Rfl: 2   losartan  (COZAAR ) 50 MG tablet, Take 1 tablet (50 mg total) by mouth daily., Disp: 30 tablet, Rfl: 2   Omega-3 Fatty Acids (FISH OIL PO), Take 1 capsule by mouth 2 (two) times daily., Disp: , Rfl:    omeprazole (PRILOSEC) 20 MG capsule, Take 20 mg by mouth daily., Disp: , Rfl:    prazosin (MINIPRESS) 2 MG capsule, Take 2 mg by mouth at bedtime., Disp: , Rfl:    predniSONE  (DELTASONE ) 10 MG tablet, Prednisone  40 mg po daily x 1 day then Prednisone  30 mg po daily x 1 day then Prednisone  20 mg po daily x 1 day then Prednisone  10 mg daily x 1 day then stop..., Disp: 10 tablet, Rfl: 0   Tiotropium Bromide-Olodaterol (STIOLTO RESPIMAT) 2.5-2.5 MCG/ACT AERS, Inhale into the lungs daily., Disp: , Rfl:  vitamin B-12 (CYANOCOBALAMIN) 50 MCG tablet, Take 50 mcg by mouth daily., Disp: , Rfl:   Past Medical History: Past Medical History:  Diagnosis Date   COPD (chronic obstructive pulmonary disease) (HCC)    Hypertension    PTSD (post-traumatic stress disorder) 05/04/1976    Tobacco Use: Social History   Tobacco Use  Smoking Status Former   Current packs/day: 0.00   Average packs/day: 0.5 packs/day for 2.0 years (1.0 ttl pk-yrs)   Types: Cigarettes   Start date: 04/03/1969   Quit date: 05/04/1970   Years since quitting: 53.4  Smokeless Tobacco Not on file    Labs: Review Flowsheet       Latest Ref Rng & Units 05/01/2008 02/15/2022 02/16/2022  Labs for ITP Cardiac and Pulmonary Rehab  Cholestrol 0 - 200 mg/dL - - 161   LDL  (calc) 0 - 99 mg/dL - - 73   HDL-C >09 mg/dL - - 33   Trlycerides <604 mg/dL - - 540   Hemoglobin J8J 4.8 - 5.6 % - 5.4  -  TCO2 0 - 100 mmol/L 25  - -    Capillary Blood Glucose: No results found for: GLUCAP   Pulmonary Assessment Scores:  Pulmonary Assessment Scores     Row Name 05/03/23 1538 10/12/23 1306       ADL UCSD   ADL Phase Entry Exit    SOB Score total 100 39    Rest 2 1    Walk 3 2    Stairs 5 3    Bath 4 2    Dress 3 1    Shop 3 3      CAT Score   CAT Score 21 23      mMRC Score   mMRC Score 3 --      UCSD: Self-administered rating of dyspnea associated with activities of daily living (ADLs) 6-point scale (0 = not at all to 5 = maximal or unable to do because of breathlessness)  Scoring Scores range from 0 to 120.  Minimally important difference is 5 units  CAT: CAT can identify the health impairment of COPD patients and is better correlated with disease progression.  CAT has a scoring range of zero to 40. The CAT score is classified into four groups of low (less than 10), medium (10 - 20), high (21-30) and very high (31-40) based on the impact level of disease on health status. A CAT score over 10 suggests significant symptoms.  A worsening CAT score could be explained by an exacerbation, poor medication adherence, poor inhaler technique, or progression of COPD or comorbid conditions.  CAT MCID is 2 points  mMRC: mMRC (Modified Medical Research Council) Dyspnea Scale is used to assess the degree of baseline functional disability in patients of respiratory disease due to dyspnea. No minimal important difference is established. A decrease in score of 1 point or greater is considered a positive change.   Pulmonary Function Assessment:  Pulmonary Function Assessment - 05/03/23 1537       Initial Spirometry Results   FVC% 82.5 %    FEV1% 39.4 %    FEV1/FVC Ratio 36    Comments 12/15/2022      Post Bronchodilator Spirometry Results   FVC% 102.5  %    FEV1% 40.2 %    FEV1/FVC Ratio 30    Comments 12/15/22      Breath   Shortness of Breath Yes;Fear of Shortness of Breath;Limiting activity;Panic with Shortness of Breath  Exercise Target Goals: Exercise Program Goal: Individual exercise prescription set using results from initial 6 min walk test and THRR while considering  patient's activity barriers and safety.   Exercise Prescription Goal: Initial exercise prescription builds to 30-45 minutes a day of aerobic activity, 2-3 days per week.  Home exercise guidelines will be given to patient during program as part of exercise prescription that the participant will acknowledge.  Activity Barriers & Risk Stratification:  Activity Barriers & Cardiac Risk Stratification - 04/26/23 1323       Activity Barriers & Cardiac Risk Stratification   Activity Barriers Deconditioning;Shortness of Breath;Joint Problems   Left shoulder joint replaced 3 years ago.   Cardiac Risk Stratification Low          6 Minute Walk:  6 Minute Walk     Row Name 05/03/23 1531 09/23/23 1348       6 Minute Walk   Phase Initial Discharge    Distance 940 feet 1055 feet    Distance Feet Change -- 115 ft    Walk Time 6 minutes 6 minutes    # of Rest Breaks 0 0    MPH 1.78 2    METS 2.15 2.4    RPE 14 13    Perceived Dyspnea  2 3    VO2 Peak 7.54 8.4    Symptoms Yes (comment) No    Comments SOB, chest tightness 3/10, carrying small e-cylinder --    Resting HR 68 bpm 83 bpm    Resting BP 118/66 160/84    Resting Oxygen Saturation  93 % 93 %    Exercise Oxygen Saturation  during 6 min walk 86 % 90 %    Max Ex. HR 106 bpm 94 bpm    Max Ex. BP 148/84 160/86    2 Minute Post BP 122/64 158/86      Interval HR   1 Minute HR 92 88    2 Minute HR 106 89    3 Minute HR 100 88    4 Minute HR 98 88    5 Minute HR 102 92    6 Minute HR 99 94    2 Minute Post HR 81 86    Interval Heart Rate? Yes Yes      Interval Oxygen   Interval Oxygen?  Yes Yes    Baseline Oxygen Saturation % 93 %  1L NCC 93 %    1 Minute Oxygen Saturation % 90 % 92 %    1 Minute Liters of Oxygen 3 L  NCP 3 L    2 Minute Oxygen Saturation % 87 % 92 %    2 Minute Liters of Oxygen 3 L 3 L    3 Minute Oxygen Saturation % 86 % 93 %    3 Minute Liters of Oxygen 3 L 3 L    4 Minute Oxygen Saturation % 86 % 90 %    4 Minute Liters of Oxygen 3 L 3 L    5 Minute Oxygen Saturation % 87 % 90 %    5 Minute Liters of Oxygen 3 L 3 L    6 Minute Oxygen Saturation % 86 % 90 %    6 Minute Liters of Oxygen 3 L 3 L    2 Minute Post Oxygen Saturation % 94 % 97 %    2 Minute Post Liters of Oxygen 3 L 3 L       Oxygen Initial Assessment:  Oxygen Initial  Assessment - 05/03/23 1536       Home Oxygen   Home Oxygen Device Home Concentrator;Portable Concentrator;E-Tanks    Sleep Oxygen Prescription Continuous    Liters per minute 2    Home Exercise Oxygen Prescription Continuous    Liters per minute 3    Home Resting Oxygen Prescription Continuous    Liters per minute 2    Compliance with Home Oxygen Use Yes      Initial 6 min Walk   Oxygen Used Pulsed;E-Tanks    Liters per minute 3      Program Oxygen Prescription   Program Oxygen Prescription Continuous;E-Tanks    Liters per minute 3      Intervention   Short Term Goals To learn and exhibit compliance with exercise, home and travel O2 prescription;To learn and understand importance of monitoring SPO2 with pulse oximeter and demonstrate accurate use of the pulse oximeter.;To learn and understand importance of maintaining oxygen saturations>88%;To learn and demonstrate proper pursed lip breathing techniques or other breathing techniques. ;To learn and demonstrate proper use of respiratory medications    Long  Term Goals Exhibits compliance with exercise, home  and travel O2 prescription;Verbalizes importance of monitoring SPO2 with pulse oximeter and return demonstration;Maintenance of O2 saturations>88%;Exhibits  proper breathing techniques, such as pursed lip breathing or other method taught during program session;Compliance with respiratory medication;Demonstrates proper use of MDI's          Oxygen Re-Evaluation:  Oxygen Re-Evaluation     Row Name 05/11/23 1516 05/18/23 1345 06/29/23 1507 07/27/23 1422 08/03/23 1408     Program Oxygen Prescription   Program Oxygen Prescription -- Continuous;E-Tanks Continuous;E-Tanks Continuous;E-Tanks Continuous;E-Tanks   Liters per minute -- -- 3 3 3    Comments -- -- -- Patient is being compliant with his home oxygen and c-pap. --     Home Oxygen   Home Oxygen Device -- Home Concentrator;Portable Concentrator;E-Tanks Home Concentrator;Portable Concentrator;E-Tanks Home Concentrator;Portable Concentrator;E-Tanks Home Concentrator;Portable Concentrator;E-Tanks   Sleep Oxygen Prescription -- Continuous Continuous Continuous Continuous   Liters per minute -- 2 2 2 2    Home Exercise Oxygen Prescription -- Continuous Continuous Continuous Continuous   Liters per minute -- 2.5 2.5 2 2    Home Resting Oxygen Prescription -- Continuous -- Continuous Continuous   Liters per minute -- 2 2 2 2    Compliance with Home Oxygen Use -- Yes Yes Yes Yes     Goals/Expected Outcomes   Short Term Goals To learn and understand importance of maintaining oxygen saturations>88%;To learn and understand importance of monitoring SPO2 with pulse oximeter and demonstrate accurate use of the pulse oximeter.;To learn and demonstrate proper pursed lip breathing techniques or other breathing techniques.  To learn and understand importance of maintaining oxygen saturations>88%;To learn and understand importance of monitoring SPO2 with pulse oximeter and demonstrate accurate use of the pulse oximeter.;To learn and demonstrate proper pursed lip breathing techniques or other breathing techniques. ;To learn and exhibit compliance with exercise, home and travel O2 prescription;To learn and demonstrate  proper use of respiratory medications To learn and understand importance of maintaining oxygen saturations>88%;To learn and understand importance of monitoring SPO2 with pulse oximeter and demonstrate accurate use of the pulse oximeter.;To learn and demonstrate proper pursed lip breathing techniques or other breathing techniques. ;To learn and exhibit compliance with exercise, home and travel O2 prescription;To learn and demonstrate proper use of respiratory medications To learn and understand importance of maintaining oxygen saturations>88%;To learn and understand importance of monitoring SPO2 with pulse oximeter and  demonstrate accurate use of the pulse oximeter.;To learn and demonstrate proper pursed lip breathing techniques or other breathing techniques. ;To learn and exhibit compliance with exercise, home and travel O2 prescription;To learn and demonstrate proper use of respiratory medications To learn and understand importance of maintaining oxygen saturations>88%;To learn and understand importance of monitoring SPO2 with pulse oximeter and demonstrate accurate use of the pulse oximeter.;To learn and demonstrate proper pursed lip breathing techniques or other breathing techniques. ;To learn and exhibit compliance with exercise, home and travel O2 prescription;To learn and demonstrate proper use of respiratory medications   Long  Term Goals Maintenance of O2 saturations>88%;Verbalizes importance of monitoring SPO2 with pulse oximeter and return demonstration;Exhibits proper breathing techniques, such as pursed lip breathing or other method taught during program session Maintenance of O2 saturations>88%;Verbalizes importance of monitoring SPO2 with pulse oximeter and return demonstration;Exhibits proper breathing techniques, such as pursed lip breathing or other method taught during program session;Exhibits compliance with exercise, home  and travel O2 prescription;Demonstrates proper use of MDI's;Compliance  with respiratory medication Maintenance of O2 saturations>88%;Verbalizes importance of monitoring SPO2 with pulse oximeter and return demonstration;Exhibits proper breathing techniques, such as pursed lip breathing or other method taught during program session;Exhibits compliance with exercise, home  and travel O2 prescription;Demonstrates proper use of MDI's;Compliance with respiratory medication Maintenance of O2 saturations>88%;Verbalizes importance of monitoring SPO2 with pulse oximeter and return demonstration;Exhibits proper breathing techniques, such as pursed lip breathing or other method taught during program session;Exhibits compliance with exercise, home  and travel O2 prescription;Demonstrates proper use of MDI's;Compliance with respiratory medication Maintenance of O2 saturations>88%;Verbalizes importance of monitoring SPO2 with pulse oximeter and return demonstration;Exhibits proper breathing techniques, such as pursed lip breathing or other method taught during program session;Exhibits compliance with exercise, home  and travel O2 prescription;Demonstrates proper use of MDI's;Compliance with respiratory medication   Comments Reviewed PLB technique with pt.  Talked about how it works and it's importance in maintaining their exercise saturations. Chad Hensley is doing well in rehab.  He is good about wearing his oxygen when at home and out and about.  However, he does take it off to go to mailbox and gets winded quickly.  He was encourage to wear it even then to maintain saturations.  He is doing well on his medications  He is good about using his PLB. Chad Hensley is doing well in rehab. His is wearing his oxygen as precriped and not needing to turn it up during exercise. His oxygen is staying WNL when exercising. It will drop to the lower 90s when he increases his speed or level but with PLB he is able to bring it back up. Chad Hensley is doing well at home and during rehab with his oxygen. He states his breathing gets a  little difficult at home when he is doing stuff, but that it is a lot better than it used to be. Chad Hensley is doing well in rehab.  He is doing well with his oxygen and breathing. He is good about using his PLB and saturations are doing well.  He is feeling good on his medications.   Goals/Expected Outcomes Short: Become more profiecient at using PLB.   Long: Become independent at using PLB. Short: Wear O2 out to mailbox Long; Conitnue to monitor saturations closely. Short: Contineu to work on PLB when exercising  Long; Conitnue to monitor saturations closely. Short: Continue to monitor 02 sat's when exercising and use PLB techniques. Long: Monitor O2 sat's more closely at home. Short: Conitnue to watch  oxygen levels Long: Continued compliance    Row Name 09/17/23 1951             Program Oxygen Prescription   Program Oxygen Prescription Continuous;E-Tanks       Liters per minute 3         Home Oxygen   Home Oxygen Device Home Concentrator;Portable Concentrator;E-Tanks       Sleep Oxygen Prescription Continuous       Liters per minute 2       Home Exercise Oxygen Prescription Continuous       Home Resting Oxygen Prescription Continuous       Liters per minute 2       Compliance with Home Oxygen Use Yes         Goals/Expected Outcomes   Short Term Goals To learn and understand importance of maintaining oxygen saturations>88%;To learn and understand importance of monitoring SPO2 with pulse oximeter and demonstrate accurate use of the pulse oximeter.;To learn and demonstrate proper pursed lip breathing techniques or other breathing techniques. ;To learn and exhibit compliance with exercise, home and travel O2 prescription;To learn and demonstrate proper use of respiratory medications       Long  Term Goals Maintenance of O2 saturations>88%;Verbalizes importance of monitoring SPO2 with pulse oximeter and return demonstration;Exhibits proper breathing techniques, such as pursed lip breathing or other  method taught during program session;Exhibits compliance with exercise, home  and travel O2 prescription;Demonstrates proper use of MDI's;Compliance with respiratory medication       Comments Chad Hensley is compliant with his oxygen and CPAP.  He sleeps like a baby with his CPAP.  He is doing well using the PLB and monitoring his saturations.  We did talk about it being okay to use nebulizer more than once a day when needed.       Goals/Expected Outcomes Short: Use nebulizer on bad breathing days Long: Conitnue to improve breathing          Oxygen Discharge (Final Oxygen Re-Evaluation):  Oxygen Re-Evaluation - 09/17/23 1951       Program Oxygen Prescription   Program Oxygen Prescription Continuous;E-Tanks    Liters per minute 3      Home Oxygen   Home Oxygen Device Home Concentrator;Portable Concentrator;E-Tanks    Sleep Oxygen Prescription Continuous    Liters per minute 2    Home Exercise Oxygen Prescription Continuous    Home Resting Oxygen Prescription Continuous    Liters per minute 2    Compliance with Home Oxygen Use Yes      Goals/Expected Outcomes   Short Term Goals To learn and understand importance of maintaining oxygen saturations>88%;To learn and understand importance of monitoring SPO2 with pulse oximeter and demonstrate accurate use of the pulse oximeter.;To learn and demonstrate proper pursed lip breathing techniques or other breathing techniques. ;To learn and exhibit compliance with exercise, home and travel O2 prescription;To learn and demonstrate proper use of respiratory medications    Long  Term Goals Maintenance of O2 saturations>88%;Verbalizes importance of monitoring SPO2 with pulse oximeter and return demonstration;Exhibits proper breathing techniques, such as pursed lip breathing or other method taught during program session;Exhibits compliance with exercise, home  and travel O2 prescription;Demonstrates proper use of MDI's;Compliance with respiratory medication     Comments Chad Hensley is compliant with his oxygen and CPAP.  He sleeps like a baby with his CPAP.  He is doing well using the PLB and monitoring his saturations.  We did talk about it being okay to use nebulizer  more than once a day when needed.    Goals/Expected Outcomes Short: Use nebulizer on bad breathing days Long: Conitnue to improve breathing          Initial Exercise Prescription:  Initial Exercise Prescription - 05/03/23 1500       Date of Initial Exercise RX and Referring Provider   Date 05/03/23    Referring Provider [facility]     Oxygen   Oxygen Continuous    Liters 3    Maintain Oxygen Saturation 88% or higher      NuStep   Level 1    SPM 80    Minutes 15    METs 2.1      REL-XR   Level 1    Speed 50    Minutes 15    METs 2.1      Track   Laps 10    Minutes 15    METs 2      Prescription Details   Frequency (times per week) 2    Duration Progress to 30 minutes of continuous aerobic without signs/symptoms of physical distress      Intensity   THRR 40-80% of Max Heartrate 91-131    Ratings of Perceived Exertion 11-13    Perceived Dyspnea 0-4      Progression   Progression Continue to progress workloads to maintain intensity without signs/symptoms of physical distress.      Resistance Training   Training Prescription Yes    Weight 3 lb    Reps 10-15          Perform Capillary Blood Glucose checks as needed.  Exercise Prescription Changes:   Exercise Prescription Changes     Row Name 05/03/23 1500 05/27/23 1400 06/22/23 1500 07/27/23 1500 08/19/23 1500     Response to Exercise   Blood Pressure (Admit) 118/66 140/78 140/78 160/88 138/78   Blood Pressure (Exercise) 148/84 152/90 124/84 -- --   Blood Pressure (Exit) 112/66 140/70 142/78 130/80 130/84   Heart Rate (Admit) 68 bpm 77 bpm 85 bpm 93 bpm 73 bpm   Heart Rate (Exercise) 106 bpm 97 bpm 97 bpm 103 bpm 94 bpm   Heart Rate (Exit) 80 bpm 76 bpm 82 bpm 100 bpm 85 bpm    Oxygen Saturation (Admit) 93 % 93 % 94 % 93 % 98 %   Oxygen Saturation (Exercise) 86 % 95 % 95 % 93 % 96 %   Oxygen Saturation (Exit) 93 % 95 % 94 % 93 % 90 %   Rating of Perceived Exertion (Exercise) 14 13 13 13 14    Perceived Dyspnea (Exercise) 2 3 2 2 2    Symptoms SOB, chest tightness 3/10 -- -- -- --   Comments walk test results -- -- -- --   Duration -- Continue with 30 min of aerobic exercise without signs/symptoms of physical distress. Continue with 30 min of aerobic exercise without signs/symptoms of physical distress. Continue with 30 min of aerobic exercise without signs/symptoms of physical distress. Continue with 30 min of aerobic exercise without signs/symptoms of physical distress.   Intensity -- THRR unchanged THRR unchanged THRR unchanged THRR unchanged     Progression   Progression -- Continue to progress workloads to maintain intensity without signs/symptoms of physical distress. Continue to progress workloads to maintain intensity without signs/symptoms of physical distress. Continue to progress workloads to maintain intensity without signs/symptoms of physical distress. Continue to progress workloads to maintain intensity without signs/symptoms of physical distress.  Resistance Training   Training Prescription -- Yes Yes Yes Yes   Weight -- 3 4 4 4    Reps -- 10-15 10-15 10-15 10-15     Oxygen   Oxygen -- Continuous Continuous Continuous Continuous   Liters -- 3 3 4 3      NuStep   Level -- 3 3 2 2    SPM -- 78 91 80 84   Minutes -- 15 15 15 15    METs -- 1.8 2 1.9 1.9     Arm Ergometer   Level -- -- -- 1 2   Watts -- -- -- 50 52   Minutes -- -- -- 15 15   METs -- -- -- 1.8 1.9     REL-XR   Level -- 3 3 -- --   Speed -- 42 43 -- --   Minutes -- 15 15 -- --   METs -- 2 2.1 -- --     Oxygen   Maintain Oxygen Saturation -- 88% or higher 88% or higher 88% or higher 88% or higher    Row Name 09/07/23 1500 09/21/23 1500 10/05/23 1500         Response to  Exercise   Blood Pressure (Admit) 146/72 112/70 140/80     Blood Pressure (Exit) 136/70 138/70 140/72     Heart Rate (Admit) 88 bpm 91 bpm 87 bpm     Heart Rate (Exercise) 97 bpm 99 bpm 92 bpm     Heart Rate (Exit) 91 bpm 90 bpm 84 bpm     Oxygen Saturation (Admit) 93 % 89 % 93 %     Oxygen Saturation (Exercise) 94 % 93 % 95 %     Oxygen Saturation (Exit) 97 % 95 % 97 %     Rating of Perceived Exertion (Exercise) 12 12 13      Perceived Dyspnea (Exercise) 2 2 2      Duration Continue with 30 min of aerobic exercise without signs/symptoms of physical distress. Continue with 30 min of aerobic exercise without signs/symptoms of physical distress. Continue with 30 min of aerobic exercise without signs/symptoms of physical distress.     Intensity THRR unchanged THRR unchanged THRR unchanged       Progression   Progression Continue to progress workloads to maintain intensity without signs/symptoms of physical distress. Continue to progress workloads to maintain intensity without signs/symptoms of physical distress. Continue to progress workloads to maintain intensity without signs/symptoms of physical distress.       Resistance Training   Training Prescription Yes Yes Yes     Weight 5 5 5      Reps 10-15 10-15 10-15       Oxygen   Oxygen Continuous Continuous Continuous     Liters 3 3 3        NuStep   Level 3 3 3      SPM 80 72 92     Minutes 15 15 15      METs 2 2.1 2       Arm Ergometer   Level 2 1 --     Watts 52 45 --     Minutes 15 15 --     METs 1.9 1.8 --       REL-XR   Level -- -- 2     Speed -- -- 52     Minutes -- -- 15     METs -- -- 2.1       Oxygen   Maintain Oxygen Saturation 88% or  higher 88% or higher 88% or higher        Exercise Comments:   Exercise Comments     Row Name 05/11/23 1515 07/27/23 1403 10/14/23 1344       Exercise Comments First full day of exercise!  Patient was oriented to gym and equipment including functions, settings, policies, and  procedures.  Patient's individual exercise prescription and treatment plan were reviewed.  All starting workloads were established based on the results of the 6 minute walk test done at initial orientation visit.  The plan for exercise progression was also introduced and progression will be customized based on patient's performance and goals. Neven returned today. He had been out with a sore knee as it popped in class when standing the last time he was here at the beginning of the month. He strained the tendons and may have small tear but was told he could exercise with a supportive knee brace on during class. Kalyan graduated today from  rehab with 36 sessions completed.  Details of the patient's exercise prescription and what He needs to do in order to continue the prescription and progress were discussed with patient.  Patient was given a copy of prescription and goals.  Patient verbalized understanding. Chad Hensley plans to continue to exercise by exercising at home and joining a gym.        Exercise Goals and Review:   Exercise Goals     Row Name 05/03/23 1534             Exercise Goals   Increase Physical Activity Yes       Intervention Provide advice, education, support and counseling about physical activity/exercise needs.;Develop an individualized exercise prescription for aerobic and resistive training based on initial evaluation findings, risk stratification, comorbidities and participant's personal goals.       Expected Outcomes Short Term: Attend rehab on a regular basis to increase amount of physical activity.;Long Term: Add in home exercise to make exercise part of routine and to increase amount of physical activity.;Long Term: Exercising regularly at least 3-5 days a week.       Increase Strength and Stamina Yes       Intervention Provide advice, education, support and counseling about physical activity/exercise needs.;Develop an individualized exercise prescription for aerobic and resistive  training based on initial evaluation findings, risk stratification, comorbidities and participant's personal goals.       Expected Outcomes Short Term: Increase workloads from initial exercise prescription for resistance, speed, and METs.;Short Term: Perform resistance training exercises routinely during rehab and add in resistance training at home;Long Term: Improve cardiorespiratory fitness, muscular endurance and strength as measured by increased METs and functional capacity ( )       Able to understand and use rate of perceived exertion (RPE) scale Yes       Intervention Provide education and explanation on how to use RPE scale       Expected Outcomes Short Term: Able to use RPE daily in rehab to express subjective intensity level;Long Term:  Able to use RPE to guide intensity level when exercising independently       Able to understand and use Dyspnea scale Yes       Intervention Provide education and explanation on how to use Dyspnea scale       Expected Outcomes Short Term: Able to use Dyspnea scale daily in rehab to express subjective sense of shortness of breath during exertion;Long Term: Able to use Dyspnea scale to guide intensity level when  exercising independently       Knowledge and understanding of Target Heart Rate Range (THRR) Yes       Intervention Provide education and explanation of THRR including how the numbers were predicted and where they are located for reference       Expected Outcomes Short Term: Able to state/look up THRR;Long Term: Able to use THRR to govern intensity when exercising independently;Short Term: Able to use daily as guideline for intensity in rehab       Able to check pulse independently Yes       Intervention Review the importance of being able to check your own pulse for safety during independent exercise;Provide education and demonstration on how to check pulse in carotid and radial arteries.       Expected Outcomes Short Term: Able to explain why pulse  checking is important during independent exercise;Long Term: Able to check pulse independently and accurately       Understanding of Exercise Prescription Yes       Intervention Provide education, explanation, and written materials on patient's individual exercise prescription       Expected Outcomes Short Term: Able to explain program exercise prescription;Long Term: Able to explain home exercise prescription to exercise independently          Exercise Goals Re-Evaluation :  Exercise Goals Re-Evaluation     Row Name 05/11/23 1515 05/18/23 1332 06/29/23 1502 07/27/23 1424 07/29/23 0903     Exercise Goal Re-Evaluation   Exercise Goals Review Able to understand and use rate of perceived exertion (RPE) scale;Knowledge and understanding of Target Heart Rate Range (THRR);Understanding of Exercise Prescription;Able to understand and use Dyspnea scale Increase Physical Activity;Increase Strength and Stamina;Understanding of Exercise Prescription Increase Physical Activity;Increase Strength and Stamina;Understanding of Exercise Prescription Increase Physical Activity;Increase Strength and Stamina;Able to understand and use Dyspnea scale;Knowledge and understanding of Target Heart Rate Range (THRR);Able to understand and use rate of perceived exertion (RPE) scale;Able to check pulse independently;Understanding of Exercise Prescription Increase Physical Activity;Increase Strength and Stamina;Understanding of Exercise Prescription   Comments Reviewed RPE and dyspnea scale, THR and program prescription with pt today.  Pt voiced understanding and was given a copy of goals to take home. Chad Hensley is off to a good start in rehab.  He is already starting to feel better.  He finds that he is no longer sitting as much.  He does note some dry mouth and was encouraged to keep drinking water and exercising regularly.  He does have some weights and has already started to use them at home.  We will review home exercise after a  few more sessions. Chad Hensley is doing well in rehab. He is increasing his levels on the XR and Nustep and is tolerationg the resistance well. He has noticed his SOB getting better when exercising. Chad Hensley states he is doing some exercise at home such as exercising his R leg that was injured, lifting 5 pound weights and walking around outside. Chad Hensley is doing well in rehab. He recenty injured his R knee and had to move to the arm erogmeter instrad of using the seated elliptical.   Expected Outcomes Short: Use RPE daily to regulate intensity.  Long: Follow program prescription in THR. Short: Conitnue to move more Long: Continue to attend rehab regularly. Short: Conitnue to move more Long: Continue to attend rehab regularly. Short: Continue to move more. Long: Continue to attend rehab regularly. Continue to attend rehab    Row Name 08/03/23 1402 09/17/23 1947  Exercise Goal Re-Evaluation   Exercise Goals Review Increase Physical Activity;Increase Strength and Stamina;Understanding of Exercise Prescription Increase Physical Activity;Increase Strength and Stamina;Understanding of Exercise Prescription      Comments Chad Hensley is doing well in rehab.  He is working on getting back after hurting his knee.  It is still bothering him some and he has a MRI scheduled soon to get a better look at it since it is still causing some pain on the back side.  He is doing leg exercises to strenghten at home.  He is also using 5 lb weights for upper body. Chad Hensley is doing well in rehab. He has not been able to walk as much, but getting there.  He is doing leg exercises and using weights at home still.  He was encouraged to try some chair exercises as well      Expected Outcomes Short: Find out more about knee Long: COnitnue to exercise to build stamina SHort: Try out chair exercises Long Continue to improve stamina         Discharge Exercise Prescription (Final Exercise Prescription Changes):  Exercise Prescription Changes -  10/05/23 1500       Response to Exercise   Blood Pressure (Admit) 140/80    Blood Pressure (Exit) 140/72    Heart Rate (Admit) 87 bpm    Heart Rate (Exercise) 92 bpm    Heart Rate (Exit) 84 bpm    Oxygen Saturation (Admit) 93 %    Oxygen Saturation (Exercise) 95 %    Oxygen Saturation (Exit) 97 %    Rating of Perceived Exertion (Exercise) 13    Perceived Dyspnea (Exercise) 2    Duration Continue with 30 min of aerobic exercise without signs/symptoms of physical distress.    Intensity THRR unchanged      Progression   Progression Continue to progress workloads to maintain intensity without signs/symptoms of physical distress.      Resistance Training   Training Prescription Yes    Weight 5    Reps 10-15      Oxygen   Oxygen Continuous    Liters 3      NuStep   Level 3    SPM 92    Minutes 15    METs 2      REL-XR   Level 2    Speed 52    Minutes 15    METs 2.1      Oxygen   Maintain Oxygen Saturation 88% or higher          Nutrition:  Target Goals: Understanding of nutrition guidelines, daily intake of sodium 1500mg , cholesterol 200mg , calories 30% from fat and 7% or less from saturated fats, daily to have 5 or more servings of fruits and vegetables.  Biometrics:  Pre Biometrics - 05/03/23 1534       Pre Biometrics   Height 5' 6 (1.676 m)    Weight 83.7 kg    Waist Circumference 40.5 inches    Hip Circumference 40 inches    Waist to Hip Ratio 1.01 %    BMI (Calculated) 29.79    Grip Strength 32.1 kg    Single Leg Stand 4.4 seconds          Post Biometrics - 09/23/23 1353        Post  Biometrics   Height 5' 6 (1.676 m)    Weight 79.2 kg    Waist Circumference 42 inches    Hip Circumference 40 inches    Waist  to Hip Ratio 1.05 %    BMI (Calculated) 28.2    Grip Strength 24.7 kg          Nutrition Therapy Plan and Nutrition Goals:  Nutrition Therapy & Goals - 05/03/23 1535       Intervention Plan   Intervention Prescribe,  educate and counsel regarding individualized specific dietary modifications aiming towards targeted core components such as weight, hypertension, lipid management, diabetes, heart failure and other comorbidities.;Nutrition handout(s) given to patient.    Expected Outcomes Short Term Goal: Understand basic principles of dietary content, such as calories, fat, sodium, cholesterol and nutrients.;Long Term Goal: Adherence to prescribed nutrition plan.          Nutrition Assessments:  MEDIFICTS Score Key: >=70 Need to make dietary changes  40-70 Heart Healthy Diet <= 40 Therapeutic Level Cholesterol Diet  Flowsheet Row PULMONARY REHAB CHRONIC OBSTRUCTIVE PULMONARY DISEASE from 10/12/2023 in [facility]  Picture Your Plate Total Score on Discharge 71   Picture Your Plate Scores: <40 Unhealthy dietary pattern with much room for improvement. 41-50 Dietary pattern unlikely to meet recommendations for good health and room for improvement. 51-60 More healthful dietary pattern, with some room for improvement.  >60 Healthy dietary pattern, although there may be some specific behaviors that could be improved.    Nutrition Goals Re-Evaluation:  Nutrition Goals Re-Evaluation     Row Name 05/18/23 1339 06/29/23 1504 07/27/23 1418 08/03/23 1405 09/17/23 1949     Goals   Nutrition Goal Healthy Diet Healthy Diet Healthy Diet Short: continue to eat healthy. Long: Aim for a variety in foods. Short; Conitnue to add variety Long: Continue to eat healthy   Comment Chad Hensley is working on his diet.  He tries to stick to it as much as possible.  He aims to stay away from sweets and soda.  He wants to work on losing weight. He is doing well with a variety of fruits and vegetables.  He tries to get good proteins.  He really enjoys baked/broiled chicken with vegetables.  He is not a big breakfast eater and usually gets about two meals a day. Chad Hensley is continuing to work on his diet and  is working on  losing weight. Chad Hensley states he is currently eating healthy. Markevion continues to eat healthy.  He usually gets two main meals a day and both include protein.  He likes to do an egg salad sandwich for lunch and a big salad for dinner.  He loves to mix up his salads each day to get a good variety. Chad Hensley is doing better with eating.  He is getting  a good variety and adding in more protein.  He is pleased with his progress on getting more protein.   Expected Outcome Short: Conitnue to cut back on sweets Long: Aim for a variety. Short: Conitnue to cut back on sweets Long: Aim for a variety. Short: continue to eat healthy. Long: Aim for a variety in foods. Short; Conitnue to add variety Long: Continue to eat healthy Short; More vareity in protein Long: Continue to eat healthy      Nutrition Goals Discharge (Final Nutrition Goals Re-Evaluation):  Nutrition Goals Re-Evaluation - 09/17/23 1949       Goals   Nutrition Goal Short; Conitnue to add variety Long: Continue to eat healthy    Comment Chad Hensley is doing better with eating.  He is getting  a good variety and adding in more protein.  He is pleased with his progress  on getting more protein.    Expected Outcome Short; More vareity in protein Long: Continue to eat healthy          Psychosocial: Target Goals: Acknowledge presence or absence of significant depression and/or stress, maximize coping skills, provide positive support system. Participant is able to verbalize types and ability to use techniques and skills needed for reducing stress and depression.  Initial Review & Psychosocial Screening:  Initial Psych Review & Screening - 04/26/23 1340       Initial Review   Current issues with Current Sleep Concerns;Current Psychotropic Meds;History of Depression;Current Depression   PSTD with visual hallucinations     Family Dynamics   Good Support System? Yes    Comments His 2 daughters are his support.      Barriers   Psychosocial barriers to  participate in program The patient should benefit from training in stress management and relaxation.      Screening Interventions   Interventions Encouraged to exercise;To provide support and resources with identified psychosocial needs;Provide feedback about the scores to participant    Expected Outcomes Short Term goal: Utilizing psychosocial counselor, staff and physician to assist with identification of specific Stressors or current issues interfering with healing process. Setting desired goal for each stressor or current issue identified.;Long Term Goal: Stressors or current issues are controlled or eliminated.;Short Term goal: Identification and review with participant of any Quality of Life or Depression concerns found by scoring the questionnaire.;Long Term goal: The participant improves quality of Life and PHQ9 Scores as seen by post scores and/or verbalization of changes          Quality of Life Scores:  Scores of 19 and below usually indicate a poorer quality of life in these areas.  A difference of  2-3 points is a clinically meaningful difference.  A difference of 2-3 points in the total score of the Quality of Life Index has been associated with significant improvement in overall quality of life, self-image, physical symptoms, and general health in studies assessing change in quality of life.   PHQ-9: Review Flowsheet  More data may exist      10/12/2023 08/03/2023 07/06/2023 05/18/2023 05/03/2023  Depression screen PHQ 2/9  Decreased Interest 2 0 1 1 2   Down, Depressed, Hopeless 3 0 1 0 2  PHQ - 2 Score 5 0 2 1 4   Altered sleeping 2 1 0 0 2  Tired, decreased energy 2 1 1 3 3   Change in appetite 0 0 1 0 2  Feeling bad or failure about yourself  0 0 0 0 2  Trouble concentrating 2 1 1 2 3   Moving slowly or fidgety/restless 2 1 1 2 2   Suicidal thoughts 0 0 0 0 0  PHQ-9 Score 13 4 6 8 18   Difficult doing work/chores Somewhat difficult Somewhat difficult Somewhat difficult Somewhat  difficult Very difficult   Interpretation of Total Score  Total Score Depression Severity:  1-4 = Minimal depression, 5-9 = Mild depression, 10-14 = Moderate depression, 15-19 = Moderately severe depression, 20-27 = Severe depression   Psychosocial Evaluation and Intervention:  Psychosocial Evaluation - 04/26/23 1343       Psychosocial Evaluation & Interventions   Interventions Stress management education;Relaxation education;Encouraged to exercise with the program and follow exercise prescription    Comments Patient was referred to [place] with COPD from the [place]. He served in [employer] and does report some depression as well as PTSD with occasionally visual and auditory hallucinations which he says  is treated with Buspar. He says his daughter was living with him but she has moved out and he now lives alone which has been a diffucult ajustment for him. He has 2 daughters that do support him with his needs. He says he does attend church which helps him socially. He reports trouble staying asleep mainly due to feelings of choking on sinus drainage. He used to work painting houses and he thinks the fumes from the paint and exposure to chemicals while serving in the [employer] damaged his lungs. He is walking some at [facility] but says he can't do a lot. His goals for the program are to improve his strength and stamina and breathing. He has no barriers identified to completing the program.    Expected Outcomes Short Term: start the program and attend consistently. Long Term: meet his personal goals.    Continue Psychosocial Services  Follow up required by staff          Psychosocial Re-Evaluation:  Psychosocial Re-Evaluation     Row Name 05/18/23 1334 06/29/23 1504 07/07/23 1510 07/27/23 1417 08/03/23 1404     Psychosocial Re-Evaluation   Current issues with Current Stress Concerns;Current Depression;Current Psychotropic Meds Current Stress Concerns;Current Depression;Current Psychotropic Meds History of  Depression History of Depression History of Depression   Comments Montavis is doing well in rehab so far.  He is still seeing his counselor at the [place] every month for his PTSD.  He s already feeling better as he is moving more.  He is sleeping well for most part.  Overall, he is feeling better.  His PHQ has improved by 10 pts already, we will continue to check in with him.  He tries not to dwell on things too much and enjoyed stress education last week. Chad Hensley is doing well in rehab. He continuse to see his [place]. He is overall feeling better with exercise. Reviewed patient health questionnaire (PHQ-9) with patient for follow up. Previously, patient's score indicated signs/symptoms of depression. Reviewed to see if patient is improving symptom wise while in program. Score declined, when reading the questions to the pt I asked each questions different ways so the pt could understand what was being asked. Pt did not fully understand each question. Pt not having any current stressors, just his history of depression that he keeps taking his meds for. Reviewed patient health questionnaire (PHQ-9) with patient for follow up. Previously, patients score indicated signs/symptoms of depression.  Reviewed to see if patient is improving symptom wise while in program.  Score improved and patient states that it is because they have been moving more and sleeping better.   Expected Outcomes Short: Continue to exercise for mental boost Long: conitnue to work with PTSD Short: Continue to exercise for mental boost Long: conitnue to work with PTSD Short: Continue to work toward an improvement in PHQ9 scores by attending rehab regularly. Long: Continue to improve stress and depression coping skills by talking with staff and attending rehab regularly and work toward a positive mental state. Short: Continue to attend rehab. Long: Continue to improve stress levels. Short: Continue to attend rehab egularly for regular exercise and social  engagement. Long: Continue to improve symptoms and manage a positive mental state.   Interventions -- -- Encouraged to attend Pulmonary Rehabilitation for the exercise Stress management education;Relaxation education;Encouraged to attend Pulmonary Rehabilitation for the exercise Stress management education;Relaxation education;Encouraged to attend Pulmonary Rehabilitation for the exercise   Continue Psychosocial Services  -- -- Follow up required  by staff Follow up required by staff Follow up required by staff    Row Name 09/17/23 1948             Psychosocial Re-Evaluation   Current issues with History of Depression       Comments Chad Hensley is doing well in rehab.  He feels that he is in a better place mentally.  He is taking it one day at a time.  He is still frustrated with not being able to go and do as he used to, but better at coping with it now.       Expected Outcomes Short: Continue to attend rehab egularly for regular exercise and social engagement. Long: Continue to improve symptoms and manage a positive mental state.       Interventions Stress management education;Relaxation education;Encouraged to attend Pulmonary Rehabilitation for the exercise       Continue Psychosocial Services  Follow up required by staff          Psychosocial Discharge (Final Psychosocial Re-Evaluation):  Psychosocial Re-Evaluation - 09/17/23 1948       Psychosocial Re-Evaluation   Current issues with History of Depression    Comments Chad Hensley is doing well in rehab.  He feels that he is in a better place mentally.  He is taking it one day at a time.  He is still frustrated with not being able to go and do as he used to, but better at coping with it now.    Expected Outcomes Short: Continue to attend rehab egularly for regular exercise and social engagement. Long: Continue to improve symptoms and manage a positive mental state.    Interventions Stress management education;Relaxation education;Encouraged to attend  Pulmonary Rehabilitation for the exercise    Continue Psychosocial Services  Follow up required by staff           Education: Education Goals: Education classes will be provided on a weekly basis, covering required topics. Participant will state understanding/return demonstration of topics presented.  Learning Barriers/Preferences:  Learning Barriers/Preferences - 04/26/23 1327       Learning Barriers/Preferences   Learning Barriers None    Learning Preferences Skilled Demonstration;Audio;Written Material          Education Topics: How Lungs Work and Diseases: - Discuss the anatomy of the lungs and diseases that can affect the lungs, such as COPD. Flowsheet Row PULMONARY REHAB CHRONIC OBSTRUCTIVE PULMONARY DISEASE from 10/14/2023 in [facility]  Date 06/03/23  Educator jh  Instruction Review Code 1- Verbalizes Understanding    Exercise: -Discuss the importance of exercise, FITT principles of exercise, normal and abnormal responses to exercise, and how to exercise safely.   Environmental Irritants: -Discuss types of environmental irritants and how to limit exposure to environmental irritants.   Meds/Inhalers and oxygen: - Discuss respiratory medications, definition of an inhaler and oxygen, and the proper way to use an inhaler and oxygen.   Energy Saving Techniques: - Discuss methods to conserve energy and decrease shortness of breath when performing activities of daily living.    Bronchial Hygiene / Breathing Techniques: - Discuss breathing mechanics, pursed-lip breathing technique,  proper posture, effective ways to clear airways, and other functional breathing techniques   Cleaning Equipment: - Provides group verbal and written instruction about the health risks of elevated stress, cause of high stress, and healthy ways to reduce stress.   Nutrition I: Fats: - Discuss the types of cholesterol, what cholesterol does to the body, and how  cholesterol levels  can be controlled. Flowsheet Row PULMONARY REHAB CHRONIC OBSTRUCTIVE PULMONARY DISEASE from 10/14/2023 in [place] [facility]  Date 09/09/23  Educator HB  Instruction Review Code 1- Verbalizes Understanding    Nutrition II: Labels: -Discuss the different components of food labels and how to read food labels. Flowsheet Row PULMONARY REHAB CHRONIC OBSTRUCTIVE PULMONARY DISEASE from 10/14/2023 in [place] [facility]  Date 09/09/23  Educator HB  Instruction Review Code 1- Verbalizes Understanding    Respiratory Infections: - Discuss the signs and symptoms of respiratory infections, ways to prevent respiratory infections, and the importance of seeking medical treatment when having a respiratory infection.   Stress I: Signs and Symptoms: - Discuss the causes of stress, how stress may lead to anxiety and depression, and ways to limit stress. Flowsheet Row PULMONARY REHAB CHRONIC OBSTRUCTIVE PULMONARY DISEASE from 10/14/2023 in [place] [facility]  Date 05/13/23  Educator [facility]  Instruction Review Code 1- Verbalizes Understanding    Stress II: Relaxation: -Discuss relaxation techniques to limit stress. Flowsheet Row PULMONARY REHAB CHRONIC OBSTRUCTIVE PULMONARY DISEASE from 10/14/2023 in [place] [facility]  Date 07/01/23  Educator [facility]  Instruction Review Code 1- Verbalizes Understanding    Oxygen for Home/Travel: - Discuss how to prepare for travel when on oxygen and proper ways to transport and store oxygen to ensure safety.   Knowledge Questionnaire Score:  Knowledge Questionnaire Score - 10/12/23 1308       Knowledge Questionnaire Score   Post Score 14/18          Core Components/Risk Factors/Patient Goals at Admission:  Personal Goals and Risk Factors at Admission - 05/03/23 1535       Core Components/Risk Factors/Patient Goals on Admission    Weight Management Yes;Weight Loss    Intervention  Weight Management: Develop a combined nutrition and exercise program designed to reach desired caloric intake, while maintaining appropriate intake of nutrient and fiber, sodium and fats, and appropriate energy expenditure required for the weight goal.;Weight Management: Provide education and appropriate resources to help participant work on and attain dietary goals.;Weight Management/Obesity: Establish reasonable short term and long term weight goals.    Admit Weight 184 lb 8 oz (83.7 kg)    Goal Weight: Short Term 179 lb (81.2 kg)    Goal Weight: Long Term 165 lb (74.8 kg)    Expected Outcomes Short Term: Continue to assess and modify interventions until short term weight is achieved;Long Term: Adherence to nutrition and physical activity/exercise program aimed toward attainment of established weight goal;Weight Loss: Understanding of general recommendations for a balanced deficit meal plan, which promotes 1-2 lb weight loss per week and includes a negative energy balance of [phone] kcal/d;Understanding recommendations for meals to include 15-35% energy as protein, 25-35% energy from fat, 35-60% energy from carbohydrates, less than 200mg  of dietary cholesterol, 20-35 gm of total fiber daily;Understanding of distribution of calorie intake throughout the day with the consumption of 4-5 meals/snacks    Improve shortness of breath with ADL's Yes    Expected Outcomes Short Term: Improve cardiorespiratory fitness to achieve a reduction of symptoms when performing ADLs;Long Term: Be able to perform more ADLs without symptoms or delay the onset of symptoms    Increase knowledge of respiratory medications and ability to use respiratory devices properly  Yes    Intervention Provide education and demonstration as needed of appropriate use of medications, inhalers, and oxygen therapy.    Expected Outcomes Short Term: Achieves understanding of medications use. Understands that oxygen is  a medication prescribed by  physician. Demonstrates appropriate use of inhaler and oxygen therapy.;Long Term: Maintain appropriate use of medications, inhalers, and oxygen therapy.    Intervention Provide education on lifestyle modifcations including regular physical activity/exercise, weight management, moderate sodium restriction and increased consumption of fresh fruit, vegetables, and low fat dairy, alcohol moderation, and smoking cessation.;Monitor prescription use compliance.    Expected Outcomes Short Term: Continued assessment and intervention until BP is < 140/65mm HG in hypertensive participants. < 130/51mm HG in hypertensive participants with diabetes, heart failure or chronic kidney disease.;Long Term: Maintenance of blood pressure at goal levels.    Lipids Yes    Intervention Provide education and support for participant on nutrition & aerobic/resistive exercise along with prescribed medications to achieve LDL 70mg , HDL >40mg .    Expected Outcomes Short Term: Participant states understanding of desired cholesterol values and is compliant with medications prescribed. Participant is following exercise prescription and nutrition guidelines.;Long Term: Cholesterol controlled with medications as prescribed, with individualized exercise RX and with personalized nutrition plan. Value goals: LDL < 70mg , HDL > 40 mg.          Core Components/Risk Factors/Patient Goals Review:   Goals and Risk Factor Review     Row Name 05/18/23 1341 06/29/23 1505 07/27/23 1419 08/03/23 1406 09/17/23 1950     Core Components/Risk Factors/Patient Goals Review   Personal Goals Review Weight Management/Obesity;Improve shortness of breath with ADL's;Increase knowledge of respiratory medications and ability to use respiratory devices properly.;Hypertension;Lipids Weight Management/Obesity;Improve shortness of breath with ADL's;Increase knowledge of respiratory medications and ability to use respiratory devices properly.;Hypertension;Lipids  Weight Management/Obesity;Improve shortness of breath with ADL's;Increase knowledge of respiratory medications and ability to use respiratory devices properly.;Hypertension;Lipids Weight Management/Obesity;Improve shortness of breath with ADL's;Increase knowledge of respiratory medications and ability to use respiratory devices properly.;Hypertension;Lipids Weight Management/Obesity;Improve shortness of breath with ADL's;Increase knowledge of respiratory medications and ability to use respiratory devices properly.;Hypertension;Lipids   Review Chad Hensley is doing well in rehab.  He wants to lose more weight is working on continuing to improve his diet to help.  His breathing has gotten better as he is not sitting as much anymore.  His blood pressures are doing well and he does check them at home.  He keeps a record to show his doctors regularly.  He was at [facility] yesterday for a general f/u and is doing well.  His breathing is doing well, but has some rougher days.  He is doing well with his meds.  He is good about wearing his oxygen when out and about but not out to [employer].  He was encouraged to wear it regularly to maintain saturations. Chad Hensley continues to do well in rehab. He is watching what he eats so he is able to lose weight and improve his diet. His blood pressure continues to do well in rehab and his oxygen is staying WNL during exercise. His is working on his PLB when in rehab=. Demtrius is coming back from being out of rehab due to a knee injury. He is taking all of his medications as prescribed, especially his respiratory medications. He is eager to continue working in rehab. Victory continues to work on [employer] loss.  He is also watching his knee and has an MRI scheduled.  His breathing is doing better overall.  His pressures are still running on the higher side but he is keeping eye on it and his daughter is also watching it too. Reynold is doing well.  His weight is trending down.  His knee is feeling better.  His  breathing has improved and pressures have leveled back out.   Expected Outcomes Short: Continue to work on weight loss.  Long: Continue to montior risk factors. Short: Continue to work on weight loss.  Long: Continue to montior risk factors. Short: Continue to work on weight loss and building strength.  Long: Continue to monitor risk factors. Short: Continue to work on blood pressures Long: Conitnue to monitor risk factors. Short; Continue to keep close eye on pressures Long: contineu to improve breathing      Core Components/Risk Factors/Patient Goals at Discharge (Final Review):   Goals and Risk Factor Review - 09/17/23 1950       Core Components/Risk Factors/Patient Goals Review   Personal Goals Review Weight Management/Obesity;Improve shortness of breath with ADL's;Increase knowledge of respiratory medications and ability to use respiratory devices properly.;Hypertension;Lipids    Review Chad Hensley is doing well.  His weight is trending down.  His knee is feeling better.  His breathing has improved and pressures have leveled back out.    Expected Outcomes Short; Continue to keep close eye on pressures Long: contineu to improve breathing          ITP Comments:  ITP Comments     Row Name 05/03/23 1530 05/04/23 1436 05/11/23 1515 06/02/23 1138 06/30/23 1349   ITP Comments Patient attend orientation today.  Patient is attending Pulmonary Rehabilitation Program.  Documentation for diagnosis can be found in [place] notes in [place] for today's appointment.  Reviewed medical chart, RPE/RPD, gym safety, and program guidelines.  Patient was fitted to equipment they will be using during rehab.  Patient is scheduled to start exercise on Tues 05/11/23 at 1330.   Initial ITP created and sent for review and signature by Dr. Gwendalyn Lemma, Medical Director for Pulmonary Rehabilitation Program. 30 day review completed. ITP sent to Dr.Jehanzeb Memon, Medical Director of  Pulmonary Rehab. Continue with ITP unless changes  are made by physician.  Only attended orientation yesterday. First full day of exercise!  Patient was oriented to gym and equipment including functions, settings, policies, and procedures.  Patient's individual exercise prescription and treatment plan were reviewed.  All starting workloads were established based on the results of the 6 minute walk test done at initial orientation visit.  The plan for exercise progression was also introduced and progression will be customized based on patient's performance and goals. 30 day review completed. ITP sent to Dr.Jehanzeb Memon, Medical Director of  Pulmonary Rehab. Continue with ITP unless changes are made by physician. 30 day review completed. ITP sent to Dr.Jehanzeb Memon, Medical Director of  Pulmonary Rehab. Continue with ITP unless changes are made by physician.    Row Name 07/27/23 1401 07/28/23 1151 08/25/23 1452 09/22/23 1133 10/14/23 1344   ITP Comments Chad Hensley returned today.  He had been out with a sore knee as it popped in class when standing the last time he was here at the beginning of the month.  He strained the tendons and may  have small tear but was told he could exercise with a supportive knee brace on during class. 30 day review completed. ITP sent to Dr.Jehanzeb Memon, Medical Director of  Pulmonary Rehab. Continue with ITP unless changes are made by physician. 30 day review completed. ITP sent to Dr.Jehanzeb Memon, Medical Director of  Pulmonary Rehab. Continue with ITP unless changes are made by physician. 30 day review completed. ITP sent to Dr.Jehanzeb Memon, Medical Director of  Pulmonary Rehab. Continue  with ITP unless changes are made by physician. Chad Hensley graduated today from  rehab with 36 sessions completed.  Details of the patient's exercise prescription and what He needs to do in order to continue the prescription and progress were discussed with patient.  Patient was given a copy of prescription and goals.  Patient verbalized understanding.  Ramir plans to continue to exercise by exercising at home and joining a gym.      Comments: discharge ITP

## 2023-10-19 ENCOUNTER — Encounter (HOSPITAL_COMMUNITY)

## 2023-10-21 ENCOUNTER — Encounter (HOSPITAL_COMMUNITY)

## 2023-10-26 ENCOUNTER — Encounter (HOSPITAL_COMMUNITY)

## 2023-10-28 ENCOUNTER — Encounter (HOSPITAL_COMMUNITY)

## 2023-11-02 ENCOUNTER — Encounter (HOSPITAL_COMMUNITY)

## 2023-11-04 ENCOUNTER — Encounter (HOSPITAL_COMMUNITY)

## 2024-01-26 ENCOUNTER — Observation Stay (HOSPITAL_COMMUNITY)
Admission: EM | Admit: 2024-01-26 | Discharge: 2024-01-31 | Disposition: A | Discharge status: Skilled Nursing Facility | Attending: Emergency Medicine | Admitting: Emergency Medicine

## 2024-01-26 ENCOUNTER — Encounter (HOSPITAL_COMMUNITY): Payer: Self-pay | Admitting: Emergency Medicine

## 2024-01-26 ENCOUNTER — Emergency Department (HOSPITAL_COMMUNITY)

## 2024-01-26 ENCOUNTER — Other Ambulatory Visit: Payer: Self-pay

## 2024-01-26 DIAGNOSIS — I110 Hypertensive heart disease with heart failure: Secondary | ICD-10-CM | POA: Diagnosis not present

## 2024-01-26 DIAGNOSIS — Z79899 Other long term (current) drug therapy: Secondary | ICD-10-CM | POA: Diagnosis not present

## 2024-01-26 DIAGNOSIS — R54 Age-related physical debility: Secondary | ICD-10-CM | POA: Insufficient documentation

## 2024-01-26 DIAGNOSIS — K8020 Calculus of gallbladder without cholecystitis without obstruction: Secondary | ICD-10-CM | POA: Insufficient documentation

## 2024-01-26 DIAGNOSIS — J439 Emphysema, unspecified: Secondary | ICD-10-CM | POA: Diagnosis not present

## 2024-01-26 DIAGNOSIS — J449 Chronic obstructive pulmonary disease, unspecified: Secondary | ICD-10-CM | POA: Diagnosis not present

## 2024-01-26 DIAGNOSIS — K449 Diaphragmatic hernia without obstruction or gangrene: Secondary | ICD-10-CM | POA: Insufficient documentation

## 2024-01-26 DIAGNOSIS — I5030 Unspecified diastolic (congestive) heart failure: Secondary | ICD-10-CM | POA: Diagnosis not present

## 2024-01-26 DIAGNOSIS — I700 Atherosclerosis of aorta: Secondary | ICD-10-CM | POA: Diagnosis not present

## 2024-01-26 DIAGNOSIS — W1830XA Fall on same level, unspecified, initial encounter: Secondary | ICD-10-CM | POA: Insufficient documentation

## 2024-01-26 DIAGNOSIS — F39 Unspecified mood [affective] disorder: Secondary | ICD-10-CM | POA: Diagnosis not present

## 2024-01-26 DIAGNOSIS — R441 Visual hallucinations: Secondary | ICD-10-CM | POA: Diagnosis not present

## 2024-01-26 DIAGNOSIS — I5A Non-ischemic myocardial injury (non-traumatic): Secondary | ICD-10-CM | POA: Diagnosis not present

## 2024-01-26 DIAGNOSIS — R339 Retention of urine, unspecified: Secondary | ICD-10-CM | POA: Diagnosis not present

## 2024-01-26 DIAGNOSIS — K5730 Diverticulosis of large intestine without perforation or abscess without bleeding: Secondary | ICD-10-CM | POA: Diagnosis not present

## 2024-01-26 DIAGNOSIS — R42 Dizziness and giddiness: Secondary | ICD-10-CM | POA: Diagnosis present

## 2024-01-26 DIAGNOSIS — R7989 Other specified abnormal findings of blood chemistry: Secondary | ICD-10-CM | POA: Diagnosis not present

## 2024-01-26 DIAGNOSIS — D72829 Elevated white blood cell count, unspecified: Secondary | ICD-10-CM | POA: Insufficient documentation

## 2024-01-26 DIAGNOSIS — E785 Hyperlipidemia, unspecified: Secondary | ICD-10-CM | POA: Diagnosis not present

## 2024-01-26 DIAGNOSIS — I7143 Infrarenal abdominal aortic aneurysm, without rupture: Secondary | ICD-10-CM | POA: Diagnosis not present

## 2024-01-26 DIAGNOSIS — R5381 Other malaise: Secondary | ICD-10-CM | POA: Diagnosis present

## 2024-01-26 DIAGNOSIS — Z7982 Long term (current) use of aspirin: Secondary | ICD-10-CM | POA: Insufficient documentation

## 2024-01-26 DIAGNOSIS — W19XXXA Unspecified fall, initial encounter: Secondary | ICD-10-CM | POA: Diagnosis not present

## 2024-01-26 DIAGNOSIS — R6510 Systemic inflammatory response syndrome (SIRS) of non-infectious origin without acute organ dysfunction: Secondary | ICD-10-CM | POA: Insufficient documentation

## 2024-01-26 DIAGNOSIS — R251 Tremor, unspecified: Secondary | ICD-10-CM | POA: Insufficient documentation

## 2024-01-26 DIAGNOSIS — R443 Hallucinations, unspecified: Secondary | ICD-10-CM | POA: Diagnosis present

## 2024-01-26 DIAGNOSIS — S0990XA Unspecified injury of head, initial encounter: Secondary | ICD-10-CM | POA: Diagnosis not present

## 2024-01-26 LAB — TROPONIN I (HIGH SENSITIVITY)
Troponin I (High Sensitivity): 55 ng/L — ABNORMAL HIGH (ref <18)
Troponin I (High Sensitivity): 58 ng/L — ABNORMAL HIGH (ref <18)

## 2024-01-26 LAB — CBC WITH DIFFERENTIAL/PLATELET
Abs Immature Granulocytes: 0.06 K/uL (ref 0.00–0.07)
Basophils Absolute: 0 K/uL (ref 0.0–0.1)
Basophils Relative: 0 %
Eosinophils Absolute: 0 K/uL (ref 0.0–0.5)
Eosinophils Relative: 0 %
HCT: 45.8 % (ref 39.0–52.0)
Hemoglobin: 15.7 g/dL (ref 13.0–17.0)
Immature Granulocytes: 0 %
Lymphocytes Relative: 7 %
Lymphs Abs: 1.2 K/uL (ref 0.7–4.0)
MCH: 30.8 pg (ref 26.0–34.0)
MCHC: 34.3 g/dL (ref 30.0–36.0)
MCV: 89.8 fL (ref 80.0–100.0)
Monocytes Absolute: 1.6 K/uL — ABNORMAL HIGH (ref 0.1–1.0)
Monocytes Relative: 10 %
Neutro Abs: 12.9 K/uL — ABNORMAL HIGH (ref 1.7–7.7)
Neutrophils Relative %: 83 %
Platelets: 263 K/uL (ref 150–400)
RBC: 5.1 MIL/uL (ref 4.22–5.81)
RDW: 13.7 % (ref 11.5–15.5)
WBC: 15.7 K/uL — ABNORMAL HIGH (ref 4.0–10.5)
nRBC: 0 % (ref 0.0–0.2)

## 2024-01-26 LAB — URINALYSIS, ROUTINE W REFLEX MICROSCOPIC
Bilirubin Urine: NEGATIVE
Glucose, UA: NEGATIVE mg/dL
Hgb urine dipstick: NEGATIVE
Ketones, ur: NEGATIVE mg/dL
Leukocytes,Ua: NEGATIVE
Nitrite: NEGATIVE
Protein, ur: NEGATIVE mg/dL
Specific Gravity, Urine: 1.005 (ref 1.005–1.030)
pH: 7 (ref 5.0–8.0)

## 2024-01-26 LAB — PROTIME-INR
INR: 1.1 (ref 0.8–1.2)
Prothrombin Time: 14.7 s (ref 11.4–15.2)

## 2024-01-26 LAB — COMPREHENSIVE METABOLIC PANEL WITH GFR
ALT: 22 U/L (ref 0–44)
AST: 28 U/L (ref 15–41)
Albumin: 3.9 g/dL (ref 3.5–5.0)
Alkaline Phosphatase: 82 U/L (ref 38–126)
Anion gap: 12 (ref 5–15)
BUN: 13 mg/dL (ref 8–23)
CO2: 25 mmol/L (ref 22–32)
Calcium: 9.4 mg/dL (ref 8.9–10.3)
Chloride: 105 mmol/L (ref 98–111)
Creatinine, Ser: 1.11 mg/dL (ref 0.61–1.24)
GFR, Estimated: >60 mL/min (ref >60)
Glucose, Bld: 99 mg/dL (ref 70–99)
Potassium: 4.1 mmol/L (ref 3.5–5.1)
Sodium: 142 mmol/L (ref 135–145)
Total Bilirubin: 1.3 mg/dL — ABNORMAL HIGH (ref 0.0–1.2)
Total Protein: 6.5 g/dL (ref 6.5–8.1)

## 2024-01-26 LAB — CK: Total CK: 271 U/L (ref 49–397)

## 2024-01-26 LAB — CBG MONITORING, ED: Glucose-Capillary: 93 mg/dL (ref 70–99)

## 2024-01-26 LAB — LACTIC ACID, PLASMA: Lactic Acid, Venous: 1.4 mmol/L (ref 0.5–1.9)

## 2024-01-26 MED ORDER — LACTATED RINGERS IV BOLUS (SEPSIS)
1000.0000 mL | Freq: Once | INTRAVENOUS | Status: AC
  Administered 2024-01-26: 1000 mL via INTRAVENOUS

## 2024-01-26 MED ORDER — METRONIDAZOLE 500 MG/100ML IV SOLN
500.0000 mg | Freq: Once | INTRAVENOUS | Status: DC
Start: 2024-01-26 — End: 2024-01-27
  Filled 2024-01-26: qty 100

## 2024-01-26 MED ORDER — IOHEXOL 300 MG/ML  SOLN
100.0000 mL | Freq: Once | INTRAMUSCULAR | Status: AC | PRN
  Administered 2024-01-26: 100 mL via INTRAVENOUS

## 2024-01-26 MED ORDER — DOUBLE ANTIBIOTIC 500-10000 UNIT/GM EX OINT
TOPICAL_OINTMENT | Freq: Once | CUTANEOUS | Status: AC
  Filled 2024-01-26: qty 2

## 2024-01-26 MED ORDER — SODIUM CHLORIDE 0.9 % IV SOLN
2.0000 g | Freq: Once | INTRAVENOUS | Status: AC
  Administered 2024-01-26: 2 g via INTRAVENOUS
  Filled 2024-01-26: qty 12.5

## 2024-01-26 NOTE — ED Triage Notes (Signed)
 Pt lives at home and states he tripped and fell. Pt called c com and not 911. Neighbor came to check on him and didn't come to door, law enforcement went in to find pt on floor  but a/o then they called 911. Cbg 114. Pt arrived a/o. Color wnl. On chornic 02 2L [place]. Abrasions/skin tears noted. Pt denies loc or vomiting.

## 2024-01-26 NOTE — ED Notes (Addendum)
 Daughter called and stated pt has been seeing people due to his PTSD for about 6 months. Meds were changed but no better. When asked pt, he did states at night he sees people trying to come in his bedroom. States at the time he thinks it is real but knows it is not. Pt Is a/o at this time.  Pt spine palpated with no tenderness.

## 2024-01-26 NOTE — Sepsis Progress Note (Signed)
 Elink monitoring for the code sepsis protocol.

## 2024-01-26 NOTE — ED Notes (Signed)
 ED Provider at bedside.

## 2024-01-26 NOTE — ED Provider Notes (Signed)
 [place] [facility] AT [facility] Provider Note   CSN: [id number] Arrival date & time: 01/26/24  2004     History  Chief Complaint  Patient presents with   Chad Hensley is a 73 y.o. male with PMH as listed below who presents with fall. Pt lives at home and states he tripped and fell. Unsure if he hit his head.  Neighbor came to check on him and didn't come to door, law enforcement went in to find pt on floor  but a/o then they called 911. Cbg 114. On chornic 02 2L [place]. Abrasions/skin tears noted to BL forearms. Pt denies loc or vomiting. He states he tripped over something in his house but isn't sure if he hit his head. Unsure how long he laid on the floor and he was too weak to get up. Denies chest pain, shortness of breath. Has had a cough recently with a chest cold but that got better about a week ago. Denies dysuria/hematuria. Endorses lower back pain in the midline since falling. .  Daughter called and stated pt has been seeing people due to his PTSD for about 6 months. Meds were changed but hasn't helped him much. When asked pt, he did states at night he sees people trying to come in his bedroom. States at the time he thinks it is real but knows it is not. Pt Is a/ox4 at this time.    Past Medical History:  Diagnosis Date   COPD (chronic obstructive pulmonary disease) (HCC)    Hypertension    PTSD (post-traumatic stress disorder) 05/04/1976       Home Medications Prior to Admission medications   Medication Sig Start Date End Date Taking? Authorizing Provider  ALBUTEROL  IN Inhale 2 puffs into the lungs as needed.   Yes [provider]  atorvastatin  (LIPITOR) 20 MG tablet Take 20 mg by mouth daily.   Yes [provider]  buPROPion  (WELLBUTRIN  XL) 300 MG 24 hr tablet Take 300 mg by mouth daily. 07/13/23  Yes [provider]  carvedilol  (COREG ) 12.5 MG tablet Take 1 tablet (12.5 mg total) by mouth 2 (two) times daily with  a meal. 02/17/22  Yes Ricky Fines, MD  furosemide  (LASIX ) 20 MG tablet Take 1 tablet (20 mg total) by mouth daily. 08/22/22 01/27/24 Yes Drusilla Sabas RAMAN, MD  hydrOXYzine  (ATARAX ) 10 MG tablet Take 5 mg by mouth 2 (two) times daily as needed for anxiety.   Yes [provider]  isosorbide  mononitrate (IMDUR ) 30 MG 24 hr tablet Take 1 tablet (30 mg total) by mouth daily. 02/18/22  Yes Ricky Fines, MD  Omega-3 Fatty Acids (FISH OIL PO) Take 1 capsule by mouth 2 (two) times daily.   Yes [provider]  omeprazole (PRILOSEC) 40 MG capsule Take 40 mg by mouth daily.   Yes [provider]  potassium chloride  (KLOR-CON ) 10 MEQ tablet Take 10 mEq by mouth daily. 03/23/23  Yes [provider]  Tiotropium Bromide-Olodaterol (STIOLTO RESPIMAT) 2.5-2.5 MCG/ACT AERS Inhale into the lungs daily.   Yes [provider]      Allergies    Antihistamines, diphenhydramine-type and Codeine    Review of Systems   Review of Systems A 10 point review of systems was performed and is negative unless otherwise reported in HPI.  Physical Exam Updated Vital Signs BP (!) 158/102   Pulse (!) 106   Temp 98.4 F (36.9 C) (Oral)   Resp 16  Ht 5' 7 (1.702 m)   SpO2 92%   BMI 27.35 kg/m  Physical Exam General: Normal appearing elderly male, lying in bed.  HEENT: PERRLA, EOMI, no facial trauma, Sclera anicteric, MMM, trachea midline. No Midline C spine Ttp.  Cardiology: RRR, no murmurs/rubs/gallops. No chest wall TTP or signs of trauma.  Resp: Normal respiratory rate and effort. CTAB, no wheezes, rhonchi, crackles.  Abd: Soft, non-tender, non-distended. No rebound tenderness or guarding.  Pelvis: pelvis stable/non-tender MSK: hemostatic small skin tears on BL skin tears. No peripheral edema or signs of trauma. Extremities without deformity or TTP. No cyanosis or clubbing. Skin: warm, dry. Back: +Midline T and L spine TTP without deformities or stepoffs.  Neuro: A&Ox4,  CNs II-XII grossly intact. MAEs. Sensation grossly intact.  Psych: Normal mood and affect.   ED Results / Procedures / Treatments   Labs (all labs ordered are listed, but only abnormal results are displayed) Labs Reviewed  CBC WITH DIFFERENTIAL/PLATELET - Abnormal; Notable for the following components:      Result Value   WBC 15.7 (*)    Neutro Abs 12.9 (*)    Monocytes Absolute 1.6 (*)    All other components within normal limits  COMPREHENSIVE METABOLIC PANEL WITH GFR - Abnormal; Notable for the following components:   Total Bilirubin 1.3 (*)    All other components within normal limits  BASIC METABOLIC PANEL WITH GFR - Abnormal; Notable for the following components:   Glucose, Bld 114 (*)    All other components within normal limits  CBC - Abnormal; Notable for the following components:   WBC 12.5 (*)    All other components within normal limits  BLOOD GAS, VENOUS - Abnormal; Notable for the following components:   pO2, Ven <31 (*)    Bicarbonate 30.4 (*)    Acid-Base Excess 4.6 (*)    All other components within normal limits  GLUCOSE, CAPILLARY - Abnormal; Notable for the following components:   Glucose-Capillary 162 (*)    All other components within normal limits  TROPONIN I (HIGH SENSITIVITY) - Abnormal; Notable for the following components:   Troponin I (High Sensitivity) 55 (*)    All other components within normal limits  TROPONIN I (HIGH SENSITIVITY) - Abnormal; Notable for the following components:   Troponin I (High Sensitivity) 58 (*)    All other components within normal limits  RESP PANEL BY RT-PCR (RSV, FLU A&B, COVID)  RVPGX2  CULTURE, BLOOD (ROUTINE X 2)  CULTURE, BLOOD (ROUTINE X 2)  RESPIRATORY PANEL BY PCR  URINALYSIS, ROUTINE W REFLEX MICROSCOPIC  CK  LACTIC ACID, PLASMA  PROTIME-INR  MAGNESIUM  PHOSPHORUS  TSH  AMMONIA  CBG MONITORING, ED    EKG EKG Interpretation Date/Time:  Wednesday January 26 2024 22:55:38 EDT Ventricular Rate:   103 PR Interval:  180 QRS Duration:  102 QT Interval:  370 QTC Calculation: 485 R Axis:   -12  Text Interpretation: Sinus tachycardia Borderline low voltage, extremity leads Borderline prolonged QT interval Confirmed by Geroldine Berg ([id number]) on 01/26/2024 11:21:17 PM  Radiology CT HEAD WO CONTRAST ( ) Result Date: 01/29/2024 CLINICAL DATA:  Initial evaluation for acute trauma, unwitnessed fall. EXAM: CT HEAD WITHOUT CONTRAST TECHNIQUE: Contiguous axial images were obtained from the base of the skull through the vertex without intravenous contrast. RADIATION DOSE REDUCTION: This exam was performed according to the departmental dose-optimization program which includes automated exposure control, adjustment of the mA and/or kV according to patient size and/or use of iterative reconstruction  technique. COMPARISON:  CT from 01/26/2024. FINDINGS: Brain: Mild age-related cerebral atrophy. Patchy hypodensity involving the supratentorial cerebral white matter, consistent with chronic small vessel ischemic disease. No acute intracranial hemorrhage. No acute large vessel territory infarct. No mass lesion or midline shift. No hydrocephalus or extra-axial fluid collection. Vascular: No abnormal hyperdense vessel. Scattered vascular calcifications noted within the carotid siphons. Skull: Scalp soft tissues demonstrate no acute finding. Calvarium intact. Sinuses/Orbits: Globes orbital soft tissues within normal limits. Paranasal sinuses are largely clear. No mastoid effusion. Other: None. IMPRESSION: 1. No acute intracranial abnormality. 2. Mild age-related cerebral atrophy with chronic small vessel ischemic disease. Electronically Signed   By: Morene Hoard M.D.   On: 01/29/2024 00:04    Procedures Procedures    Medications Ordered in ED Medications  polymixin-bacitracin  (POLYSPORIN ) ointment ( Topical Given 01/26/24 2154)  lactated ringers  bolus 1,000 mL (0 mLs Intravenous Stopped 01/27/24 0208)   ceFEPIme  (MAXIPIME ) 2 g in sodium chloride  0.9 % 100 mL IVPB (0 g Intravenous Stopped 01/27/24 0039)  iohexol  (OMNIPAQUE ) 300 MG/ML solution 100 mL (100 mLs Intravenous Contrast Given 01/26/24 2359)    ED Course/ Medical Decision Making/ A&P                          Medical Decision Making Amount and/or Complexity of Data Reviewed Labs: ordered. Decision-making details documented in ED Course. Radiology: ordered. Decision-making details documented in ED Course.  Risk OTC drugs. Prescription drug management. Decision regarding hospitalization.    This patient presents to the ED for concern of fall at home, generalized weakness, this involves an extensive number of treatment options, and is a complaint that carries with it a high risk of complications and morbidity.  I considered the following differential and admission for this acute, potentially life threatening condition.   MDM:    Patient with fall and trauma, he does have tachycardia, tachypnea, concern for sepsis.  He has a reassuringly negative traumatic workup including CT head and CT total spine, chest x-ray and pelvis x-ray.  Notes subcutaneous gas in the left inguinal region.  Physical exam performed with nurse chaperone does not demonstrate any overlying skin changes including erythema or induration, fluctuance, or tenderness palpation.  He has a normal circumcised genitalia with no scrotal swelling or tenderness either.  No concern for Fournier's gangrene or gas-forming organism.  He did report chest cold symptoms last week but that is improved, and he now has no shortness of breath, cough, hypoxia to indicate acute respiratory illness and no abdominal tenderness to palpation though with the finding on the x-ray as well as his SIRS positive with a leukocytosis we will obtain a CT abdomen pelvis for sepsis workup.  UA is also negative for UTI.  He has no significant electrolyte derangements or anemia.  CK is negative for  rhabdomyolysis.  Patient will likely require admission given SIRS positive for blood culture rule out as well as generalized weakness of severe he laid on the ground at home and could not get up.  Patient does live at home alone.  Daughter was concerned about recent hallucinations especially occur at night but patient is A and O x 4 at this time.  No concern for active psychosis, this could be attributed to dementia or delirium, sundowning possibly.  While in the ER he is noted to have urinary retention on my read of his CT scan and could not urinate except for dribbling in triage, which was enough for  the urine sample but could not empty more than that.  Will insert Foley catheter.   Clinical Course as of 01/29/24 0934  Wed Jan 26, 2024  2233 W/ tachycardia, tachypnea, and leukocytosis, will call code sepsis. [HN]  2234 DG Chest Portable 1 View 1. No acute process. 2. Emphysema 3. hiatal hernia.   [HN]  2234 DG Pelvis Portable 1. No displaced fracture identified. 2. Mild subcutaneous gas in the left inguinal region overlying the inferior pubic ramus.   [HN]  2234 Urinalysis, Routine w reflex microscopic -Urine, Clean Catch Negative for UTI [HN]  2332 Troponin I (High Sensitivity)(!): 55 Mildly eleavted, awaiting repeat [HN]  2332 CT Thoracic Spine Wo Contrast 1. No acute abnormality of the thoracic spine. [HN]  2332 CT Lumbar Spine Wo Contrast 1. No evidence of acute traumatic injury. 2. 3.2 cm fusiform infrarenal abdominal aortic aneurysm. Recommend surveillance ultrasound in 3 years.   [HN]  2333 CT Cervical Spine Wo Contrast 1. No acute abnormality of the cervical spine [HN]  2333 CT Head Wo Contrast 1. No acute intracranial abnormality. [HN]  2333 DG Pelvis Portable 1. No displaced fracture identified. 2. Mild subcutaneous gas in the left inguinal region overlying the inferior pubic ramus.   [HN]    Clinical Course User Index [HN] Franklyn Sid SAILOR, MD    Labs: I  Ordered, and personally interpreted labs.  The pertinent results include:  those listed above  Imaging Studies ordered: I ordered imaging studies including CTH, CT spine I independently visualized and interpreted imaging. I agree with the radiologist interpretation  Additional history obtained from chart review, daughter and pastor at bedside.    Cardiac Monitoring: The patient was maintained on a cardiac monitor.  I personally viewed and interpreted the cardiac monitored which showed an underlying rhythm of: sinus tachycardia  Reevaluation: After the interventions noted above, I reevaluated the patient and found that they have :improved  Social Determinants of Health: Lives independently  Disposition:  Patient is signed out to the oncoming ED physician Dr. Geroldine who is made aware of his history, presentation, exam, workup, and plan.    Co morbidities that complicate the patient evaluation  Past Medical History:  Diagnosis Date   COPD (chronic obstructive pulmonary disease) (HCC)    Hypertension    PTSD (post-traumatic stress disorder) 05/04/1976     Medicines Meds ordered this encounter  Medications   polymixin-bacitracin  (POLYSPORIN ) ointment   lactated ringers  bolus 1,000 mL    Reason 30 mL/kg dose is not being ordered:   First Lactic Acid Pending   ceFEPIme  (MAXIPIME ) 2 g in sodium chloride  0.9 % 100 mL IVPB    Antibiotic Indication::   Other Indication (list below)    Other Indication::   Unknown Source.   DISCONTD: metroNIDAZOLE  (FLAGYL ) IVPB 500 mg    Antibiotic Indication::   Other Indication (list below)    Other Indication::   Unknown Source.   iohexol  (OMNIPAQUE ) 300 MG/ML solution 100 mL   enoxaparin  (LOVENOX ) injection 40 mg   albuterol  (PROVENTIL ) (2.5 MG/3ML) 0.083% nebulizer solution 2.5 mg   acetaminophen  (TYLENOL ) tablet 1,000 mg   melatonin tablet 6 mg   ondansetron  (ZOFRAN ) injection 4 mg   polyethylene glycol (MIRALAX  / GLYCOLAX ) packet 17 g    atorvastatin  (LIPITOR) tablet 20 mg   prazosin  (MINIPRESS ) capsule 2 mg   DISCONTD: buPROPion  (WELLBUTRIN  SR) 12 hr tablet 150 mg   hydrOXYzine  (ATARAX ) tablet 10 mg   AND Linked Order Group  arformoterol  (BROVANA ) nebulizer solution 15 mcg    umeclidinium bromide  (INCRUSE ELLIPTA ) 62.5 MCG/ACT 1 puff   budesonide  (PULMICORT ) nebulizer solution 0.25 mg   DISCONTD: risperiDONE  (RISPERDAL ) tablet 0.75 mg   prazosin  (MINIPRESS ) capsule 2 mg   buPROPion  (WELLBUTRIN  XL) 24 hr tablet 300 mg   Chlorhexidine  Gluconate Cloth 2 % PADS 6 each   carvedilol  (COREG ) tablet 12.5 mg   hydrALAZINE  (APRESOLINE ) injection 10 mg   risperiDONE  (RISPERDAL ) tablet 1 mg    I have reviewed the patients home medicines and have made adjustments as needed  Problem List / ED Course: Problem List Items Addressed This Visit   None Visit Diagnoses       Fall, initial encounter    -  Primary     Closed head injury, initial encounter         Urinary retention                       This note was created using dictation software, which may contain spelling or grammatical errors.    Franklyn Sid SAILOR, MD 01/29/24 [phone]

## 2024-01-26 NOTE — ED Notes (Signed)
 Patient transported to CT

## 2024-01-26 NOTE — ED Notes (Signed)
 Pt taken to CT.

## 2024-01-26 NOTE — ED Notes (Addendum)
 Skin tear noted to left post fa, right anterior wrist and right thumb.  bruising noted to sacral area and right lower posterior/lateral back. And skin tear to right fa. Pupils perrla. Pt moving all extremities.

## 2024-01-26 NOTE — ED Notes (Addendum)
 All skin tear areas cleaned, bacitracin  applied and wrapped with telfa and kling. Nad. Family at bedside. Nad. fell between 1130-1pm today and found around 6pm

## 2024-01-27 DIAGNOSIS — R5381 Other malaise: Secondary | ICD-10-CM | POA: Diagnosis not present

## 2024-01-27 DIAGNOSIS — R7989 Other specified abnormal findings of blood chemistry: Secondary | ICD-10-CM | POA: Insufficient documentation

## 2024-01-27 DIAGNOSIS — W1830XA Fall on same level, unspecified, initial encounter: Secondary | ICD-10-CM

## 2024-01-27 DIAGNOSIS — R251 Tremor, unspecified: Secondary | ICD-10-CM | POA: Insufficient documentation

## 2024-01-27 DIAGNOSIS — E860 Dehydration: Secondary | ICD-10-CM | POA: Diagnosis not present

## 2024-01-27 LAB — TSH: TSH: 2.199 u[IU]/mL (ref 0.350–4.500)

## 2024-01-27 LAB — CBC
HCT: 46.6 % (ref 39.0–52.0)
Hemoglobin: 15.7 g/dL (ref 13.0–17.0)
MCH: 30.6 pg (ref 26.0–34.0)
MCHC: 33.7 g/dL (ref 30.0–36.0)
MCV: 90.8 fL (ref 80.0–100.0)
Platelets: 242 K/uL (ref 150–400)
RBC: 5.13 MIL/uL (ref 4.22–5.81)
RDW: 13.7 % (ref 11.5–15.5)
WBC: 12.5 K/uL — ABNORMAL HIGH (ref 4.0–10.5)
nRBC: 0 % (ref 0.0–0.2)

## 2024-01-27 LAB — RESPIRATORY PANEL BY PCR

## 2024-01-27 LAB — BLOOD GAS, VENOUS
Acid-Base Excess: 4.6 mmol/L — ABNORMAL HIGH (ref 0.0–2.0)
Bicarbonate: 30.4 mmol/L — ABNORMAL HIGH (ref 20.0–28.0)
Drawn by: 53361
O2 Saturation: 47.8 %
Patient temperature: 36.9
pCO2, Ven: 48 mmHg (ref 44–60)
pH, Ven: 7.41 (ref 7.25–7.43)
pO2, Ven: <31 mmHg — CL (ref 32–45)

## 2024-01-27 LAB — PHOSPHORUS: Phosphorus: 2.8 mg/dL (ref 2.5–4.6)

## 2024-01-27 LAB — BASIC METABOLIC PANEL WITH GFR
Anion gap: 12 (ref 5–15)
BUN: 12 mg/dL (ref 8–23)
CO2: 24 mmol/L (ref 22–32)
Calcium: 9.2 mg/dL (ref 8.9–10.3)
Chloride: 106 mmol/L (ref 98–111)
Creatinine, Ser: 0.95 mg/dL (ref 0.61–1.24)
GFR, Estimated: >60 mL/min (ref >60)
Glucose, Bld: 114 mg/dL — ABNORMAL HIGH (ref 70–99)
Potassium: 3.9 mmol/L (ref 3.5–5.1)
Sodium: 142 mmol/L (ref 135–145)

## 2024-01-27 LAB — RESP PANEL BY RT-PCR (RSV, FLU A&B, COVID)  RVPGX2
Influenza A by PCR: NEGATIVE
Influenza B by PCR: NEGATIVE
Resp Syncytial Virus by PCR: NEGATIVE
SARS Coronavirus 2 by RT PCR: NEGATIVE

## 2024-01-27 LAB — MAGNESIUM: Magnesium: 2.3 mg/dL (ref 1.7–2.4)

## 2024-01-27 LAB — AMMONIA: Ammonia: <13 umol/L (ref 9–35)

## 2024-01-27 MED ORDER — ARFORMOTEROL TARTRATE 15 MCG/2ML IN NEBU
15.0000 ug | INHALATION_SOLUTION | Freq: Two times a day (BID) | RESPIRATORY_TRACT | Status: DC
  Administered 2024-01-27 – 2024-01-31 (×8): 15 ug via RESPIRATORY_TRACT
  Filled 2024-01-27 (×8): qty 2

## 2024-01-27 MED ORDER — MELATONIN 3 MG PO TABS
6.0000 mg | ORAL_TABLET | Freq: Every evening | ORAL | Status: DC | PRN
  Administered 2024-01-29 – 2024-01-30 (×2): 6 mg via ORAL
  Filled 2024-01-27 (×2): qty 2

## 2024-01-27 MED ORDER — CHLORHEXIDINE GLUCONATE CLOTH 2 % EX PADS
6.0000 | MEDICATED_PAD | Freq: Every day | CUTANEOUS | Status: DC
  Administered 2024-01-27 – 2024-01-30 (×4): 6 via TOPICAL

## 2024-01-27 MED ORDER — BUPROPION HCL ER (SR) 150 MG PO TB12: 150.0000 mg | ORAL_TABLET | Freq: Two times a day (BID) | ORAL | Status: DC

## 2024-01-27 MED ORDER — ALBUTEROL SULFATE (2.5 MG/3ML) 0.083% IN NEBU: 2.5000 mg | INHALATION_SOLUTION | RESPIRATORY_TRACT | Status: DC | PRN

## 2024-01-27 MED ORDER — BUPROPION HCL ER (XL) 150 MG PO TB24
300.0000 mg | ORAL_TABLET | Freq: Every day | ORAL | Status: DC
  Administered 2024-01-27 – 2024-01-31 (×5): 300 mg via ORAL
  Filled 2024-01-27 (×5): qty 2

## 2024-01-27 MED ORDER — ACETAMINOPHEN 500 MG PO TABS
1000.0000 mg | ORAL_TABLET | Freq: Four times a day (QID) | ORAL | Status: DC | PRN
  Administered 2024-01-27 – 2024-01-28 (×2): 1000 mg via ORAL
  Filled 2024-01-27 (×2): qty 2

## 2024-01-27 MED ORDER — HYDROXYZINE HCL 10 MG PO TABS
10.0000 mg | ORAL_TABLET | Freq: Three times a day (TID) | ORAL | Status: DC | PRN
  Filled 2024-01-27: qty 1

## 2024-01-27 MED ORDER — ATORVASTATIN CALCIUM 10 MG PO TABS
20.0000 mg | ORAL_TABLET | Freq: Every day | ORAL | Status: DC
  Administered 2024-01-27 – 2024-01-31 (×5): 20 mg via ORAL
  Filled 2024-01-27 (×5): qty 2

## 2024-01-27 MED ORDER — RISPERIDONE 0.25 MG PO TABS
0.7500 mg | ORAL_TABLET | Freq: Every day | ORAL | Status: DC
  Administered 2024-01-27: 0.75 mg via ORAL
  Filled 2024-01-27 (×2): qty 3

## 2024-01-27 MED ORDER — ONDANSETRON HCL 4 MG/2ML IJ SOLN
4.0000 mg | Freq: Four times a day (QID) | INTRAMUSCULAR | Status: DC | PRN
  Administered 2024-01-29: 4 mg via INTRAVENOUS
  Filled 2024-01-27: qty 2

## 2024-01-27 MED ORDER — PRAZOSIN HCL 1 MG PO CAPS
2.0000 mg | ORAL_CAPSULE | Freq: Every day | ORAL | Status: DC
  Administered 2024-01-27 – 2024-01-30 (×4): 2 mg via ORAL
  Filled 2024-01-27: qty 2
  Filled 2024-01-27: qty 1
  Filled 2024-01-27: qty 2
  Filled 2024-01-27 (×2): qty 1

## 2024-01-27 MED ORDER — UMECLIDINIUM BROMIDE 62.5 MCG/ACT IN AEPB
1.0000 | INHALATION_SPRAY | Freq: Every day | RESPIRATORY_TRACT | Status: DC
  Administered 2024-01-28 – 2024-01-31 (×4): 1 via RESPIRATORY_TRACT
  Filled 2024-01-27: qty 7

## 2024-01-27 MED ORDER — PRAZOSIN HCL 1 MG PO CAPS
2.0000 mg | ORAL_CAPSULE | ORAL | Status: AC
  Administered 2024-01-27: 2 mg via ORAL
  Filled 2024-01-27 (×2): qty 2

## 2024-01-27 MED ORDER — POLYETHYLENE GLYCOL 3350 17 G PO PACK: 17.0000 g | PACK | Freq: Every day | ORAL | Status: DC | PRN

## 2024-01-27 MED ORDER — BUDESONIDE 0.25 MG/2ML IN SUSP
0.2500 mg | Freq: Two times a day (BID) | RESPIRATORY_TRACT | Status: DC
  Administered 2024-01-27 – 2024-01-31 (×8): 0.25 mg via RESPIRATORY_TRACT
  Filled 2024-01-27 (×8): qty 2

## 2024-01-27 MED ORDER — ENOXAPARIN SODIUM 40 MG/0.4ML IJ SOSY
40.0000 mg | PREFILLED_SYRINGE | INTRAMUSCULAR | Status: DC
  Administered 2024-01-28 – 2024-01-31 (×4): 40 mg via SUBCUTANEOUS
  Filled 2024-01-27 (×4): qty 0.4

## 2024-01-27 NOTE — Plan of Care (Signed)
  Problem: Acute Rehab OT Goals (only OT should resolve) Goal: Pt. Will Perform Grooming Flowsheets (Taken 01/27/2024 1141) Pt Will Perform Grooming:  with contact guard assist  standing Goal: Pt. Will Perform Lower Body Bathing Flowsheets (Taken 01/27/2024 1141) Pt Will Perform Lower Body Bathing:  with modified independence  sitting/lateral leans Goal: Pt. Will Perform Lower Body Dressing Flowsheets (Taken 01/27/2024 1141) Pt Will Perform Lower Body Dressing:  with modified independence  sitting/lateral leans Goal: Pt. Will Transfer To Toilet Flowsheets (Taken 01/27/2024 1141) Pt Will Transfer to Toilet:  with modified independence  ambulating Goal: Pt. Will Perform Toileting-Clothing Manipulation Flowsheets (Taken 01/27/2024 1141) Pt Will Perform Toileting - Clothing Manipulation and hygiene: with modified independence Goal: Pt/Caregiver Will Perform Home Exercise Program Flowsheets (Taken 01/27/2024 1141) Pt/caregiver will Perform Home Exercise Program:  Increased strength  Increased ROM  Both right and left upper extremity  Independently  Tymel Conely OT, MOT

## 2024-01-27 NOTE — Plan of Care (Signed)

## 2024-01-27 NOTE — [place] FL2 (Signed)
 [place]  MEDICAID FL2 LEVEL OF CARE FORM     IDENTIFICATION  Patient Name: Chad Hensley Birthdate: [birth year] Sex: male Admission Date (Current Location): 01/26/2024  [facility] and [place] Number:  [employer] and Address:  [facility],  [address], [place] [address]      Provider Number: [id number]  Attending Physician Name and Address:  Vicci Afton CROME, MD  Relative Name and Phone Number:  Gruver,Renae (Daughter)  [phone]    Current Level of Care: Hospital Recommended Level of Care: Skilled Nursing Facility Prior Approval Number:    Date Approved/Denied:   PASRR Number: [id number]  Discharge Plan: SNF    Current Diagnoses: Patient Active Problem List   Diagnosis Date Noted   Physical deconditioning 01/27/2024   Ground-level fall 01/27/2024   Tremor 01/27/2024   Elevated troponin 01/27/2024   Acute hypoxic respiratory failure (HCC) 08/19/2022   Asthma exacerbation 08/19/2022   Hypertensive urgency    Gastroesophageal reflux disease    Migraine    Slurred speech 02/15/2022   PTSD (post-traumatic stress disorder) 02/15/2022   Psoriasis 02/15/2022   Asthmatic bronchitis , 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   Essential hypertension 01/13/2022    Orientation RESPIRATION BLADDER Height & Weight     Self, Time, Situation, Place  O2 (2L) Incontinent, External catheter Weight:   Height:  5' 7 (170.2 cm)  BEHAVIORAL SYMPTOMS/MOOD NEUROLOGICAL BOWEL NUTRITION STATUS      Continent Diet (Regular)  AMBULATORY STATUS COMMUNICATION OF NEEDS Skin   Extensive Assist Verbally Normal                       Personal Care Assistance Level of Assistance  Bathing, Dressing, Feeding Bathing Assistance: Maximum assistance Feeding assistance: Independent Dressing Assistance: Maximum assistance     Functional Limitations Info  Sight, Hearing, Speech Sight Info: Impaired (Eyeglasses) Hearing Info: Impaired (Tinitis) Speech Info: Adequate     SPECIAL CARE FACTORS FREQUENCY  PT (By licensed PT), OT (By licensed OT)     PT Frequency: 5x/wk OT Frequency: 5x/wk            Contractures Contractures Info: Not present    Additional Factors Info  Code Status, Allergies, Psychotropic Code Status Info: FULL Allergies Info: Antihistamines, Diphenhydramine-type, Codeine Psychotropic Info: See MAR         Current Medications (01/27/2024):  This is the current hospital active medication list Current Facility-Administered Medications  Medication Dose Route Frequency Provider Last Rate Last Admin   acetaminophen  (TYLENOL ) tablet 1,000 mg  1,000 mg Oral Q6H PRN Keturah Carrier, MD   1,000 mg at 01/27/24 0955   albuterol  (PROVENTIL ) (2.5 MG/3ML) 0.083% nebulizer solution 2.5 mg  2.5 mg Nebulization Q4H PRN Segars, Jonathan, MD       arformoterol  (BROVANA ) nebulizer solution 15 mcg  15 mcg Nebulization BID Segars, Jonathan, MD       And   umeclidinium bromide  (INCRUSE ELLIPTA ) 62.5 MCG/ACT 1 puff  1 puff Inhalation Daily Segars, Carrier, MD       atorvastatin  (LIPITOR) tablet 20 mg  20 mg Oral Daily Segars, Jonathan, MD       buPROPion  (WELLBUTRIN  XL) 24 hr tablet 300 mg  300 mg Oral Daily Segars, Jonathan, MD       Chlorhexidine  Gluconate Cloth 2 % PADS 6 each  6 each Topical Q0600 Johnson, Clanford L, MD       enoxaparin  (LOVENOX ) injection 40 mg  40 mg Subcutaneous Q24H Keturah Carrier, MD  fluticasone (FLOVENT HFA) 110 MCG/ACT inhaler 1 puff  1 puff Inhalation BID Segars, Dorn, MD       hydrOXYzine  (ATARAX ) tablet 10 mg  10 mg Oral TID PRN Segars, Jonathan, MD       melatonin tablet 6 mg  6 mg Oral QHS PRN Segars, Dorn, MD       ondansetron  (ZOFRAN ) injection 4 mg  4 mg Intravenous Q6H PRN Segars, Dorn, MD       polyethylene glycol (MIRALAX  / GLYCOLAX ) packet 17 g  17 g Oral Daily PRN Segars, Dorn, MD       prazosin  (MINIPRESS ) capsule 2 mg  2 mg Oral QHS Segars, Dorn, MD       risperiDONE   (RISPERDAL ) tablet 0.75 mg  0.75 mg Oral QHS Segars, Jonathan, MD         Discharge Medications: Please see discharge summary for a list of discharge medications.  Relevant Imaging Results:  Relevant Lab Results:   Additional Information SSN: [id number]  Hoy DELENA Bigness, LCSW

## 2024-01-27 NOTE — ED Provider Notes (Signed)
  Physical Exam  BP (!) 168/88   Pulse 85   Temp 98.1 F (36.7 C) (Oral)   Resp 18   SpO2 94%   Physical Exam Vitals and nursing note reviewed.  Constitutional:      Appearance: Normal appearance.  HENT:     Head: Normocephalic.  Cardiovascular:     Rate and Rhythm: Normal rate and regular rhythm.  Pulmonary:     Effort: Pulmonary effort is normal.  Abdominal:     General: Abdomen is flat. There is no distension.     Tenderness: There is no abdominal tenderness.  Musculoskeletal:     Cervical back: Normal range of motion and neck supple.  Skin:    General: Skin is warm and dry.  Neurological:     Mental Status: He is alert and oriented to person, place, and time.     Procedures  Procedures  ED Course / MDM   Clinical Course as of 01/27/24 0414  Wed Jan 26, 2024  2233 W/ tachycardia, tachypnea, and leukocytosis, will call code sepsis. [HN]  2234 DG Chest Portable 1 View 1. No acute process. 2. Emphysema 3. hiatal hernia.   [HN]  2234 DG Pelvis Portable 1. No displaced fracture identified. 2. Mild subcutaneous gas in the left inguinal region overlying the inferior pubic ramus.   [HN]  2234 Urinalysis, Routine w reflex microscopic -Urine, Clean Catch Negative for UTI [HN]  2332 Troponin I (High Sensitivity)(!): 55 Mildly eleavted, awaiting repeat [HN]  2332 CT Thoracic Spine Wo Contrast 1. No acute abnormality of the thoracic spine. [HN]  2332 CT Lumbar Spine Wo Contrast 1. No evidence of acute traumatic injury. 2. 3.2 cm fusiform infrarenal abdominal aortic aneurysm. Recommend surveillance ultrasound in 3 years.   [HN]  2333 CT Cervical Spine Wo Contrast 1. No acute abnormality of the cervical spine [HN]  2333 CT Head Wo Contrast 1. No acute intracranial abnormality. [HN]  2333 DG Pelvis Portable 1. No displaced fracture identified. 2. Mild subcutaneous gas in the left inguinal region overlying the inferior pubic ramus.   [HN]    Clinical Course  User Index [HN] Chad Sid SAILOR, MD   Medical Decision Making Amount and/or Complexity of Data Reviewed Labs: ordered. Decision-making details documented in ED Course. Radiology: ordered. Decision-making details documented in ED Course.  Risk OTC drugs. Prescription drug management. Decision regarding hospitalization.   Care assumed from Dr. Franklyn at shift change.  Patient awaiting results of urinalysis.  Patient apparently became confused this evening, then fell.  He is complaining of pain in various areas which were previously imaged and were unremarkable.    His CT scan did show a distended bladder and 1500 cc of urine were obtained with catheterization.  Cause of his retention is undetermined, but Foley catheter will remain in place.  Previous provider concerned about possible sepsis/SIRS, so has received IV antibiotics and cultures obtained.  Patient will be admitted to the hospitalist service for overnight observation.   Geroldine Berg, MD 01/27/24 [phone]

## 2024-01-27 NOTE — Evaluation (Signed)
 Physical Therapy Evaluation Patient Details Name: Chad Hensley MRN: [id number] DOB: 03-24-1951 Today's Date: 01/27/2024  History of Present Illness  Chad Hensley is a 73 y.o. 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 Reports that he was walking around his couch and tripped and fell, bumping things on the way down. And was unable to stand, on the ground for about 5 hours. Reports that he has had frequent falls, about 2 per month over the last 6 months. Reports mechanical falls. Sometimes loses his balance or will get dizzy and falls. No syncopal episode, no chest pain. He reports having uncontrolled hypertension at home, denies any low BP. Sometimes attributes dizziness to severe hypertension SBP ~ 190s. Also notes that has developed new tremor over the past few weeks which has been impacting his functional status. Since the fall feels generally sore, associated skin tears.      Otherwise reports recently having recent cough over the past 4 weeks which has been nonproductive, no other URI symptoms. Gradually improving. Feels dehydrated currently. Denies fever, chills, N/V/D.      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 Patient is aware these hallucinations are not real and reports improved since medication was adjusted.   Clinical Impression  Patient demonstrates slow labored movement for sitting up at bedside, has difficulty scooting to EOB, very unsteady on feet and limited to a few steps forward/backwards at bedside before having to sit due to c/o fatigue and generalized weakness. Patient tolerated sitting up in chair after therapy. Patient will benefit from continued skilled physical therapy in hospital and recommended venue below to increase strength, balance, endurance for safe ADLs and gait.           If plan is  discharge home, recommend the following: A lot of help with bathing/dressing/bathroom;A lot of help with walking and/or transfers;Help with stairs or ramp for entrance;Assistance with cooking/housework;Assist for transportation   Can travel by private vehicle   No    Equipment Recommendations None recommended by PT  Recommendations for Other Services       Functional Status Assessment Patient has had a recent decline in their functional status and demonstrates the ability to make significant improvements in function in a reasonable and predictable amount of time.     Precautions / Restrictions Precautions Precautions: Fall Recall of Precautions/Restrictions: Intact Restrictions Weight Bearing Restrictions Per Provider Order: No      Mobility  Bed Mobility Overal bed mobility: Needs Assistance Bed Mobility: Supine to Sit     Supine to sit: Contact guard     General bed mobility comments: increased time, labored movement    Transfers Overall transfer level: Needs assistance Equipment used: Rolling walker (2 wheels) Transfers: Sit to/from Stand, Bed to chair/wheelchair/BSC Sit to Stand: Mod assist   Step pivot transfers: Mod assist       General transfer comment: slow labored movement with buckling of knees due to weakness    Ambulation/Gait Ambulation/Gait assistance: Mod assist Gait Distance (Feet): 8 Feet Assistive device: Rolling walker (2 wheels) Gait Pattern/deviations: Decreased step length - right, Decreased step length - left, Decreased stride length, Trunk flexed, Knees buckling Gait velocity: slow     General Gait Details: limited to a few side steps and steps forward/backwards before having to sit due to BLE weakness with buckling of knees and  c/o fatigue  Stairs            Wheelchair Mobility     Tilt Bed    Modified Rankin (Stroke Patients Only)       Balance Overall balance assessment: Needs assistance Sitting-balance support:  Feet supported, No upper extremity supported Sitting balance-Leahy Scale: Fair Sitting balance - Comments: seated at EOB   Standing balance support: Bilateral upper extremity supported, During functional activity, Reliant on assistive device for balance Standing balance-Leahy Scale: Poor Standing balance comment: fair/poor using RW                             Pertinent Vitals/Pain Pain Assessment Pain Assessment: Faces Faces Pain Scale: Hurts a little bit Pain Location: general pain with movement Pain Descriptors / Indicators: Discomfort Pain Intervention(s): Monitored during session    Home Living Family/patient expects to be discharged to:: Private residence Living Arrangements: Alone Available Help at Discharge: Family;Available PRN/intermittently Type of Home: House Home Access: Ramped entrance     Alternate Level Stairs-Number of Steps: 12 Home Layout: Laundry or work area in basement;Two level;Able to live on main level with bedroom/bathroom Home Equipment: [place] - single point;Shower seat;Grab bars - tub/shower;Rollator (4 wheels);Wheelchair - manual;BSC/3in1 Additional Comments: Daughter stays with him overnight sometimes. Pt reports his daughter can be with him 24/7. Info per previous admission and pt report.    Prior Function Prior Level of Function : Independent/Modified Independent;Driving             Mobility Comments: Mostly ambulates inside with SPC. ADLs Comments: Independent ADL and IADL     Extremity/Trunk Assessment   Upper Extremity Assessment Upper Extremity Assessment: Defer to OT evaluation    Lower Extremity Assessment Lower Extremity Assessment: Generalized weakness    Cervical / Trunk Assessment Cervical / Trunk Assessment: Kyphotic  Communication   Communication Communication: No apparent difficulties    Cognition Arousal: Alert Behavior During Therapy: WFL for tasks assessed/performed   PT - Cognitive impairments: No  apparent impairments                         Following commands: Intact       Cueing Cueing Techniques: Verbal cues, Tactile cues     General Comments      Exercises     Assessment/Plan    PT Assessment Patient needs continued PT services  PT Problem List Decreased strength;Decreased activity tolerance;Decreased balance;Decreased mobility       PT Treatment Interventions DME instruction;Gait training;Stair training;Functional mobility training;Therapeutic activities;Therapeutic exercise;Balance training;Patient/family education    PT Goals (Current goals can be found in the Care Plan section)  Acute Rehab PT Goals Patient Stated Goal: return home after rehab PT Goal Formulation: With patient Time For Goal Achievement: 02/10/24 Potential to Achieve Goals: Good    Frequency Min 3X/week     Co-evaluation PT/OT/SLP Co-Evaluation/Treatment: Yes Reason for Co-Treatment: To address functional/ADL transfers PT goals addressed during session: Mobility/safety with mobility;Balance OT goals addressed during session: ADL's and self-care       AM-PAC PT 6 Clicks Mobility  Outcome Measure Help needed turning from your back to your side while in a flat bed without using bedrails?: A Little Help needed moving from lying on your back to sitting on the side of a flat bed without using bedrails?: A Lot Help needed moving to and from a bed to a chair (including a wheelchair)?: A Lot Help  needed standing up from a chair using your arms (e.g., wheelchair or bedside chair)?: A Lot Help needed to walk in hospital room?: A Lot Help needed climbing 3-5 steps with a railing? : Total 6 Click Score: 12    End of Session Equipment Utilized During Treatment: Oxygen Activity Tolerance: Patient tolerated treatment well;Patient limited by fatigue Patient left: in chair;with call bell/phone within reach Nurse Communication: Mobility status PT Visit Diagnosis: Unsteadiness on feet  (R26.81);Other abnormalities of gait and mobility (R26.89);Muscle weakness (generalized) (M62.81)    Time: 9184-9153 PT Time Calculation (min) (ACUTE ONLY): 31 min   Charges:   PT Evaluation $PT Eval Moderate Complexity: 1 Mod PT Treatments $Therapeutic Activity: 23-37 mins PT General Charges $$ ACUTE PT VISIT: 1 Visit         12:05 PM, 01/27/24 Lynwood Music, MPT Physical Therapist with [facility] [phone] office [phone] mobile phone

## 2024-01-27 NOTE — TOC Initial Note (Addendum)
 Transition of Care [facility]) - Initial/Assessment Note    Patient Details  Name: Chad Hensley MRN: [id number] Date of Birth: Apr 25, 1951  [facility] CM/SW Contact:    Chad DELENA Bigness, LCSW Phone Number: 01/27/2024, 11:43 AM  Clinical Narrative:                 Pt from home alone. Met with pt and confirmed plan for STR at discharge. Pt denies having been to SNF in the past. Pt prefers placement at [facility]. Referrals have been faxed out for SNF placement at currently awaiting bed offers.  CSW left VM w/ pt's daughter, Chad Hensley at pt's request to update on current disposition plans.  [facility] notified of pt's admission: [phone]  ADDENDUM: Spoke with pt's daughter and confirmed current disposition plans.   ADDENDUM #2: Chad Hensley is OON w/ pt's insurance. CLC checklist faxed to the [facility] at [phone] for review for SNF placement. Further referrals can be sent out once it is determined if pt is under [facility] or regular CNH through the [facility] as only certain facilities are in-network for each.   Expected Discharge Plan: Skilled Nursing Facility Barriers to Discharge: Continued Medical Work up, SNF Pending bed offer   Patient Goals and CMS Choice Patient states their goals for this hospitalization and ongoing recovery are:: To feel better CMS Medicare.gov Compare Post Acute Care list provided to:: Patient Choice offered to / list presented to : Patient [place] ownership interest in [facility].provided to:: Patient    Expected Discharge Plan and Services In-house Referral: Clinical Social Work Discharge Planning Services: NA Post Acute Care Choice: Skilled Nursing Facility Living arrangements for the past 2 months: Single Family Home                 DME Arranged: N/A DME Agency: NA                  Prior Living Arrangements/Services Living arrangements for the past 2 months: Single Family Home Lives with:: Self Patient language and need for  interpreter reviewed:: Yes Do you feel safe going back to the place where you live?: Yes      Need for Family Participation in Patient Care: No (Comment) Care giver support system in place?: No (comment) Current home services: DME (2L O2 through [employer]) Criminal Activity/Legal Involvement Pertinent to Current Situation/Hospitalization: No - Comment as needed  Activities of Daily Living   ADL Screening (condition at time of admission) Independently performs ADLs?: No Does the patient have a NEW difficulty with bathing/dressing/toileting/self-feeding that is expected to last >3 days?: Yes (Initiates electronic notice to provider for possible OT consult) Does the patient have a NEW difficulty with getting in/out of bed, walking, or climbing stairs that is expected to last >3 days?: Yes (Initiates electronic notice to provider for possible PT consult) Does the patient have a NEW difficulty with communication that is expected to last >3 days?: No Is the patient deaf or have difficulty hearing?: Yes Does the patient have difficulty seeing, even when wearing glasses/contacts?: No Does the patient have difficulty concentrating, remembering, or making decisions?: No  Permission Sought/Granted Permission sought to share information with : Facility Medical sales representative, Family Supports Permission granted to share information with : Yes, Verbal Permission Granted  Share Information with NAME: Chad Hensley (Daughter)  [phone]  Permission granted to share info w AGENCY: SNF's        Emotional Assessment Appearance:: Appears stated age Attitude/Demeanor/Rapport: Engaged Affect (typically  observed): Accepting Orientation: : Oriented to Self, Oriented to Place, Oriented to  Time, Oriented to Situation Alcohol / Substance Use: Not Applicable Psych Involvement: No (comment)  Admission diagnosis:  Urinary retention [R33.9] Physical deconditioning [R53.81] Closed head injury, initial  encounter [S09.90XA] Fall, initial encounter [W19.XXXA] Patient Active Problem List   Diagnosis Date Noted   Physical deconditioning 01/27/2024   Ground-level fall 01/27/2024   Tremor 01/27/2024   Elevated troponin 01/27/2024   Acute hypoxic respiratory failure (HCC) 08/19/2022   Asthma exacerbation 08/19/2022   Hypertensive urgency    Gastroesophageal reflux disease    Migraine    Slurred speech 02/15/2022   PTSD (post-traumatic stress disorder) 02/15/2022   Psoriasis 02/15/2022   Asthmatic bronchitis , 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   Essential hypertension 01/13/2022   PCP:  Clinic, [facility] Pharmacy:   [facility] - [place], [place] - [address] [place] [address] Phone: [phone] Fax: [phone]     Social Drivers of Health (SDOH) Social History: SDOH Screenings   Food Insecurity: No Food Insecurity (01/27/2024)  Housing: Low Risk  (01/27/2024)  Transportation Needs: No Transportation Needs (01/27/2024)  Utilities: Not At Risk (01/27/2024)  Depression (PHQ2-9): High Risk (10/12/2023)  Social Connections: Unknown (01/27/2024)  Tobacco Use: Medium Risk (01/26/2024)   SDOH Interventions:     Readmission Risk Interventions     No data to display

## 2024-01-27 NOTE — H&P (Addendum)
 History and Physical    Chad Hensley FMW:[id number] DOB: May 25, 1950 DOA: 01/26/2024  PCP: [facility]   Patient coming from: Home   Chief Complaint:  Chief Complaint  Patient presents with   Fall    HPI:  Chad Hensley is a 73 y.o. 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 Reports that he was walking around his couch and tripped and fell, bumping things on the way down. And was unable to stand, on the ground for about 5 hours. Reports that he has had frequent falls, about 2 per month over the last 6 months. Reports mechanical falls. Sometimes loses his balance or will get dizzy and falls. No syncopal episode, no chest pain. He reports having uncontrolled hypertension at home, denies any low BP. Sometimes attributes dizziness to severe hypertension SBP ~ 190s. Also notes that has developed new tremor over the past few weeks which has been impacting his functional status. Since the fall feels generally sore, associated skin tears.   Otherwise reports recently having recent cough over the past 4 weeks which has been nonproductive, no other URI symptoms. Gradually improving. Feels dehydrated currently. Denies fever, chills, N/V/D.   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 Patient is aware these hallucinations are not real and reports improved since medication was adjusted.    Review of Systems:  ROS complete and negative except as marked above   Allergies  Allergen Reactions   Antihistamines, Diphenhydramine-Type Palpitations   Codeine Nausea And Vomiting    Prior to Admission medications   Medication Sig Start Date End Date Taking? Authorizing Provider  acetaminophen  (TYLENOL ) 500 MG tablet Take 2 tablets (1,000 mg total) by mouth every 8 (eight) hours as needed for headache. 02/17/22   Ricky Fines, MD  ALBUTEROL  IN Inhale into the lungs as needed.    [provider]  amLODipine (NORVASC) 5 MG tablet Take 5 mg by mouth daily. If BP over 130/85    [provider]  ascorbic acid (VITAMIN C) 500 MG tablet Take 500 mg by mouth daily.    [provider]  aspirin  EC 81 MG tablet Take 1 tablet (81 mg total) by mouth daily. Swallow whole. 02/18/22   Ricky Fines, MD  atorvastatin  (LIPITOR) 20 MG tablet Take 20 mg by mouth daily.    [provider]  buPROPion  (WELLBUTRIN  SR) 150 MG 12 hr tablet Take 150 mg by mouth 2 (two) times daily.    [provider]  carvedilol  (COREG ) 12.5 MG tablet Take 1 tablet (12.5 mg total) by mouth 2 (two) times daily with a meal. 02/17/22   Ricky Fines, MD  cetirizine (ZYRTEC) 10 MG tablet Take 10 mg by mouth daily.    [provider]  Cholecalciferol 2000 UNITS TABS Take by mouth daily.    [provider]  doxycycline  (VIBRA -TABS) 100 MG tablet Take 1 tablet (100 mg total) by mouth every 12 (twelve) hours. 08/22/22   Drusilla Sabas RAMAN, MD  famotidine  (PEPCID ) 10 MG tablet Take 2 tablets (20 mg total) by mouth at bedtime. 02/17/22   Ricky Fines, MD  fluticasone-salmeterol [facility] INHUB) 250-50 MCG/ACT AEPB Inhale 1 puff into the lungs in the morning and at bedtime.    [provider]  furosemide  (LASIX ) 20 MG tablet Take 1 tablet (20 mg total) by mouth daily.  08/22/22 12/20/22  Drusilla Sabas RAMAN, MD  guaiFENesin  (MUCINEX ) 600 MG 12 hr tablet Take 1 tablet (600 mg total) by mouth 2 (two) times daily. 08/22/22   Drusilla Sabas RAMAN, MD  hydrOXYzine  (ATARAX ) 10 MG tablet Take 10 mg by mouth 3 (three) times daily as needed.    [provider]  isosorbide  mononitrate (IMDUR ) 30 MG 24 hr tablet Take 1 tablet (30 mg total) by mouth daily. 02/18/22   Ricky Fines, MD  losartan  (COZAAR ) 50 MG tablet Take 1 tablet (50 mg total) by mouth daily. 06/16/23   Melvenia Motto, MD  Omega-3 Fatty Acids (FISH OIL PO)  Take 1 capsule by mouth 2 (two) times daily.    [provider]  omeprazole (PRILOSEC) 20 MG capsule Take 20 mg by mouth daily.    [provider]  prazosin  (MINIPRESS ) 2 MG capsule Take 2 mg by mouth at bedtime.    [provider]  predniSONE  (DELTASONE ) 10 MG tablet Prednisone  40 mg po daily x 1 day then Prednisone  30 mg po daily x 1 day then Prednisone  20 mg po daily x 1 day then Prednisone  10 mg daily x 1 day then stop... 08/22/22   Drusilla Sabas RAMAN, MD  Tiotropium Bromide-Olodaterol (STIOLTO RESPIMAT) 2.5-2.5 MCG/ACT AERS Inhale into the lungs daily.    [provider]  vitamin B-12 (CYANOCOBALAMIN) 50 MCG tablet Take 50 mcg by mouth daily.    [provider]    Past Medical History:  Diagnosis Date   COPD (chronic obstructive pulmonary disease) (HCC)    Hypertension    PTSD (post-traumatic stress disorder) 05/04/1976    History reviewed. No pertinent surgical history.   reports that he quit smoking about 53 years ago. His smoking use included cigarettes. He started smoking about 54 years ago. He has a 1 pack-year smoking history. He does not have any smokeless tobacco history on file. He reports that he does not drink alcohol and does not use drugs.  Family History  Problem Relation Age of Onset   Hypertension Mother    Hypertension Father    Alpha-1 antitrypsin deficiency Neg Hx    Tuberous sclerosis Neg Hx      Physical Exam: Vitals:   01/27/24 0055 01/27/24 0056 01/27/24 0130 01/27/24 0200  BP:   (!) 167/90 (!) 159/102  Pulse:   88 90  Resp: 20 20 18 15   Temp:      TempSrc:      SpO2:   94% 94%    Gen: Awake, alert, chronically ill appearing.   CV: Regular, normal S1, S2, no murmurs  Resp: Normal WOB, CTAB  Abd: Round, slightly hyperresonant, reducible periumbilical hernia, normoactive, nontender MSK: Symmetric, no edema  Skin: Multiple ecchymosis and skin tears are dressed. No other rashes or lesions to exposed skin   Neuro: Alert and interactive, there is high frequency tremor in upper ext > LE. And appears to have intermittent myoclonus. Strength is globally reduced 4/5, sensation intact and equal.  Psych: euthymic, appropriate    Data review:   Labs reviewed, notable for:   Chemistries unremarkable CK 271 High-sensitivity Trop 55 -> 58 Lactate 1.4 WBC 15 Flu/COVID/RSV negative UA negative   Micro:  Results for orders placed or performed during the hospital encounter of 01/26/24  Resp panel by RT-PCR (RSV, Flu A&B, Covid) Anterior Nasal Swab     Status: None   Collection Time: 01/26/24  9:27 PM   Specimen: Anterior Nasal Swab  Result Value Ref  Range Status   SARS Coronavirus 2 by RT PCR NEGATIVE NEGATIVE Final    Comment: (NOTE) SARS-CoV-2 target nucleic acids are NOT DETECTED.  The SARS-CoV-2 RNA is generally detectable in upper respiratory specimens during the acute phase of infection. The lowest concentration of SARS-CoV-2 viral copies this assay can detect is 138 copies/mL. A negative result does not preclude SARS-Cov-2 infection and should not be used as the sole basis for treatment or other patient management decisions. A negative result may occur with  improper specimen collection/handling, submission of specimen other than nasopharyngeal swab, presence of viral mutation(s) within the areas targeted by this assay, and inadequate number of viral copies(<138 copies/mL). A negative result must be combined with clinical observations, patient history, and epidemiological information. The expected result is Negative.  Fact Sheet for Patients:  BloggerCourse.com  Fact Sheet for Healthcare Providers:  SeriousBroker.it  This test is no t yet approved or cleared by the [place]  FDA and  has been authorized for detection and/or diagnosis of SARS-CoV-2 by FDA under an Emergency Use Authorization (EUA). This EUA will remain  in  effect (meaning this test can be used) for the duration of the COVID-19 declaration under Section 564(b)(1) of the Act, 21 U.S.C.section 360bbb-3(b)(1), unless the authorization is terminated  or revoked sooner.       Influenza A by PCR NEGATIVE NEGATIVE Final   Influenza B by PCR NEGATIVE NEGATIVE Final    Comment: (NOTE) The Xpert Xpress SARS-CoV-2/FLU/RSV plus assay is intended as an aid in the diagnosis of influenza from Nasopharyngeal swab specimens and should not be used as a sole basis for treatment. Nasal washings and aspirates are unacceptable for Xpert Xpress SARS-CoV-2/FLU/RSV testing.  Fact Sheet for Patients: BloggerCourse.com  Fact Sheet for Healthcare Providers: SeriousBroker.it  This test is not yet approved or cleared by the [place]  FDA and has been authorized for detection and/or diagnosis of SARS-CoV-2 by FDA under an Emergency Use Authorization (EUA). This EUA will remain in effect (meaning this test can be used) for the duration of the COVID-19 declaration under Section 564(b)(1) of the Act, 21 U.S.C. section 360bbb-3(b)(1), unless the authorization is terminated or revoked.     Resp Syncytial Virus by PCR NEGATIVE NEGATIVE Final    Comment: (NOTE) Fact Sheet for Patients: BloggerCourse.com  Fact Sheet for Healthcare Providers: SeriousBroker.it  This test is not yet approved or cleared by the [place]  FDA and has been authorized for detection and/or diagnosis of SARS-CoV-2 by FDA under an Emergency Use Authorization (EUA). This EUA will remain in effect (meaning this test can be used) for the duration of the COVID-19 declaration under Section 564(b)(1) of the Act, 21 U.S.C. section 360bbb-3(b)(1), unless the authorization is terminated or revoked.  Performed at [facility], [address] [place], [place] [address]     Imaging reviewed:   CT ABDOMEN PELVIS W CONTRAST Result Date: 01/27/2024 CLINICAL DATA:  Fall. EXAM: CT ABDOMEN AND PELVIS WITH CONTRAST TECHNIQUE: Multidetector CT imaging of the abdomen and pelvis was performed using the standard protocol following bolus administration of intravenous contrast. RADIATION DOSE REDUCTION: This exam was performed according to the departmental dose-optimization program which includes automated exposure control, adjustment of the mA and/or kV according to patient size and/or use of iterative reconstruction technique. CONTRAST:  OMNIPAQUE  IOHEXOL  300 MG/ML  SOLN COMPARISON:  Pelvis today FINDINGS: Lower chest: No acute findings.  Moderate-sized hiatal hernia. Hepatobiliary: Multiple layering gallstones within the gallbladder. No focal hepatic abnormality or biliary ductal  dilatation. Pancreas: No focal abnormality or ductal dilatation. Spleen: No focal abnormality.  Normal size. Adrenals/Urinary Tract: No adrenal abnormality. No focal renal abnormality. No stones or hydronephrosis. Urinary bladder is unremarkable. Stomach/Bowel: Normal appendix. Sigmoid diverticulosis. There is a moderate-sized left inguinal hernia containing a loop of sigmoid colon which is the cause of the lucency seen in the inguinal region on prior pelvis x-ray. No bowel obstruction. Stomach and small bowel decompressed. Vascular/Lymphatic: Mild infrarenal abdominal aortic aneurysm, 3 cm. Diffuse aortic atherosclerosis. No adenopathy. Reproductive: No visible focal abnormality. Other: No free fluid or free air. Musculoskeletal: No acute bony abnormality. IMPRESSION: The lucency seen within the left inguinal region on prior pelvis study represents a left inguinal hernia containing sigmoid colon. No bowel obstruction. Sigmoid diverticulosis. Cholelithiasis. 3 cm infrarenal abdominal aortic aneurysm. Recommend follow-up ultrasound every 3 years. (Ref.: J Vasc Surg. 2018; 67:2-77 and J Am Coll Radiol 2013;10(10):789-794.)  Electronically Signed   By: Franky Crease M.D.   On: 01/27/2024 00:12   CT Thoracic Spine Wo Contrast Result Date: 01/26/2024 EXAM: CT THORACIC SPINE WITHOUT CONTRAST 01/26/2024 10:53:57 PM TECHNIQUE: CT of the thoracic spine was performed without the administration of intravenous contrast. Multiplanar reformatted images are provided for review. Automated exposure control, iterative reconstruction, and/or weight based adjustment of the mA/kV was utilized to reduce the radiation dose to as low as reasonably achievable. COMPARISON: None available. CLINICAL HISTORY: Back trauma, no prior imaging (Age >= 16y). Pt lives at home and states he tripped and fell. Neighbor came to check on him and didn't come to door, law enforcement went in to find pt on floo. FINDINGS: BONES AND ALIGNMENT: Normal vertebral body heights. No acute fracture or suspicious bone lesion. Normal alignment. DEGENERATIVE CHANGES: Mild degenerative changes of the mid thoracic spine. SOFT TISSUES: Moderate hiatal hernia. Mild thoracic aortic atherosclerosis. Moderate coronary atherosclerosis of the LAD. IMPRESSION: 1. No acute abnormality of the thoracic spine. Electronically signed by: Pinkie Pebbles MD 01/26/2024 11:09 PM EDT RP Workstation: [id number]   CT Lumbar Spine Wo Contrast Result Date: 01/26/2024 EXAM: CT OF THE LUMBAR SPINE WITHOUT CONTRAST 01/26/2024 10:53:57 PM TECHNIQUE: CT of the lumbar spine was performed without the administration of intravenous contrast. Multiplanar reformatted images are provided for review. Automated exposure control, iterative reconstruction, and/or weight based adjustment of the mA/kV was utilized to reduce the radiation dose to as low as reasonably achievable. COMPARISON: None available. CLINICAL HISTORY: Back trauma, no prior imaging (Age >= 16y). Pt lives at home and states he tripped and fell. Neighbor came to check on him and didn't come to door, law enforcement went in to find pt on floo. FINDINGS:  BONES AND ALIGNMENT: Normal vertebral body heights. No acute fracture or suspicious bone lesion. Normal alignment. DEGENERATIVE CHANGES: Mild degenerative changes of the upper / mid lumbar spine. SOFT TISSUES: 3.2 cm fusiform infrarenal abdominal aortic aneurysm (coronal image 16). Atherosclerotic calcification of the abdominal aorta and branch vessels. IMPRESSION: 1. No evidence of acute traumatic injury. 2. 3.2 cm fusiform infrarenal abdominal aortic aneurysm. Recommend surveillance ultrasound in 3 years. Electronically signed by: Pinkie Pebbles MD 01/26/2024 11:06 PM EDT RP Workstation: [id number]   CT Cervical Spine Wo Contrast Result Date: 01/26/2024 EXAM: CT CERVICAL SPINE WITHOUT CONTRAST 01/26/2024 10:53:57 PM TECHNIQUE: CT of the cervical spine was performed without the administration of intravenous contrast. Multiplanar reformatted images are provided for review. Automated exposure control, iterative reconstruction, and/or weight based adjustment of the mA/kV was utilized to reduce the radiation dose to as low  as reasonably achievable. COMPARISON: None available. CLINICAL HISTORY: Neck trauma (Age >= 65y). Pt lives at home and states he tripped and fell. Neighbor came to check on him and didn't come to door, law enforcement went in to find pt on floo. FINDINGS: CERVICAL SPINE: BONES AND ALIGNMENT: No acute fracture or traumatic malalignment. DEGENERATIVE CHANGES: Mild degenerative changes of the mid cervical spine. SOFT TISSUES: No prevertebral soft tissue swelling. IMPRESSION: 1. No acute abnormality of the cervical spine. Electronically signed by: Pinkie Pebbles MD 01/26/2024 11:01 PM EDT RP Workstation: [id number]   CT Head Wo Contrast Result Date: 01/26/2024 EXAM: CT HEAD WITHOUT CONTRAST 01/26/2024 10:53:57 PM TECHNIQUE: CT of the head was performed without the administration of intravenous contrast. Automated exposure control, iterative reconstruction, and/or weight based adjustment of the  mA/kV was utilized to reduce the radiation dose to as low as reasonably achievable. COMPARISON: 02/16/2022 CLINICAL HISTORY: Head trauma, minor (Age >= 65y). Pt lives at home and states he tripped and fell. Neighbor came to check on him and didn't come to door, law enforcement went in to find pt on floo. FINDINGS: BRAIN AND VENTRICLES: No acute hemorrhage. No evidence of acute infarct. No hydrocephalus. No extra-axial collection. No mass effect or midline shift. Mild intracranial atherosclerosis. Mild global cortical atrophy. Subcortical and periventricular small vessel ischemic changes. ORBITS: No acute abnormality. SINUSES: No acute abnormality. SOFT TISSUES AND SKULL: No acute soft tissue abnormality. No skull fracture. IMPRESSION: 1. No acute intracranial abnormality. Electronically signed by: Pinkie Pebbles MD 01/26/2024 11:00 PM EDT RP Workstation: [id number]   DG Pelvis Portable Result Date: 01/26/2024 EXAM: 1 or 2 VIEW(S) XRAY OF THE PELVIS 01/26/2024 10:13:00 PM COMPARISON: None available. CLINICAL HISTORY: fall. Elevated BP @ time of imaging (176/104), fell earlier this evening FINDINGS: BONES AND JOINTS: No displaced fracture identified. SOFT TISSUES: Mild subcutaneous gas within the left inguinal region overlying the inferior pubic ramus. IMPRESSION: 1. No displaced fracture identified. 2. Mild subcutaneous gas in the left inguinal region overlying the inferior pubic ramus. Electronically signed by: Dorethia Molt MD 01/26/2024 10:19 PM EDT RP Workstation: [id number]   DG Chest Portable 1 View Result Date: 01/26/2024 EXAM: 1 VIEW XRAY OF THE CHEST 01/26/2024 10:11:00 PM COMPARISON: 06/16/2023 CLINICAL HISTORY: fall. Elevated BP @ time of imaging (176/104), fell earlier this evening FINDINGS: LUNGS AND PLEURA: Emphysema. No focal pulmonary opacity. No pulmonary edema. No pleural effusion. No pneumothorax. HEART AND MEDIASTINUM: Small Hiatal hernia. No acute abnormality of the cardiac and  mediastinal silhouettes. BONES AND SOFT TISSUES: No acute osseous abnormality. IMPRESSION: 1. No acute process. 2. Emphysema 3. hiatal hernia. Electronically signed by: Dorethia Molt MD 01/26/2024 10:16 PM EDT RP Workstation: [id number]    EKG:  Personally reviewed, sinus tachycardia 103, no acute ischemic changes  ED Course:  Treated with 1 L LR, cefepime , Flagyl    Assessment/Plan:  73 y.o. 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   Ground level fall  Deconditioning Hx chronic frequent fall ~ 12 in 6 mo. Reported mechanical fall PTA, on ground x 5 hr unable to stand. Note tremor and occasional myoclonus, question if may be affecting mobility. Labs mostly unremarkable, WBC 16 per below. CK wnl. Imaging including chest x-ray, pelvic x-ray, CT head, C-spine, abdomen pelvis, T/L-spine without acute traumatic injuries.  -Check orthostatics, IV fluid per below -PT/OT evaluation  SIRS, suspect hypovolemia mainly  Leukocytosis, unclear source  Hx of cough x 4 weeks, without other localizing infectious symptoms., feels dehydrated reports decreased PO. On initial evaluation tachycardic in the low 100s, WBC 16.  No other SIRS criteria.  Heart rate improved with hydration alone.  Suspect hypovolemia mainly.  Unclear source of his leukocytosis, possibly LRI. Imaging unrevealing.  - S/p 1 L IV fluid, continue LR hydration as able. - S/p cefepime  and Flagyl  in the ED.  No source of infection identified, okay to stop antibiotics for now. - Can follow blood cultures, check RVP considering his recent URI symptoms  Acute myocardial injury  HS troponin is flat 55 -> 58. EKG with no ischemic changes. No hx of chest pain. Suspect demand from hypovolemia.  -- Management directed at above.   Action tremor,  Myoclonus, likely secondary   Reporting new onset over past few weeks. On exam with high frequency tremor brought on by activity in UE > LE, and occasional myoclonus. Etiology unlcear at this time, possible medication related, metabolic, underlying neurocognitive/neurodegenerative disease  -- Would consider neurology consult in AM.  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  Diastolic heart failure: Last echo 02/2022 LVEF 60 to 65%.  Without acute exacerbation, suspect dry per above. Hold home lasix  (home 20 mg)  COPD/asthma, CHRF on 2 L O2: Continue on home O2. Continue home lama/laba/ics  Hypertension: Unlcear if still on Coreg , or Losartan -hydrochlorothiazide  will need to verify, held for now.  HLD: Continue home atorvastatin  20 mg  Mood d/o, PTSD: Continue home Risperidone  0.75 mg nightly (will need to verify dose, may have been adjusted recently), Bupropion  300 mg daily, Prazosin  2 mg at bedtime, hydroxyzine  prn.    There is no height or weight on file to calculate BMI.    DVT prophylaxis:  Lovenox  Code Status:  Full Code Diet:  Diet Orders (From admission, onward)     Start     Ordered   01/26/24 2234  Diet NPO time specified  (Septic presentation on arrival (screening labs, nursing and treatment orders for obvious sepsis))  Diet effective now        01/26/24 2234           Family Communication:  None   Consults:  None   Admission status:   Observation, Med-Surg  Severity of Illness: The appropriate patient status for this  patient is OBSERVATION. Observation status is judged to be reasonable and necessary in order to provide the required intensity of service to ensure the patient's safety. The patient's presenting symptoms, physical exam findings, and initial radiographic and laboratory data in the context of their medical condition is felt to place them at decreased risk for further clinical deterioration. Furthermore, it is anticipated that the patient will be medically stable for discharge from the hospital within 2 midnights of admission.    Dorn Dawson, MD [facility]  How to contact the [facility] Attending or Consulting provider 7A - 7P or covering provider during after hours 7P -7A, for this patient.  Check the care team in [facility] and look for a) attending/consulting [facility] provider listed and b) the  [facility] team listed Log into [email or web address] and use [facility]'s universal password to access. If you do not have the password, please contact the hospital operator. Locate the [facility] provider you are looking for under [facility] and page to a number that you can be directly reached. If you still have difficulty reaching the provider, please page the [facility] (Director on Call) for the Hospitalists listed on amion for assistance.  01/27/2024, 2:17 AM

## 2024-01-27 NOTE — Plan of Care (Signed)
  Problem: Acute Rehab PT Goals(only PT should resolve) Goal: Pt Will Go Supine/Side To Sit Outcome: Progressing Flowsheets (Taken 01/27/2024 1207) Pt will go Supine/Side to Sit: with minimal assist Goal: Patient Will Transfer Sit To/From Stand Outcome: Progressing Flowsheets (Taken 01/27/2024 1207) Patient will transfer sit to/from stand: with minimal assist Goal: Pt Will Transfer Bed To Chair/Chair To Bed Outcome: Progressing Flowsheets (Taken 01/27/2024 1207) Pt will Transfer Bed to Chair/Chair to Bed: with min assist Goal: Pt Will Ambulate Outcome: Progressing Flowsheets (Taken 01/27/2024 1207) Pt will Ambulate:  25 feet  with minimal assist  with moderate assist  with rolling walker   12:07 PM, 01/27/24 Lynwood Music, MPT Physical Therapist with [facility] [phone] office [phone] mobile phone

## 2024-01-27 NOTE — Progress Notes (Signed)
 Mobility Specialist Progress Note:    01/27/24 1211  Mobility  Activity Pivoted/transferred from chair to bed  Level of Assistance Minimal assist, patient does 75% or more  Assistive Device Front wheel walker  Distance Ambulated (ft) 3 ft  Range of Motion/Exercises Active;All extremities  Activity Response Tolerated well  Mobility Referral Yes  Mobility visit 1 Mobility  Mobility Specialist Start Time (ACUTE ONLY) 1211  Mobility Specialist Stop Time (ACUTE ONLY) 1230  Mobility Specialist Time Calculation (min) (ACUTE ONLY) 19 min   Pt received in chair, agreeable to mobility. Required MinA to stand and transfer with RW. Tolerated well, c/o weakness and soreness in back. All needs met.  Giovoni Bunch Mobility Specialist Please contact via Special educational needs teacher or  Rehab office at [phone]

## 2024-01-27 NOTE — Evaluation (Signed)
 Occupational Therapy Evaluation Patient Details Name: Chad Hensley MRN: [id number] DOB: 09/25/1950 Today's Date: 01/27/2024   History of Present Illness   Chad Hensley is a 73 y.o. 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 Reports that he was walking around his couch and tripped and fell, bumping things on the way down. And was unable to stand, on the ground for about 5 hours. Reports that he has had frequent falls, about 2 per month over the last 6 months. Reports mechanical falls. Sometimes loses his balance or will get dizzy and falls. No syncopal episode, no chest pain. He reports having uncontrolled hypertension at home, denies any low BP. Sometimes attributes dizziness to severe hypertension SBP ~ 190s. Also notes that has developed new tremor over the past few weeks which has been impacting his functional status. Since the fall feels generally sore, associated skin tears.      Otherwise reports recently having recent cough over the past 4 weeks which has been nonproductive, no other URI symptoms. Gradually improving. Feels dehydrated currently. Denies fever, chills, N/V/D.      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 Patient is aware these hallucinations are not real and reports improved since medication was adjusted. (per MD)     Clinical Impressions Pt agreeable to OT and PT co-evaluation. Pt lives independently at baseline with use of SPC for mobility. Pt required use of RW today due to balance deficits. CGA for bed mobility to sit at EOB. B UE generally weak with mild A/ROM deficits at the shoulder. Pt also was unable to don a sock seated in the chair but was able to don. Observation indicates level of mod to max A for lower body ADL tasks. Pt left in the chair with call bell within reach. Pt will benefit from  continued OT in the hospital to increase strength, balance, and endurance for safe ADL's.        If plan is discharge home, recommend the following:   A lot of help with walking and/or transfers;A lot of help with bathing/dressing/bathroom;Assistance with cooking/housework;Assist for transportation;Help with stairs or ramp for entrance     Functional Status Assessment   Patient has had a recent decline in their functional status and demonstrates the ability to make significant improvements in function in a reasonable and predictable amount of time.     Equipment Recommendations   None recommended by OT             Precautions/Restrictions   Precautions Precautions: Fall Recall of Precautions/Restrictions: Intact Restrictions Weight Bearing Restrictions Per Provider Order: No     Mobility Bed Mobility Overal bed mobility: Needs Assistance Bed Mobility: Supine to Sit     Supine to sit: Contact guard     General bed mobility comments: slow and labored movement; extended time to come to sit at EOB.    Transfers Overall transfer level: Needs assistance Equipment used: Rolling walker (2 wheels) Transfers: Sit to/from Stand, Bed to chair/wheelchair/BSC Sit to Stand: Mod assist     Step pivot transfers: Mod assist     General transfer comment: very slow and labored; unsteady with RW for EOB to chair      Balance Overall balance assessment: Needs assistance Sitting-balance support: No upper extremity supported, Feet supported Sitting balance-Leahy Scale: Fair Sitting balance - Comments: seated at  EOB   Standing balance support: Bilateral upper extremity supported, During functional activity, Reliant on assistive device for balance Standing balance-Leahy Scale: Poor Standing balance comment: poor to fair with RW                           ADL either performed or assessed with clinical judgement   ADL Overall ADL's : Needs  assistance/impaired     Grooming: Set up;Sitting   Upper Body Bathing: Set up;Sitting   Lower Body Bathing: Moderate assistance;Maximal assistance;Sitting/lateral leans   Upper Body Dressing : Set up;Sitting   Lower Body Dressing: Moderate assistance;Maximal assistance;Sitting/lateral leans Lower Body Dressing Details (indicate cue type and reason): Able to don sock seated in the chair but required assist to don. Toilet Transfer: Moderate assistance;Rolling walker (2 wheels);Stand-pivot Statistician Details (indicate cue type and reason): EOB to chair with RW Toileting- Clothing Manipulation and Hygiene: Moderate assistance;Sitting/lateral lean;Maximal assistance       Functional mobility during ADLs: Moderate assistance;Rolling walker (2 wheels) General ADL Comments: Ambualted ~5 feet total forward and backward besdie teh bed with RW. Tremors noted when standing and when ambulating with RW.     Vision Baseline Vision/History: 1 Wears glasses Ability to See in Adequate Light: 1 Impaired Patient Visual Report: No change from baseline Vision Assessment?: No apparent visual deficits     Perception Perception: Not tested       Praxis Praxis: Not tested       Pertinent Vitals/Pain Pain Assessment Pain Assessment: Faces Faces Pain Scale: Hurts a little bit Pain Location: general pain with movement Pain Descriptors / Indicators: Discomfort Pain Intervention(s): Monitored during session, Repositioned     Extremity/Trunk Assessment Upper Extremity Assessment Upper Extremity Assessment: Generalized weakness (3-/5 shoulder flexion bilaterally; mild limitation of shoulder flexion A/ROm bilaterally as well; generally weak.)   Lower Extremity Assessment Lower Extremity Assessment: Defer to PT evaluation   Cervical / Trunk Assessment Cervical / Trunk Assessment: Kyphotic   Communication Communication Communication: No apparent difficulties   Cognition Arousal:  Alert Behavior During Therapy: WFL for tasks assessed/performed Cognition: No apparent impairments                               Following commands: Intact       Cueing  General Comments   Cueing Techniques: Verbal cues;Tactile cues                 Home Living Family/patient expects to be discharged to:: Private residence Living Arrangements: Alone Available Help at Discharge: Family;Available PRN/intermittently (Pt thinks daughter can be present 24/7.) Type of Home: House Home Access: Ramped entrance     Home Layout: Laundry or work area in basement;Two level;Able to live on main level with bedroom/bathroom Alternate Level Stairs-Number of Steps: 12   Bathroom Shower/Tub: Walk-in shower;Tub/shower unit   Bathroom Toilet: Standard Bathroom Accessibility: Yes How Accessible: Accessible via wheelchair;Accessible via walker Home Equipment: Cane - single point;Shower seat;Grab bars - tub/shower;Rollator (4 wheels);Wheelchair - manual;BSC/3in1   Additional Comments: Daughter stays with him overnight sometimes. Pt reports his daughter can be with him 24/7. Info per previous admission and pt report.      Prior Functioning/Environment Prior Level of Function : Independent/Modified Independent;Driving             Mobility Comments: Mostly ambulates inside with SPC. ADLs Comments: Independent ADL and IADL    OT Problem List:  Decreased range of motion;Decreased strength;Decreased activity tolerance;Impaired balance (sitting and/or standing)   OT Treatment/Interventions: Self-care/ADL training;Therapeutic exercise;Therapeutic activities;Patient/family education;Balance training;DME and/or AE instruction      OT Goals(Current goals can be found in the care plan section)   Acute Rehab OT Goals Patient Stated Goal: return home OT Goal Formulation: With patient Time For Goal Achievement: 02/10/24 Potential to Achieve Goals: Good   OT Frequency:  Min  3X/week    Co-evaluation PT/OT/SLP Co-Evaluation/Treatment: Yes Reason for Co-Treatment: To address functional/ADL transfers   OT goals addressed during session: ADL's and self-care      AM-PAC OT 6 Clicks Daily Activity     Outcome Measure Help from another person eating meals?: None Help from another person taking care of personal grooming?: A Little Help from another person toileting, which includes using toliet, bedpan, or urinal?: A Lot Help from another person bathing (including washing, rinsing, drying)?: A Lot Help from another person to put on and taking off regular upper body clothing?: A Little Help from another person to put on and taking off regular lower body clothing?: A Lot 6 Click Score: 16   End of Session Equipment Utilized During Treatment: Rolling walker (2 wheels)  Activity Tolerance: Patient tolerated treatment well Patient left: in chair;with call bell/phone within reach  OT Visit Diagnosis: Unsteadiness on feet (R26.81);Other abnormalities of gait and mobility (R26.89);Muscle weakness (generalized) (M62.81);History of falling (Z91.81)                Time: 9175-9151 OT Time Calculation (min): 24 min Charges:  OT General Charges $OT Visit: 1 Visit OT Evaluation $OT Eval Low Complexity: 1 Low  Asim Gersten OT, MOT  Jayson Person 01/27/2024, 11:39 AM

## 2024-01-27 NOTE — Progress Notes (Signed)
 ASSUMPTION OF CARE NOTE   01/27/2024 3:38 PM  Chad Hensley was seen and examined.  The H&P by the admitting provider, orders, imaging was reviewed.  Please see new orders.  Will continue to follow.   Vitals:   01/27/24 0807 01/27/24 1304  BP: (!) 141/82 117/66  Pulse: 92 96  Resp: 18 19  Temp: 98.3 F (36.8 C) 98.2 F (36.8 C)  SpO2: 97% 94%    Results for orders placed or performed during the hospital encounter of 01/26/24  Urinalysis, Routine w reflex microscopic -Urine, Clean Catch   Collection Time: 01/26/24  9:11 PM  Result Value Ref Range   Color, Urine YELLOW YELLOW   APPearance CLEAR CLEAR   Specific Gravity, Urine 1.005 1.005 - 1.030   pH 7.0 5.0 - 8.0   Glucose, UA NEGATIVE NEGATIVE mg/dL   Hgb urine dipstick NEGATIVE NEGATIVE   Bilirubin Urine NEGATIVE NEGATIVE   Ketones, ur NEGATIVE NEGATIVE mg/dL   Protein, ur NEGATIVE NEGATIVE mg/dL   Nitrite NEGATIVE NEGATIVE   Leukocytes,Ua NEGATIVE NEGATIVE  CBC with Differential   Collection Time: 01/26/24  9:20 PM  Result Value Ref Range   WBC 15.7 (H) 4.0 - 10.5 K/uL   RBC 5.10 4.22 - 5.81 MIL/uL   Hemoglobin 15.7 13.0 - 17.0 g/dL   HCT 54.1 60.9 - 47.9 %   MCV 89.8 80.0 - 100.0 fL   MCH 30.8 26.0 - 34.0 pg   MCHC 34.3 30.0 - 36.0 g/dL   RDW 86.2 88.4 - 84.4 %   Platelets 263 150 - 400 K/uL   nRBC 0.0 0.0 - 0.2 %   Neutrophils Relative % 83 %   Neutro Abs 12.9 (H) 1.7 - 7.7 K/uL   Lymphocytes Relative 7 %   Lymphs Abs 1.2 0.7 - 4.0 K/uL   Monocytes Relative 10 %   Monocytes Absolute 1.6 (H) 0.1 - 1.0 K/uL   Eosinophils Relative 0 %   Eosinophils Absolute 0.0 0.0 - 0.5 K/uL   Basophils Relative 0 %   Basophils Absolute 0.0 0.0 - 0.1 K/uL   Immature Granulocytes 0 %   Abs Immature Granulocytes 0.06 0.00 - 0.07 K/uL  Comprehensive metabolic panel   Collection Time: 01/26/24  9:20 PM  Result Value Ref Range   Sodium 142 135 - 145 mmol/L   Potassium 4.1 3.5 - 5.1 mmol/L   Chloride 105 98 - 111 mmol/L   CO2  25 22 - 32 mmol/L   Glucose, Bld 99 70 - 99 mg/dL   BUN 13 8 - 23 mg/dL   Creatinine, Ser 8.88 0.61 - 1.24 mg/dL   Calcium  9.4 8.9 - 10.3 mg/dL   Total Protein 6.5 6.5 - 8.1 g/dL   Albumin 3.9 3.5 - 5.0 g/dL   AST 28 15 - 41 U/L   ALT 22 0 - 44 U/L   Alkaline Phosphatase 82 38 - 126 U/L   Total Bilirubin 1.3 (H) 0.0 - 1.2 mg/dL   GFR, Estimated >39 >39 mL/min   Anion gap 12 5 - 15  Protime-INR   Collection Time: 01/26/24  9:20 PM  Result Value Ref Range   Prothrombin Time 14.7 11.4 - 15.2 seconds   INR 1.1 0.8 - 1.2  Resp panel by RT-PCR (RSV, Flu A&B, Covid) Anterior Nasal Swab   Collection Time: 01/26/24  9:27 PM   Specimen: Anterior Nasal Swab  Result Value Ref Range   SARS Coronavirus 2 by RT PCR NEGATIVE NEGATIVE   Influenza  A by PCR NEGATIVE NEGATIVE   Influenza B by PCR NEGATIVE NEGATIVE   Resp Syncytial Virus by PCR NEGATIVE NEGATIVE  CK   Collection Time: 01/26/24  9:27 PM  Result Value Ref Range   Total CK 271 49 - 397 U/L  Troponin I (High Sensitivity)   Collection Time: 01/26/24  9:27 PM  Result Value Ref Range   Troponin I (High Sensitivity) 55 (H) <18 ng/L  POC CBG, ED   Collection Time: 01/26/24  9:30 PM  Result Value Ref Range   Glucose-Capillary 93 70 - 99 mg/dL  Blood Culture (routine x 2)   Collection Time: 01/26/24 10:55 PM   Specimen: BLOOD  Result Value Ref Range   Specimen Description BLOOD BLOOD LEFT ARM    Special Requests      BOTTLES DRAWN AEROBIC AND ANAEROBIC Blood Culture adequate volume   Culture      NO GROWTH < 12 HOURS Performed at [facility], [address] [place], [place] [address]    Report Status PENDING   Blood Culture (routine x 2)   Collection Time: 01/26/24 10:58 PM   Specimen: BLOOD  Result Value Ref Range   Specimen Description BLOOD BLOOD LEFT HAND    Special Requests      BOTTLES DRAWN AEROBIC AND ANAEROBIC Blood Culture adequate volume   Culture      NO GROWTH < 12 HOURS Performed at [facility], [address], [place], [place] [address]    Report Status PENDING   Lactic acid, plasma   Collection Time: 01/26/24 10:58 PM  Result Value Ref Range   Lactic Acid, Venous 1.4 0.5 - 1.9 mmol/L  Troponin I (High Sensitivity)   Collection Time: 01/26/24 10:58 PM  Result Value Ref Range   Troponin I (High Sensitivity) 58 (H) <18 ng/L  Respiratory (~20 pathogens) panel by PCR   Collection Time: 01/27/24  2:52 AM   Specimen: Nasopharyngeal Swab; Respiratory  Result Value Ref Range   Adenovirus NOT DETECTED NOT DETECTED   Coronavirus 229E NOT DETECTED NOT DETECTED   Coronavirus HKU1 NOT DETECTED NOT DETECTED   Coronavirus NL63 NOT DETECTED NOT DETECTED   Coronavirus OC43 NOT DETECTED NOT DETECTED   Metapneumovirus NOT DETECTED NOT DETECTED   Rhinovirus / Enterovirus NOT DETECTED NOT DETECTED   Influenza A NOT DETECTED NOT DETECTED   Influenza B NOT DETECTED NOT DETECTED   Parainfluenza Virus 1 NOT DETECTED NOT DETECTED   Parainfluenza Virus 2 NOT DETECTED NOT DETECTED   Parainfluenza Virus 3 NOT DETECTED NOT DETECTED   Parainfluenza Virus 4 NOT DETECTED NOT DETECTED   Respiratory Syncytial Virus NOT DETECTED NOT DETECTED   Bordetella pertussis NOT DETECTED NOT DETECTED   Bordetella Parapertussis NOT DETECTED NOT DETECTED   Chlamydophila pneumoniae NOT DETECTED NOT DETECTED   Mycoplasma pneumoniae NOT DETECTED NOT DETECTED  Basic metabolic panel with GFR   Collection Time: 01/27/24  3:35 AM  Result Value Ref Range   Sodium 142 135 - 145 mmol/L   Potassium 3.9 3.5 - 5.1 mmol/L   Chloride 106 98 - 111 mmol/L   CO2 24 22 - 32 mmol/L   Glucose, Bld 114 (H) 70 - 99 mg/dL   BUN 12 8 - 23 mg/dL   Creatinine, Ser 9.04 0.61 - 1.24 mg/dL   Calcium  9.2 8.9 - 10.3 mg/dL   GFR, Estimated >39 >39 mL/min   Anion gap 12 5 - 15  CBC   Collection Time: 01/27/24  3:35 AM  Result  Value Ref Range   WBC 12.5 (H) 4.0 - 10.5 K/uL   RBC 5.13 4.22 - 5.81 MIL/uL   Hemoglobin 15.7 13.0 - 17.0 g/dL   HCT 53.3  60.9 - 47.9 %   MCV 90.8 80.0 - 100.0 fL   MCH 30.6 26.0 - 34.0 pg   MCHC 33.7 30.0 - 36.0 g/dL   RDW 86.2 88.4 - 84.4 %   Platelets 242 150 - 400 K/uL   nRBC 0.0 0.0 - 0.2 %  Magnesium   Collection Time: 01/27/24  3:35 AM  Result Value Ref Range   Magnesium 2.3 1.7 - 2.4 mg/dL  Phosphorus   Collection Time: 01/27/24  3:35 AM  Result Value Ref Range   Phosphorus 2.8 2.5 - 4.6 mg/dL  TSH   Collection Time: 01/27/24  3:35 AM  Result Value Ref Range   TSH 2.199 0.350 - 4.500 uIU/mL  Blood gas, venous   Collection Time: 01/27/24  7:17 AM  Result Value Ref Range   pH, Ven 7.41 7.25 - 7.43   pCO2, Ven 48 44 - 60 mmHg   pO2, Ven <31 (LL) 32 - 45 mmHg   Bicarbonate 30.4 (H) 20.0 - 28.0 mmol/L   Acid-Base Excess 4.6 (H) 0.0 - 2.0 mmol/L   O2 Saturation 47.8 %   Patient temperature 36.9    Collection site RIGHT ANTECUBITAL    Drawn by [id number]   Ammonia   Collection Time: 01/27/24  7:17 AM  Result Value Ref Range   Ammonia <13 9 - 35 umol/L     C. Vicci, MD [facility]   01/26/2024  8:05 PM How to contact the [facility] Attending or Consulting provider 7A - 7P or covering provider during after hours 7P -7A, for this patient?  Check the care team in [facility] and look for a) attending/consulting TR[facility] provider listed and b) the TR[facility] team listed Log into [email or web address] and use [facility]'s universal password to access. If you do not have the password, please contact the hospital operator. Locate the [facility] provider you are looking for under [facility] and page to a number that you can be directly reached. If you still have difficulty reaching the provider, please page the [facility] (Director on Call) for the Hospitalists listed on amion for assistance.

## 2024-01-28 ENCOUNTER — Observation Stay (HOSPITAL_COMMUNITY)

## 2024-01-28 DIAGNOSIS — R443 Hallucinations, unspecified: Secondary | ICD-10-CM | POA: Diagnosis not present

## 2024-01-28 DIAGNOSIS — R5381 Other malaise: Secondary | ICD-10-CM

## 2024-01-28 DIAGNOSIS — R7989 Other specified abnormal findings of blood chemistry: Secondary | ICD-10-CM | POA: Diagnosis not present

## 2024-01-28 DIAGNOSIS — R251 Tremor, unspecified: Secondary | ICD-10-CM

## 2024-01-28 DIAGNOSIS — W1830XA Fall on same level, unspecified, initial encounter: Secondary | ICD-10-CM | POA: Diagnosis not present

## 2024-01-28 LAB — GLUCOSE, CAPILLARY: Glucose-Capillary: 162 mg/dL — ABNORMAL HIGH (ref 70–99)

## 2024-01-28 MED ORDER — CARVEDILOL 12.5 MG PO TABS
12.5000 mg | ORAL_TABLET | Freq: Two times a day (BID) | ORAL | Status: DC
  Administered 2024-01-28 – 2024-01-31 (×7): 12.5 mg via ORAL
  Filled 2024-01-28 (×7): qty 1

## 2024-01-28 MED ORDER — RISPERIDONE 1 MG PO TABS
1.0000 mg | ORAL_TABLET | Freq: Every day | ORAL | Status: DC
  Administered 2024-01-29 – 2024-01-30 (×2): 1 mg via ORAL
  Filled 2024-01-28 (×3): qty 1

## 2024-01-28 MED ORDER — HYDRALAZINE HCL 20 MG/ML IJ SOLN
10.0000 mg | INTRAMUSCULAR | Status: DC | PRN
  Administered 2024-01-28: 10 mg via INTRAVENOUS
  Filled 2024-01-28: qty 1

## 2024-01-28 NOTE — TOC Progression Note (Signed)
 Transition of Care [facility]) - Progression Note    Patient Details  Name: Chad Hensley MRN: [id number] Date of Birth: Jan 04, 1951  [facility] CM/SW Contact  Noreen KATHEE [place], [place] Phone Number: 01/28/2024, 2:17 PM  Clinical Narrative:     CSW spoke with Daughter first regarding only bed offer at [facility]. Daughter referred CSW to speak with patient about bed offer. CSW went to speak with patient about bed offer and patient stated that he need to speak with his daughter . CSW asked patient, if it was fine to get her on the phone and patient agreed. Daughter was on speaker phone and shared that same information, CSW shared about short-term rehab at [facility].  Daughter later on called CSW back after she spoke with patient separately. 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 Furthermore, daughter asked CSW to accept bed offer with [facility], which CSW did with Debbie. When speaking with patient, he was not clear at all , mumbling and telling CSW to check with his daughter.    Daughter later on called CSW and was very frustrated with patient because he refused therapy why they were on the phone. Daughter stated that she want help patient, but if he does not  want to help himself , I am staying out of it. Daughter also raised questions about patient TTS consult. TTS has not visited patient yet, CSW checked with MD and nurse. Also shared with MD that daughter said that patient told her that the building outside his window was moving and clear bugs were crawling on his skin. CSW will continue to follow.   Expected Discharge Plan: Skilled Nursing Facility Barriers to Discharge: Continued Medical Work up     Expected Discharge Plan and Services In-house Referral: Clinical Social Work Discharge Planning Services: NA Post Acute Care  Choice: Skilled Nursing Facility Living arrangements for the past 2 months: Single Family Home                 DME Arranged: N/A DME Agency: NA                   Social Drivers of Health (SDOH) Interventions SDOH Screenings   Food Insecurity: No Food Insecurity (01/27/2024)  Housing: Low Risk  (01/27/2024)  Transportation Needs: No Transportation Needs (01/27/2024)  Utilities: Not At Risk (01/27/2024)  Depression (PHQ2-9): High Risk (10/12/2023)  Social Connections: Unknown (01/27/2024)  Tobacco Use: Medium Risk (01/26/2024)    Readmission Risk Interventions     No data to display

## 2024-01-28 NOTE — Progress Notes (Signed)
   01/28/24 2037  Vitals  Temp 98.3 F (36.8 C)  Temp Source Oral  BP (!) 123/93  MAP (mmHg) 102  BP Location Right Arm  BP Method Automatic  Patient Position (if appropriate) Sitting  Pulse Rate (!) 102  Pulse Rate Source Dinamap  Resp 18  Level of Consciousness  Level of Consciousness Alert  MEWS COLOR  MEWS Score Color Green  Oxygen Therapy  SpO2 94 %  O2 Device Room Air  Pain Assessment  Pain Scale 0-10  Pain Score 4  MEWS Score  MEWS Temp 0  MEWS Systolic 0  MEWS Pulse 1  MEWS RR 0  MEWS LOC 0  MEWS Score 1   Vitals after pt fall. Loud noise heard from pt's room, pt found to be in the floor. MD Segars made aware.

## 2024-01-28 NOTE — Progress Notes (Signed)
 Mobility Specialist Progress Note:    01/28/24 1220  Mobility  Activity Pivoted/transferred from bed to chair  Level of Assistance Minimal assist, patient does 75% or more  Assistive Device Front wheel walker  Distance Ambulated (ft) 3 ft  Range of Motion/Exercises Active;All extremities  Activity Response Tolerated well  Mobility Referral Yes  Mobility visit 1 Mobility  Mobility Specialist Start Time (ACUTE ONLY) 1220  Mobility Specialist Stop Time (ACUTE ONLY) 1240  Mobility Specialist Time Calculation (min) (ACUTE ONLY) 20 min   Pt received in bed, agreeable to mobility. Required MinA to stand and transfer with RW. Tolerated well, some confusion about what is going on and how he ended up in the hospital. Alarm on, call bell in reach. All need met.   Jarika Robben Mobility Specialist Please contact via Special educational needs teacher or  Rehab office at [phone]

## 2024-01-28 NOTE — Progress Notes (Addendum)
 PROGRESS NOTE   Chad Hensley  FMW:[id number] DOB: May 07, 1950 DOA: 01/26/2024 PCP: [facility]   Chief Complaint  Patient presents with   Fall   Level of care: Med-Surg  Brief Admission History:  73 y.o. 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 Reports that he was walking around his couch and tripped and fell, bumping things on the way down. And was unable to stand, on the ground for about 5 hours. Reports that he has had frequent falls, about 2 per month over the last 6 months. Reports mechanical falls. Sometimes loses his balance or will get dizzy and falls. No syncopal episode, no chest pain. He reports having uncontrolled hypertension at home, denies any low BP. Sometimes attributes dizziness to severe hypertension SBP ~ 190s. Also notes that has developed new tremor over the past few weeks which has been impacting his functional status. Since the fall feels generally sore, associated skin tears.    Otherwise reports recently having recent cough over the past 4 weeks which has been nonproductive, no other URI symptoms. Gradually improving. Feels dehydrated currently. Denies fever, chills, N/V/D.    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 Patient is aware these hallucinations are not real and reports improved since medication was adjusted   Assessment and Plan:  Ground level fall  Deconditioning Hx chronic frequent fall ~ 12 in 6 mo. Reported mechanical fall PTA, on ground x 5 hr unable to stand. Note tremor and occasional myoclonus, question if may be affecting mobility. Labs mostly unremarkable, WBC 16 per below. CK wnl. Imaging including chest x-ray, pelvic x-ray, CT head, C-spine, abdomen pelvis, T/L-spine without acute traumatic injuries.  --pt responded well to IV fluid hydration  -PT/OT  evaluation -- with recommendation for SNF placement  -- Pt is now medically cleared and awaiting a TTS consultation   SIRS---resolved now  Leukocytosis-- improving   Hx of cough x 4 weeks, without other localizing infectious symptoms., feels dehydrated reports decreased PO. On initial evaluation tachycardic in the low 100s, WBC 16.  No other SIRS criteria.  Heart rate improved with hydration alone.  Suspect hypovolemia mainly.  Unclear source of his leukocytosis, possibly LRI. Imaging unrevealing.  - WBC trending down now    Acute myocardial injury  HS troponin is flat 55 -> 58. EKG with no ischemic changes. No hx of chest pain. Suspect demand from hypovolemia.  -- Management directed at above.    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 -- daughter requesting psychiatry evaluation in hospital, she was informed that we don't have psychiatry here at [facility] but can obtain a TTS evaluation for medical management and I have placed the request on 9/25 for TTS consultation as patient is now medically cleared.     Incidental findings: Left inguinal hernia containing sigmoid colon without bowel obstruction 3 cm infrarenal abdominal aortic aneurysm: Ultrasound every 3 year recommended Diverticulosis Cholelithiasis   Diastolic heart failure: Last echo 02/2022 LVEF 60 to 65%.  Without acute exacerbation, suspect dry per above. Hold home lasix  (home 20 mg)   COPD/asthma, CHRF on 2 L O2: Continue on home O2. Continue home lama/laba/ics   Hypertension: resuming home medications now    HLD: Continue home atorvastatin  20 mg  Mood d/o, PTSD: Continue home Risperidone  0.75 mg nightly, Bupropion  300 mg daily, Prazosin  2 mg at bedtime, hydroxyzine  prn.     DVT prophylaxis: lovenox   Code Status: Full  Family Communication:  Disposition: pending placement     Consultants:  TTS Procedures:   Antimicrobials:    Subjective: Pt without complaints, didn't eat much breakfast today.    Objective: Vitals:   01/28/24 0346 01/28/24 0733 01/28/24 0831 01/28/24 0909  BP: (!) 144/89 (!) 159/101  (!) 164/104  Pulse: 83 91  91  Resp: 20 20    Temp: 98 F (36.7 C) (!) 97.2 F (36.2 C)    TempSrc:  Axillary    SpO2: 96% 92% 92%   Height:        Intake/Output Summary (Last 24 hours) at 01/28/2024 1538 Last data filed at 01/28/2024 1300 Gross per 24 hour  Intake 360 ml  Output 500 ml  Net -140 ml   There were no vitals filed for this visit. Examination:  General exam: Appears calm and comfortable  Respiratory system: Clear to auscultation. Respiratory effort normal. Cardiovascular system: normal S1 & S2 heard. No JVD, murmurs, rubs, gallops or clicks. No pedal edema. Gastrointestinal system: Abdomen is nondistended, soft and nontender. No organomegaly or masses felt. Normal bowel sounds heard. Central nervous system: Alert and oriented. No focal neurological deficits. Extremities: Symmetric 5 x 5 power. Skin: No rashes, lesions or ulcers. Psychiatry: Judgement and insight appear normal. Mood & affect appropriate.   Data Reviewed: I have personally reviewed following labs and imaging studies  CBC: Recent Labs  Lab 01/26/24 2120 01/27/24 0335  WBC 15.7* 12.5*  NEUTROABS 12.9*  --   HGB 15.7 15.7  HCT 45.8 46.6  MCV 89.8 90.8  PLT 263 242    Basic Metabolic Panel: Recent Labs  Lab 01/26/24 2120 01/27/24 0335  NA 142 142  K 4.1 3.9  CL 105 106  CO2 25 24  GLUCOSE 99 114*  BUN 13 12  CREATININE 1.11 0.95  CALCIUM  9.4 9.2  MG  --  2.3  PHOS  --  2.8    CBG: Recent Labs  Lab 01/26/24 2130 01/28/24 0922  GLUCAP 93 162*    Recent Results (from the past 240 hours)  Resp panel by RT-PCR (RSV, Flu A&B, Covid) Anterior Nasal Swab     Status: None   Collection Time: 01/26/24  9:27 PM   Specimen: Anterior Nasal Swab   Result Value Ref Range Status   SARS Coronavirus 2 by RT PCR NEGATIVE NEGATIVE Final    Comment: (NOTE) SARS-CoV-2 target nucleic acids are NOT DETECTED.  The SARS-CoV-2 RNA is generally detectable in upper respiratory specimens during the acute phase of infection. The lowest concentration of SARS-CoV-2 viral copies this assay can detect is 138 copies/mL. A negative result does not preclude SARS-Cov-2 infection and should not be used as the sole basis for treatment or other patient management decisions. A negative result may occur with  improper specimen collection/handling, submission of specimen other than nasopharyngeal swab, presence of viral mutation(s) within the areas targeted by this assay, and inadequate number of viral copies(<138 copies/mL). A negative result must be combined with clinical observations, patient history, and epidemiological information. The expected result is Negative.  Fact Sheet for Patients:  BloggerCourse.com  Fact Sheet for Healthcare Providers:  SeriousBroker.it  This test is no t yet approved or cleared by the [place]  FDA and  has been authorized for detection and/or diagnosis of SARS-CoV-2 by  FDA under an Emergency Use Authorization (EUA). This EUA will remain  in effect (meaning this test can be used) for the duration of the COVID-19 declaration under Section 564(b)(1) of the Act, 21 U.S.C.section 360bbb-3(b)(1), unless the authorization is terminated  or revoked sooner.       Influenza A by PCR NEGATIVE NEGATIVE Final   Influenza B by PCR NEGATIVE NEGATIVE Final    Comment: (NOTE) The Xpert Xpress SARS-CoV-2/FLU/RSV plus assay is intended as an aid in the diagnosis of influenza from Nasopharyngeal swab specimens and should not be used as a sole basis for treatment. Nasal washings and aspirates are unacceptable for Xpert Xpress SARS-CoV-2/FLU/RSV testing.  Fact Sheet for  Patients: BloggerCourse.com  Fact Sheet for Healthcare Providers: SeriousBroker.it  This test is not yet approved or cleared by the [place]  FDA and has been authorized for detection and/or diagnosis of SARS-CoV-2 by FDA under an Emergency Use Authorization (EUA). This EUA will remain in effect (meaning this test can be used) for the duration of the COVID-19 declaration under Section 564(b)(1) of the Act, 21 U.S.C. section 360bbb-3(b)(1), unless the authorization is terminated or revoked.     Resp Syncytial Virus by PCR NEGATIVE NEGATIVE Final    Comment: (NOTE) Fact Sheet for Patients: BloggerCourse.com  Fact Sheet for Healthcare Providers: SeriousBroker.it  This test is not yet approved or cleared by the [place]  FDA and has been authorized for detection and/or diagnosis of SARS-CoV-2 by FDA under an Emergency Use Authorization (EUA). This EUA will remain in effect (meaning this test can be used) for the duration of the COVID-19 declaration under Section 564(b)(1) of the Act, 21 U.S.C. section 360bbb-3(b)(1), unless the authorization is terminated or revoked.  Performed at [facility], [address], [place], [place] [address]   Blood Culture (routine x 2)     Status: None (Preliminary result)   Collection Time: 01/26/24 10:55 PM   Specimen: BLOOD  Result Value Ref Range Status   Specimen Description BLOOD BLOOD LEFT ARM  Final   Special Requests   Final    BOTTLES DRAWN AEROBIC AND ANAEROBIC Blood Culture adequate volume   Culture   Final    NO GROWTH 2 DAYS Performed at [facility], [address] [place], [place] [address]    Report Status PENDING  Incomplete  Blood Culture (routine x 2)     Status: None (Preliminary result)   Collection Time: 01/26/24 10:58 PM   Specimen: BLOOD  Result Value Ref Range Status   Specimen Description BLOOD BLOOD LEFT HAND   Final   Special Requests   Final    BOTTLES DRAWN AEROBIC AND ANAEROBIC Blood Culture adequate volume   Culture   Final    NO GROWTH 2 DAYS Performed at [facility], [address], [place], [place] [address]    Report Status PENDING  Incomplete  Respiratory (~20 pathogens) panel by PCR     Status: None   Collection Time: 01/27/24  2:52 AM   Specimen: Nasopharyngeal Swab; Respiratory  Result Value Ref Range Status   Adenovirus NOT DETECTED NOT DETECTED Final   Coronavirus 229E NOT DETECTED NOT DETECTED Final    Comment: (NOTE) The Coronavirus on the Respiratory Panel, DOES NOT test for the novel  Coronavirus (2019 nCoV)    Coronavirus HKU1 NOT DETECTED NOT DETECTED Final   Coronavirus NL63 NOT DETECTED NOT DETECTED Final   Coronavirus OC43 NOT DETECTED NOT DETECTED Final   Metapneumovirus NOT DETECTED NOT DETECTED Final  Rhinovirus / Enterovirus NOT DETECTED NOT DETECTED Final   Influenza A NOT DETECTED NOT DETECTED Final   Influenza B NOT DETECTED NOT DETECTED Final   Parainfluenza Virus 1 NOT DETECTED NOT DETECTED Final   Parainfluenza Virus 2 NOT DETECTED NOT DETECTED Final   Parainfluenza Virus 3 NOT DETECTED NOT DETECTED Final   Parainfluenza Virus 4 NOT DETECTED NOT DETECTED Final   Respiratory Syncytial Virus NOT DETECTED NOT DETECTED Final   Bordetella pertussis NOT DETECTED NOT DETECTED Final   Bordetella Parapertussis NOT DETECTED NOT DETECTED Final   Chlamydophila pneumoniae NOT DETECTED NOT DETECTED Final   Mycoplasma pneumoniae NOT DETECTED NOT DETECTED Final    Comment: Performed at [facility], [address], [place], [place] [address]     Radiology Studies: CT ABDOMEN PELVIS W CONTRAST Result Date: 01/27/2024 CLINICAL DATA:  Fall. EXAM: CT ABDOMEN AND PELVIS WITH CONTRAST TECHNIQUE: Multidetector CT imaging of the abdomen and pelvis was performed using the standard protocol following bolus administration of intravenous contrast. RADIATION DOSE  REDUCTION: This exam was performed according to the departmental dose-optimization program which includes automated exposure control, adjustment of the mA and/or kV according to patient size and/or use of iterative reconstruction technique. CONTRAST:  OMNIPAQUE  IOHEXOL  300 MG/ML  SOLN COMPARISON:  Pelvis today FINDINGS: Lower chest: No acute findings.  Moderate-sized hiatal hernia. Hepatobiliary: Multiple layering gallstones within the gallbladder. No focal hepatic abnormality or biliary ductal dilatation. Pancreas: No focal abnormality or ductal dilatation. Spleen: No focal abnormality.  Normal size. Adrenals/Urinary Tract: No adrenal abnormality. No focal renal abnormality. No stones or hydronephrosis. Urinary bladder is unremarkable. Stomach/Bowel: Normal appendix. Sigmoid diverticulosis. There is a moderate-sized left inguinal hernia containing a loop of sigmoid colon which is the cause of the lucency seen in the inguinal region on prior pelvis x-ray. No bowel obstruction. Stomach and small bowel decompressed. Vascular/Lymphatic: Mild infrarenal abdominal aortic aneurysm, 3 cm. Diffuse aortic atherosclerosis. No adenopathy. Reproductive: No visible focal abnormality. Other: No free fluid or free air. Musculoskeletal: No acute bony abnormality. IMPRESSION: The lucency seen within the left inguinal region on prior pelvis study represents a left inguinal hernia containing sigmoid colon. No bowel obstruction. Sigmoid diverticulosis. Cholelithiasis. 3 cm infrarenal abdominal aortic aneurysm. Recommend follow-up ultrasound every 3 years. (Ref.: J Vasc Surg. 2018; 67:2-77 and J Am Coll Radiol 2013;10(10):789-794.) Electronically Signed   By: Franky Crease M.D.   On: 01/27/2024 00:12   CT Thoracic Spine Wo Contrast Result Date: 01/26/2024 EXAM: CT THORACIC SPINE WITHOUT CONTRAST 01/26/2024 10:53:57 PM TECHNIQUE: CT of the thoracic spine was performed without the administration of intravenous contrast.  Multiplanar reformatted images are provided for review. Automated exposure control, iterative reconstruction, and/or weight based adjustment of the mA/kV was utilized to reduce the radiation dose to as low as reasonably achievable. COMPARISON: None available. CLINICAL HISTORY: Back trauma, no prior imaging (Age >= 16y). Pt lives at home and states he tripped and fell. Neighbor came to check on him and didn't come to door, law enforcement went in to find pt on floo. FINDINGS: BONES AND ALIGNMENT: Normal vertebral body heights. No acute fracture or suspicious bone lesion. Normal alignment. DEGENERATIVE CHANGES: Mild degenerative changes of the mid thoracic spine. SOFT TISSUES: Moderate hiatal hernia. Mild thoracic aortic atherosclerosis. Moderate coronary atherosclerosis of the LAD. IMPRESSION: 1. No acute abnormality of the thoracic spine. Electronically signed by: Pinkie Pebbles MD 01/26/2024 11:09 PM EDT RP Workstation: [id number]   CT Lumbar Spine Wo Contrast Result Date: 01/26/2024 EXAM:  CT OF THE LUMBAR SPINE WITHOUT CONTRAST 01/26/2024 10:53:57 PM TECHNIQUE: CT of the lumbar spine was performed without the administration of intravenous contrast. Multiplanar reformatted images are provided for review. Automated exposure control, iterative reconstruction, and/or weight based adjustment of the mA/kV was utilized to reduce the radiation dose to as low as reasonably achievable. COMPARISON: None available. CLINICAL HISTORY: Back trauma, no prior imaging (Age >= 16y). Pt lives at home and states he tripped and fell. Neighbor came to check on him and didn't come to door, law enforcement went in to find pt on floo. FINDINGS: BONES AND ALIGNMENT: Normal vertebral body heights. No acute fracture or suspicious bone lesion. Normal alignment. DEGENERATIVE CHANGES: Mild degenerative changes of the upper / mid lumbar spine. SOFT TISSUES: 3.2 cm fusiform infrarenal abdominal aortic aneurysm (coronal image 16).  Atherosclerotic calcification of the abdominal aorta and branch vessels. IMPRESSION: 1. No evidence of acute traumatic injury. 2. 3.2 cm fusiform infrarenal abdominal aortic aneurysm. Recommend surveillance ultrasound in 3 years. Electronically signed by: Pinkie Pebbles MD 01/26/2024 11:06 PM EDT RP Workstation: [id number]   CT Cervical Spine Wo Contrast Result Date: 01/26/2024 EXAM: CT CERVICAL SPINE WITHOUT CONTRAST 01/26/2024 10:53:57 PM TECHNIQUE: CT of the cervical spine was performed without the administration of intravenous contrast. Multiplanar reformatted images are provided for review. Automated exposure control, iterative reconstruction, and/or weight based adjustment of the mA/kV was utilized to reduce the radiation dose to as low as reasonably achievable. COMPARISON: None available. CLINICAL HISTORY: Neck trauma (Age >= 65y). Pt lives at home and states he tripped and fell. Neighbor came to check on him and didn't come to door, law enforcement went in to find pt on floo. FINDINGS: CERVICAL SPINE: BONES AND ALIGNMENT: No acute fracture or traumatic malalignment. DEGENERATIVE CHANGES: Mild degenerative changes of the mid cervical spine. SOFT TISSUES: No prevertebral soft tissue swelling. IMPRESSION: 1. No acute abnormality of the cervical spine. Electronically signed by: Pinkie Pebbles MD 01/26/2024 11:01 PM EDT RP Workstation: [id number]   CT Head Wo Contrast Result Date: 01/26/2024 EXAM: CT HEAD WITHOUT CONTRAST 01/26/2024 10:53:57 PM TECHNIQUE: CT of the head was performed without the administration of intravenous contrast. Automated exposure control, iterative reconstruction, and/or weight based adjustment of the mA/kV was utilized to reduce the radiation dose to as low as reasonably achievable. COMPARISON: 02/16/2022 CLINICAL HISTORY: Head trauma, minor (Age >= 65y). Pt lives at home and states he tripped and fell. Neighbor came to check on him and didn't come to door, law enforcement went  in to find pt on floo. FINDINGS: BRAIN AND VENTRICLES: No acute hemorrhage. No evidence of acute infarct. No hydrocephalus. No extra-axial collection. No mass effect or midline shift. Mild intracranial atherosclerosis. Mild global cortical atrophy. Subcortical and periventricular small vessel ischemic changes. ORBITS: No acute abnormality. SINUSES: No acute abnormality. SOFT TISSUES AND SKULL: No acute soft tissue abnormality. No skull fracture. IMPRESSION: 1. No acute intracranial abnormality. Electronically signed by: Pinkie Pebbles MD 01/26/2024 11:00 PM EDT RP Workstation: [id number]   DG Pelvis Portable Result Date: 01/26/2024 EXAM: 1 or 2 VIEW(S) XRAY OF THE PELVIS 01/26/2024 10:13:00 PM COMPARISON: None available. CLINICAL HISTORY: fall. Elevated BP @ time of imaging (176/104), fell earlier this evening FINDINGS: BONES AND JOINTS: No displaced fracture identified. SOFT TISSUES: Mild subcutaneous gas within the left inguinal region overlying the inferior pubic ramus. IMPRESSION: 1. No displaced fracture identified. 2. Mild subcutaneous gas in the left inguinal region overlying the inferior pubic ramus. Electronically signed by: Dorethia Molt MD 01/26/2024  10:19 PM EDT RP Workstation: [id number]   DG Chest Portable 1 View Result Date: 01/26/2024 EXAM: 1 VIEW XRAY OF THE CHEST 01/26/2024 10:11:00 PM COMPARISON: 06/16/2023 CLINICAL HISTORY: fall. Elevated BP @ time of imaging (176/104), fell earlier this evening FINDINGS: LUNGS AND PLEURA: Emphysema. No focal pulmonary opacity. No pulmonary edema. No pleural effusion. No pneumothorax. HEART AND MEDIASTINUM: Small Hiatal hernia. No acute abnormality of the cardiac and mediastinal silhouettes. BONES AND SOFT TISSUES: No acute osseous abnormality. IMPRESSION: 1. No acute process. 2. Emphysema 3. hiatal hernia. Electronically signed by: Dorethia Molt MD 01/26/2024 10:16 PM EDT RP Workstation: [id number]    Scheduled Meds:  arformoterol   15 mcg  Nebulization BID   And   umeclidinium bromide   1 puff Inhalation Daily   atorvastatin   20 mg Oral Daily   budesonide   0.25 mg Inhalation BID   buPROPion   300 mg Oral Daily   carvedilol   12.5 mg Oral BID WC   Chlorhexidine  Gluconate Cloth  6 each Topical Q0600   enoxaparin  (LOVENOX ) injection  40 mg Subcutaneous Q24H   prazosin   2 mg Oral QHS   risperiDONE   0.75 mg Oral QHS   Continuous Infusions:   LOS: 0 days   Time spent: 58 mins  Markasia Carrol Vicci, MD How to contact the [facility] Attending or Consulting provider 7A - 7P or covering provider during after hours 7P -7A, for this patient?  Check the care team in [facility] and look for a) attending/consulting [facility] provider listed and b) the TR[facility] team listed Log into [email or web address] to find provider on call.  Locate the [facility] provider you are looking for under [facility] and page to a number that you can be directly reached. If you still have difficulty reaching the provider, please page the [facility] (Director on Call) for the Hospitalists listed on amion for assistance.  01/28/2024, 3:38 PM

## 2024-01-28 NOTE — Progress Notes (Signed)
 Fall note:   Notified by RN of fall out of bed. Was having hallucinations at the time. Unsure if hit his head but c/o headache. Exam head appears atraumatic, no C spine tenderness, motor is full and symmetric, unchanged tremor from admission, Extremities have old skin tears, and bruising R forearm, range without pain.   Plan:  CT head  Fall precautions   Chad Dawson, MD  [facility]

## 2024-01-28 NOTE — Consult Note (Signed)
 [facility] Psychiatric Consult Initial  Patient Name: .Chad Hensley  MRN: [id number]  DOB: 01/25/1951  Consult Order details:  Orders (From admission, onward)     Start     Ordered   01/27/24 1229  CONSULT TO CALL ACT TEAM       Ordering Provider: Vicci Afton CROME, MD  Provider:  (Not yet assigned)  Question:  Reason for Consult?  Answer:  med management for hallucinations   01/27/24 1228             Mode of Visit: Tele-visit Virtual Statement:TELE PSYCHIATRY ATTESTATION & CONSENT As the provider for this telehealth consult, I attest that I verified the patient's identity using two separate identifiers, introduced myself to the patient, provided my credentials, disclosed my location, and performed this encounter via a HIPAA-compliant, real-time, face-to-face, two-way, interactive audio and video platform and with the full consent and agreement of the patient (or guardian as applicable.) Patient physical location: [place]. Telehealth provider physical location: home office in state of [place] .   Video start time: 6:30pm Video end time: 6:50pm    Psychiatry Consult Evaluation  Service Date: January 28, 2024 LOS:  LOS: 0 days  Chief Complaint I'm glad to talk to you  Primary Psychiatric Diagnoses  Hallucinations  Assessment  Chad Hensley is a 73 y.o. male admitted: Medically for 01/26/2024  8:05 PM for after falling at home and being found down by a neighbor. He carries the psychiatric diagnoses of MDD, PTSD and mild cognitive impairment and has a past medical history of  HTN, asthma, COPD, GERD, hyperlipidemia and psorisasis.   His current presentation of polite confusion is most consistent with cognitive impairment. He meets criteria for outpatient follow up based on not being a danger to himself.  Current outpatient psychotropic medications include risperdal  and wellbutrin  and historically he has had a positive response to these medications. He was  compliant with  medications prior to admission as evidenced by patient report. On initial examination, patient is calm cooperative and pleasant. Please see plan below for detailed recommendations.   Diagnoses:  Active Hospital problems: Principal Problem:   Physical deconditioning Active Problems:   Ground-level fall   Tremor   Elevated troponin   Hallucinations, unspecified    Plan   ## Psychiatric Medication Recommendations:  Increase  risperdal  1mg  PO Q HS  ## Medical Decision Making Capacity: Not specifically addressed in this encounter  ## Further Work-up:  -- most recent EKG on 01/26/24 had QtC of 485 -- Pertinent labwork reviewed earlier this admission includes: CBC, CMP, troponin and TSH   ## Disposition:-- There are no psychiatric contraindications to discharge at this time  ## Behavioral / Environmental: -Delirium Precautions: Delirium Interventions for Nursing and Staff: - RN to open blinds every AM. - To Bedside: Glasses, hearing aide, and pt's own shoes. Make available to patients. when possible and encourage use. - Encourage po fluids when appropriate, keep fluids within reach. - OOB to chair with meals. - Passive ROM exercises to all extremities with AM & PM care. - RN to assess orientation to person, time and place QAM and PRN. - Recommend extended visitation hours with familiar family/friends as feasible. - Staff to minimize disturbances at night. Turn off television when pt asleep or when not in use.    ## Safety and Observation Level:  - Based on my clinical evaluation, I estimate the patient to be at no risk of self harm in the current setting. - At  this time, we recommend  routine. This decision is based on my review of the chart including patient's history and current presentation, interview of the patient, mental status examination, and consideration of suicide risk including evaluating suicidal ideation, plan, intent, suicidal or self-harm behaviors, risk factors, and  protective factors. This judgment is based on our ability to directly address suicide risk, implement suicide prevention strategies, and develop a safety plan while the patient is in the clinical setting. Please contact our team if there is a concern that risk level has changed.  CSSR Risk Category:C-SSRS RISK CATEGORY: No Risk  Suicide Risk Assessment: Patient has following modifiable risk factors for suicide: active mental illness (to encompass adhd, tbi, mania, psychosis, trauma reaction), which we are addressing by recommending outpatient follow up with neurology. Patient has following non-modifiable or demographic risk factors for suicide: male gender Patient has the following protective factors against suicide: Access to outpatient mental health care and Supportive family  Thank you for this consult request. Recommendations have been communicated to the primary team.  We will sign off at this time.   Dandra Velardi A Kline, NP       History of Present Illness  Relevant Aspects of [facility] Course:  Admitted on 01/26/2024 for after falling at home and being found down by a neighbor. He carries the psychiatric diagnoses of MDD, PTSD and mild cognitive impairment and has a past medical history of  HTN, asthma, COPD, GERD, hyperlipidemia and psorisasis.    Patient Report:  Chad Hensley, is seen face to face by this provider, consulted with Dr. Merilee; and chart reviewed on 01/28/24.  On evaluation Chad Hensley reports he does have hallucinations at night and he is aware of memory problems.  Patient reports the risperidone  is helpful for reducing the hallucinations, but that he is still seeing things at night.  Per nursing, patient has remained calm and cooperative all day without hallucinations.  Nursing staff reports patient appears to sundown.  Recommend increasing the risperdone to 1mg  PO Q HS.  Recommend implementing precautions for sundowning.  Recommend neurology evaluation for dementia  treatment.    During evaluation Chad Hensley is sitting in a chair in no acute distress.  He is alert & oriented x 2, calm, cooperative and attentive for this assessment.  His mood is euthymic with congruent affect.  He has normal speech, and behavior.  Objectively there is no evidence of psychosis/mania or delusional thinking. Pt does not appear to be responding to internal or external stimuli.  Patient is able to converse coherently, goal directed thoughts, no distractibility, or pre-occupation.  He denies suicidal/self-harm/homicidal ideation, and paranoia; he endorses visual hallucinations at night.  Patient answered question appropriately.    I personally spent a total of 50 minutes in the care of the patient today including preparing to see the patient, getting/reviewing separately obtained history, performing a medically appropriate exam/evaluation, counseling and educating, referring and communicating with other health care professionals, documenting clinical information in the EHR, independently interpreting results, and coordinating care.   Psych ROS:  Depression: denies Anxiety:  denies Mania (lifetime and current): denies Psychosis: (lifetime and current): endorses   Review of Systems  Respiratory:  Positive for shortness of breath.   Neurological:  Positive for weakness.  Psychiatric/Behavioral:  Positive for hallucinations. Negative for memory loss.   All other systems reviewed and are negative.    Psychiatric and Social History  Psychiatric History:  Information collected from patient and chart review  Prev Dx/Sx: MDD, PTSD and mild cognitive impairment  [facility]current[facility] risperdal  and wellbutrin  Previous Med Trials: unknown Therapy: unknown  Prior Psych Hospitalization: UTA Prior Self Harm: UTA Prior Violence: UTA  Family Psych History: UTA Family Hx suicide: UTA  Social History:  Developmental Hx: WNL Educational Hx:  UTA Occupational Hx: retired Armed forces operational officer Hx: denies Living Situation: Lives alone Spiritual Hx: unknown Access to weapons/lethal means: denies   Substance History Alcohol: denies  Illicit drugs: denies Prescription drug abuse: denies Rehab hx: denies  Exam Findings  Physical Exam:  Vital Signs:  Temp:  [97.2 F (36.2 C)-98.9 F (37.2 C)] 98.9 F (37.2 C) (09/26 1759) Pulse Rate:  [82-95] 86 (09/26 1825) Resp:  [20] 20 (09/26 0733) BP: (136-165)/(86-116) 161/116 (09/26 1825) SpO2:  [92 %-97 %] 97 % (09/26 1759) Blood pressure (!) 161/116, pulse 86, temperature 98.9 F (37.2 C), temperature source Oral, resp. rate 20, height 5' 7 (1.702 m), SpO2 97%. Body mass index is 27.35 kg/m.  Physical Exam Vitals and nursing note reviewed.  Eyes:     Pupils: Pupils are equal, round, and reactive to light.  Neurological:     Mental Status: He is alert.  Psychiatric:        Attention and Perception: Attention normal.        Mood and Affect: Mood and affect normal.        Speech: Speech normal.        Behavior: Behavior normal. Behavior is cooperative.        Thought Content: Thought content normal.        Cognition and Memory: Cognition is impaired. Memory is impaired.     Mental Status Exam: General Appearance: Casual  Orientation:  Other:  to self and place  Memory:  Immediate;   Poor Recent;   Poor Remote;   Poor  Concentration:  Concentration: Fair  Recall:  Poor  Attention  Fair  Eye Contact:  Good  Speech:  Clear and Coherent  Language:  Fair  Volume:  Normal  Mood: euthymic  Affect:  Congruent  Thought Process:  Goal Directed  Thought Content:  Logical  Suicidal Thoughts:  No  Homicidal Thoughts:  No  Judgement:  Intact  Insight:  Fair  Psychomotor Activity:  Decreased  Akathisia:  No  Fund of Knowledge:  Fair      Assets:  Housing Leisure Time  Cognition:  Impaired,  Mild  ADL's:  Impaired  AIMS (if indicated):        Other History   These have been  pulled in through the EMR, reviewed, and updated if appropriate.  Family History:  The patient's family history includes Hypertension in his father and mother.  Medical History: Past Medical History:  Diagnosis Date   COPD (chronic obstructive pulmonary disease) (HCC)    Hypertension    PTSD (post-traumatic stress disorder) 05/04/1976    Surgical History: History reviewed. No pertinent surgical history.   Medications:   Current Facility-Administered Medications:    acetaminophen  (TYLENOL ) tablet 1,000 mg, 1,000 mg, Oral, Q6H PRN, Segars, Jonathan, MD, 1,000 mg at 01/27/24 0955   albuterol  (PROVENTIL ) (2.5 MG/3ML) 0.083% nebulizer solution 2.5 mg, 2.5 mg, Nebulization, Q4H PRN, Segars, Jonathan, MD   arformoterol  (BROVANA ) nebulizer solution 15 mcg, 15 mcg, Nebulization, BID, 15 mcg at 01/28/24 0827 **AND** umeclidinium bromide  (INCRUSE ELLIPTA ) 62.5 MCG/ACT 1 puff, 1 puff, Inhalation, Daily, Segars, Jonathan, MD, 1 puff at 01/28/24 0831   atorvastatin  (LIPITOR) tablet 20  mg, 20 mg, Oral, Daily, Segars, Dorn, MD, 20 mg at 01/28/24 9090   budesonide  (PULMICORT ) nebulizer solution 0.25 mg, 0.25 mg, Inhalation, BID, Segars, Dorn, MD, 0.25 mg at 01/28/24 9172   buPROPion  (WELLBUTRIN  XL) 24 hr tablet 300 mg, 300 mg, Oral, Daily, Segars, Jonathan, MD, 300 mg at 01/28/24 0909   carvedilol  (COREG ) tablet 12.5 mg, 12.5 mg, Oral, BID WC, Johnson, Clanford L, MD, 12.5 mg at 01/28/24 1713   Chlorhexidine  Gluconate Cloth 2 % PADS 6 each, 6 each, Topical, Q0600, Johnson, Clanford L, MD, 6 each at 01/28/24 0910   enoxaparin  (LOVENOX ) injection 40 mg, 40 mg, Subcutaneous, Q24H, Segars, Dorn, MD, 40 mg at 01/28/24 0910   hydrALAZINE  (APRESOLINE ) injection 10 mg, 10 mg, Intravenous, Q4H PRN, Vicci, Clanford L, MD, 10 mg at 01/28/24 1832   hydrOXYzine  (ATARAX ) tablet 10 mg, 10 mg, Oral, TID PRN, Segars, Jonathan, MD   melatonin tablet 6 mg, 6 mg, Oral, QHS PRN, Segars, Jonathan, MD    ondansetron  (ZOFRAN ) injection 4 mg, 4 mg, Intravenous, Q6H PRN, Segars, Dorn, MD   polyethylene glycol (MIRALAX  / GLYCOLAX ) packet 17 g, 17 g, Oral, Daily PRN, Segars, Jonathan, MD   prazosin  (MINIPRESS ) capsule 2 mg, 2 mg, Oral, QHS, Segars, Dorn, MD, 2 mg at 01/27/24 2134   risperiDONE  (RISPERDAL ) tablet 0.75 mg, 0.75 mg, Oral, QHS, Segars, Dorn, MD, 0.75 mg at 01/27/24 2137  Allergies: Allergies  Allergen Reactions   Antihistamines, Diphenhydramine-Type Palpitations   Codeine Nausea And Vomiting    Eevie Lapp A Kline, NP

## 2024-01-28 NOTE — Progress Notes (Signed)
 PT Cancellation Note  Patient Details Name: ALP GOLDWATER MRN: [id number] DOB: [birth year]   Cancelled Treatment:    Reason Eval/Treat Not Completed: Patient declined, no reason specified   Lacinda Fass, PT, DPT  01/28/2024, 2:54 PM

## 2024-01-28 NOTE — Plan of Care (Signed)
 Patient has been very tired throughout our interaction today. He has been mobilized to the chair since lunch and has done well otherwise.

## 2024-01-28 NOTE — Progress Notes (Signed)
 Dressings changed on bilateral upper extremity skin tears and IV. I removed gauze and Kerlix from bilateral upper extremities and cleansed the wounds with normal saline. All wound beds are pink with scant amount of odorless, serous, drainage. I applied xeroform to the wound beds on the right arm and applied foam dressings over that. I applied xeroform to the wound bed on the left arm then covered with an ABD pad and wrapped with gauze. All dressings dated and initialed.

## 2024-01-29 DIAGNOSIS — W1830XA Fall on same level, unspecified, initial encounter: Secondary | ICD-10-CM | POA: Diagnosis not present

## 2024-01-29 DIAGNOSIS — R5381 Other malaise: Secondary | ICD-10-CM | POA: Diagnosis not present

## 2024-01-29 DIAGNOSIS — R443 Hallucinations, unspecified: Secondary | ICD-10-CM | POA: Diagnosis not present

## 2024-01-29 DIAGNOSIS — R7989 Other specified abnormal findings of blood chemistry: Secondary | ICD-10-CM | POA: Diagnosis not present

## 2024-01-29 MED ORDER — ALUM & MAG HYDROXIDE-SIMETH 200-200-20 MG/5ML PO SUSP
30.0000 mL | Freq: Four times a day (QID) | ORAL | Status: DC | PRN
  Administered 2024-01-29: 30 mL via ORAL
  Filled 2024-01-29: qty 30

## 2024-01-29 NOTE — Progress Notes (Addendum)
   01/28/24 2037  What Happened  Was fall witnessed? No  Was patient injured? No  Patient found on floor  Found by Staff-comment (Multiple staff members responded to loud fall noise)  Stated prior activity ambulating-unassisted  Provider Notification  Provider Name/Title MD Segars  Date Provider Notified 01/28/24  Time Provider Notified 2048  Method of Notification Page  Notification Reason Fall  Provider response  (Bedside Assessment)  Date of Provider Response 01/28/24  Time of Provider Response 2113  Follow Up  Additional tests Yes-comment (CT Scan)  Progress note created (see row info) Yes  Adult Fall Risk Assessment  Risk Factor Category (scoring not indicated) Fall has occurred during this admission (document High fall risk)  Patient Fall Risk Level High fall risk  Adult Fall Risk Interventions  Required Bundle Interventions *See Row Information* High fall risk - low, moderate, and high requirements implemented  Additional Interventions HeadStart chair sensor (education/return demonstration);Reorient/diversional activities with confused patients;Room near nurses station;Safety Sitter/Safety Rounder;Use of appropriate toileting equipment (bedpan, BSC, etc.)  Screening for Fall Injury Risk (To be completed on HIGH fall risk patients) - Assessing Need for Floor Mats  Risk For Fall Injury- Criteria for Floor Mats Confusion/dementia (+NuDESC, CIWA, TBI, etc.)  Will Implement Floor Mats Yes  Vitals  Temp 98.3 F (36.8 C)  Temp Source Oral  BP (!) 123/93  MAP (mmHg) 102  BP Location Right Arm  BP Method Automatic  Patient Position (if appropriate) Sitting  Pulse Rate (!) 102  Pulse Rate Source Dinamap  Resp 18  Oxygen Therapy  SpO2 94 %  O2 Device Room Air  Pain Assessment  Pain Scale 0-10  Pain Score 4  Neurological  Level of Consciousness Alert

## 2024-01-29 NOTE — Plan of Care (Signed)
°  Problem: Clinical Measurements: Goal: Will remain free from infection Outcome: Progressing   Problem: Nutrition: Goal: Adequate nutrition will be maintained Outcome: Progressing   Problem: Coping: Goal: Level of anxiety will decrease Outcome: Progressing

## 2024-01-29 NOTE — Progress Notes (Signed)
 Physical Therapy Treatment Patient Details Name: Chad Hensley MRN: [id number] DOB: 09-08-1950 Today's Date: 01/29/2024   History of Present Illness Chad Hensley is a 73 y.o. 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 Reports that he was walking around his couch and tripped and fell, bumping things on the way down. And was unable to stand, on the ground for about 5 hours. Reports that he has had frequent falls, about 2 per month over the last 6 months. Reports mechanical falls. Sometimes loses his balance or will get dizzy and falls. No syncopal episode, no chest pain. He reports having uncontrolled hypertension at home, denies any low BP. Sometimes attributes dizziness to severe hypertension SBP ~ 190s. Also notes that has developed new tremor over the past few weeks which has been impacting his functional status. Since the fall feels generally sore, associated skin tears.      Otherwise reports recently having recent cough over the past 4 weeks which has been nonproductive, no other URI symptoms. Gradually improving. Feels dehydrated currently. Denies fever, chills, N/V/D.      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 Patient is aware these hallucinations are not real and reports improved since medication was adjusted.    PT Comments  Patient presents seated in chair (assisted by nursing staff) and agreeable for therapy.  Patient demonstrates fair return for completing BLE ROM/strengthening exercises requiring verbal cueing, tolerated 2 trials of ambulating to doorway and back to bedside with slow labored movement and limited mostly due to c/o fatigue and mild SOB. Patient put back to due to fatigue after having sat in chair for 3-4 hours (nursing staff in room). Patient will benefit from continued skilled physical therapy in  hospital and recommended venue below to increase strength, balance, endurance for safe ADLs and gait.      If plan is discharge home, recommend the following: A lot of help with bathing/dressing/bathroom;A lot of help with walking and/or transfers;Help with stairs or ramp for entrance;Assistance with cooking/housework;Assist for transportation   Can travel by private vehicle     No  Equipment Recommendations  None recommended by PT    Recommendations for Other Services       Precautions / Restrictions Precautions Precautions: Fall Recall of Precautions/Restrictions: Impaired Restrictions Weight Bearing Restrictions Per Provider Order: No     Mobility  Bed Mobility Overal bed mobility: Needs Assistance Bed Mobility: Sit to Supine     Supine to sit: Contact guard     General bed mobility comments: labored movemet, increased time with mild diffiuclty moving legs onto bed    Transfers Overall transfer level: Needs assistance Equipment used: Rolling walker (2 wheels) Transfers: Sit to/from Stand, Bed to chair/wheelchair/BSC Sit to Stand: Min assist   Step pivot transfers: Min assist       General transfer comment: increased BLE strength for completing sit to stands and transfers    Ambulation/Gait Ambulation/Gait assistance: Min assist, Mod assist Gait Distance (Feet): 25 Feet Assistive device: Rolling walker (2 wheels) Gait Pattern/deviations: Decreased step length - right, Decreased step length - left, Decreased stride length, Trunk flexed Gait velocity: decreased     General Gait Details: increased endurance/distance for taking steps with slow unsteady labored movement, no loss of balance and limited mostly due to c/o fatigue and mild SOB, on 3 LPM O2 with  SpO2 at 93%   Stairs             Wheelchair Mobility     Tilt Bed    Modified Rankin (Stroke Patients Only)       Balance Overall balance assessment: Needs assistance Sitting-balance  support: Feet supported, No upper extremity supported Sitting balance-Leahy Scale: Fair Sitting balance - Comments: fair/good seated at EOB   Standing balance support: Bilateral upper extremity supported, During functional activity, Reliant on assistive device for balance Standing balance-Leahy Scale: Poor Standing balance comment: fair/poor using RW                            Communication Communication Communication: No apparent difficulties  Cognition Arousal: Alert Behavior During Therapy: WFL for tasks assessed/performed   PT - Cognitive impairments: No family/caregiver present to determine baseline                         Following commands: Intact      Cueing Cueing Techniques: Verbal cues, Tactile cues  Exercises General Exercises - Lower Extremity Long Arc Quad: Seated, AROM, Strengthening, Both, 10 reps Straight Leg Raises: Seated, AROM, Strengthening, Both, 10 reps Hip Flexion/Marching: Seated, AROM, Strengthening, Both, 10 reps Toe Raises: Seated, AROM, Strengthening, Both, 10 reps    General Comments        Pertinent Vitals/Pain Pain Assessment Pain Assessment: No/denies pain    Home Living                          Prior Function            PT Goals (current goals can now be found in the care plan section) Acute Rehab PT Goals Patient Stated Goal: return home after rehab PT Goal Formulation: With patient Time For Goal Achievement: 02/10/24 Potential to Achieve Goals: Good Progress towards PT goals: Progressing toward goals    Frequency    Min 3X/week      PT Plan      Co-evaluation              AM-PAC PT 6 Clicks Mobility   Outcome Measure  Help needed turning from your back to your side while in a flat bed without using bedrails?: A Little Help needed moving from lying on your back to sitting on the side of a flat bed without using bedrails?: A Little Help needed moving to and from a bed to a  chair (including a wheelchair)?: A Little Help needed standing up from a chair using your arms (e.g., wheelchair or bedside chair)?: A Little Help needed to walk in hospital room?: A Lot Help needed climbing 3-5 steps with a railing? : A Lot 6 Click Score: 16    End of Session Equipment Utilized During Treatment: Oxygen Activity Tolerance: Patient tolerated treatment well;Patient limited by fatigue Patient left: in bed;with call bell/phone within reach;with nursing/sitter in room Nurse Communication: Mobility status PT Visit Diagnosis: Unsteadiness on feet (R26.81);Other abnormalities of gait and mobility (R26.89);Muscle weakness (generalized) (M62.81)     Time: 8961-8897 PT Time Calculation (min) (ACUTE ONLY): 24 min  Charges:    $Gait Training: 8-22 mins $Therapeutic Exercise: 8-22 mins PT General Charges $$ ACUTE PT VISIT: 1 Visit                     12:58 PM, 01/29/24 Lynwood Music, MPT Physical Therapist with   [facility] 336 [phone] office [phone] mobile phone

## 2024-01-29 NOTE — Progress Notes (Signed)
 PROGRESS NOTE   Chad Hensley  FMW:[id number] DOB: [birth year] DOA: 01/26/2024 PCP: [facility]   Chief Complaint  Patient presents with   Fall   Level of care: Med-Surg  Brief Admission History:  73 y.o. 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 Reports that he was walking around his couch and tripped and fell, bumping things on the way down. And was unable to stand, on the ground for about 5 hours. Reports that he has had frequent falls, about 2 per month over the last 6 months. Reports mechanical falls. Sometimes loses his balance or will get dizzy and falls. No syncopal episode, no chest pain. He reports having uncontrolled hypertension at home, denies any low BP. Sometimes attributes dizziness to severe hypertension SBP ~ 190s. Also notes that has developed new tremor over the past few weeks which has been impacting his functional status. Since the fall feels generally sore, associated skin tears.    Otherwise reports recently having recent cough over the past 4 weeks which has been nonproductive, no other URI symptoms. Gradually improving. Feels dehydrated currently. Denies fever, chills, N/V/D.    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 Patient is aware these hallucinations are not real and reports improved since medication was adjusted   Assessment and Plan:  Ground level fall  Deconditioning Hx chronic frequent fall ~ 12 in 6 mo. Reported mechanical fall PTA, on ground x 5 hr unable to stand. Note tremor and occasional myoclonus, question if may be affecting mobility. Labs mostly unremarkable, WBC 16 per below. CK wnl. Imaging including chest x-ray, pelvic x-ray, CT head, C-spine, abdomen pelvis, T/L-spine without acute traumatic injuries.  --pt responded well to IV fluid hydration  -PT/OT  evaluation -- with recommendation for SNF placement  -- Pt is now medically cleared received TTS consultation   SIRS---resolved now  Leukocytosis-- improving   Hx of cough x 4 weeks, without other localizing infectious symptoms., feels dehydrated reports decreased PO. On initial evaluation tachycardic in the low 100s, WBC 16.  No other SIRS criteria.  Heart rate improved with hydration alone.  Suspect hypovolemia mainly.  Unclear source of his leukocytosis, possibly LRI. Imaging unrevealing.  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  --TTS evaluated and increased lamictal dose but reported patient does not meet qualification for inpatient psychiatric placement    Left inguinal hernia containing sigmoid colon without bowel obstruction - monitor 3 cm infrarenal abdominal aortic aneurysm: Ultrasound every 3 year recommended Diverticulosis Cholelithiasis   Diastolic heart failure: Last echo 02/2022 LVEF 60 to 65%.  Without acute exacerbation, suspect dry per above. Hold home lasix  (home 20 mg)   COPD/asthma, CHRF on 2 L O2: Continue on home O2. Continue home lama/laba/ics   Hypertension: resuming home medications now    HLD: Continue home atorvastatin  20 mg   Mood d/o, PTSD: Continue home Risperidone  1 mg nightly, Bupropion  300 mg daily, Prazosin  2 mg at  bedtime, hydroxyzine  prn.     DVT prophylaxis: lovenox   Code Status: Full  Family Communication:  Disposition: pending placement    Consultants:  TTS Procedures:   Antimicrobials:    Subjective: Pt  pleasantly confused    Objective: Vitals:   01/29/24 0200 01/29/24 0819 01/29/24 0906 01/29/24 1302  BP: (!) 154/95 (!) 158/102  130/76  Pulse: 96 (!) 106  78  Resp: 16   18  Temp: 98.4 F (36.9 C)   98 F (36.7 C)  TempSrc: Oral   Oral  SpO2: 96%  92% 98%  Height:        Intake/Output Summary (Last 24 hours) at 01/29/2024 1331 Last data filed at 01/29/2024 1100 Gross per 24 hour  Intake 480 ml  Output 1000 ml  Net -520 ml   There were no vitals filed for this visit. Examination:  General exam: Appears calm and comfortable  Respiratory system: Clear to auscultation. Respiratory effort normal. Cardiovascular system: normal S1 & S2 heard. No JVD, murmurs, rubs, gallops or clicks. No pedal edema. Gastrointestinal system: Abdomen is nondistended, soft and nontender. No organomegaly or masses felt. Normal bowel sounds heard. Central nervous system: Alert and disoriented. No focal neurological deficits. Extremities: Symmetric 5 x 5 power. Skin: No rashes, lesions or ulcers. Psychiatry: Judgement and insight appear diminshed. Mood & affect flat.   Data Reviewed: I have personally reviewed following labs and imaging studies  CBC: Recent Labs  Lab 01/26/24 2120 01/27/24 0335  WBC 15.7* 12.5*  NEUTROABS 12.9*  --   HGB 15.7 15.7  HCT 45.8 46.6  MCV 89.8 90.8  PLT 263 242    Basic Metabolic Panel: Recent Labs  Lab 01/26/24 2120 01/27/24 0335  NA 142 142  K 4.1 3.9  CL 105 106  CO2 25 24  GLUCOSE 99 114*  BUN 13 12  CREATININE 1.11 0.95  CALCIUM  9.4 9.2  MG  --  2.3  PHOS  --  2.8    CBG: Recent Labs  Lab 01/26/24 2130 01/28/24 0922  GLUCAP 93 162*    Recent Results (from the past 240 hours)  Resp panel by RT-PCR (RSV, Flu A&B, Covid) Anterior Nasal Swab     Status: None   Collection Time: 01/26/24  9:27 PM   Specimen: Anterior Nasal Swab  Result Value Ref Range Status   SARS Coronavirus 2 by RT PCR NEGATIVE NEGATIVE Final    Comment:  (NOTE) SARS-CoV-2 target nucleic acids are NOT DETECTED.  The SARS-CoV-2 RNA is generally detectable in upper respiratory specimens during the acute phase of infection. The lowest concentration of SARS-CoV-2 viral copies this assay can detect is 138 copies/mL. A negative result does not preclude SARS-Cov-2 infection and should not be used as the sole basis for treatment or other patient management decisions. A negative result may occur with  improper specimen collection/handling, submission of specimen other than nasopharyngeal swab, presence of viral mutation(s) within the areas targeted by this assay, and inadequate number of viral copies(<138 copies/mL). A negative result must be combined with clinical observations, patient history, and epidemiological information. The expected result is Negative.  Fact Sheet for Patients:  BloggerCourse.com  Fact Sheet for Healthcare Providers:  SeriousBroker.it  This test is no t yet approved or cleared by the [place]  FDA and  has been authorized for detection and/or diagnosis of SARS-CoV-2 by FDA under an Emergency Use Authorization (EUA). This EUA will remain  in effect (meaning this test can be used) for the duration  of the COVID-19 declaration under Section 564(b)(1) of the Act, 21 U.S.C.section 360bbb-3(b)(1), unless the authorization is terminated  or revoked sooner.       Influenza A by PCR NEGATIVE NEGATIVE Final   Influenza B by PCR NEGATIVE NEGATIVE Final    Comment: (NOTE) The Xpert Xpress SARS-CoV-2/FLU/RSV plus assay is intended as an aid in the diagnosis of influenza from Nasopharyngeal swab specimens and should not be used as a sole basis for treatment. Nasal washings and aspirates are unacceptable for Xpert Xpress SARS-CoV-2/FLU/RSV testing.  Fact Sheet for Patients: BloggerCourse.com  Fact Sheet for Healthcare  Providers: SeriousBroker.it  This test is not yet approved or cleared by the [place]  FDA and has been authorized for detection and/or diagnosis of SARS-CoV-2 by FDA under an Emergency Use Authorization (EUA). This EUA will remain in effect (meaning this test can be used) for the duration of the COVID-19 declaration under Section 564(b)(1) of the Act, 21 U.S.C. section 360bbb-3(b)(1), unless the authorization is terminated or revoked.     Resp Syncytial Virus by PCR NEGATIVE NEGATIVE Final    Comment: (NOTE) Fact Sheet for Patients: BloggerCourse.com  Fact Sheet for Healthcare Providers: SeriousBroker.it  This test is not yet approved or cleared by the [place]  FDA and has been authorized for detection and/or diagnosis of SARS-CoV-2 by FDA under an Emergency Use Authorization (EUA). This EUA will remain in effect (meaning this test can be used) for the duration of the COVID-19 declaration under Section 564(b)(1) of the Act, 21 U.S.C. section 360bbb-3(b)(1), unless the authorization is terminated or revoked.  Performed at [facility], [address], [place], [place] [address]   Blood Culture (routine x 2)     Status: None (Preliminary result)   Collection Time: 01/26/24 10:55 PM   Specimen: BLOOD  Result Value Ref Range Status   Specimen Description BLOOD BLOOD LEFT ARM  Final   Special Requests   Final    BOTTLES DRAWN AEROBIC AND ANAEROBIC Blood Culture adequate volume   Culture   Final    NO GROWTH 3 DAYS Performed at [facility], [address] [place], [place] [address]    Report Status PENDING  Incomplete  Blood Culture (routine x 2)     Status: None (Preliminary result)   Collection Time: 01/26/24 10:58 PM   Specimen: BLOOD  Result Value Ref Range Status   Specimen Description BLOOD BLOOD LEFT HAND  Final   Special Requests   Final    BOTTLES DRAWN AEROBIC AND ANAEROBIC Blood  Culture adequate volume   Culture   Final    NO GROWTH 3 DAYS Performed at [facility], [address] [place], [place] [address]    Report Status PENDING  Incomplete  Respiratory (~20 pathogens) panel by PCR     Status: None   Collection Time: 01/27/24  2:52 AM   Specimen: Nasopharyngeal Swab; Respiratory  Result Value Ref Range Status   Adenovirus NOT DETECTED NOT DETECTED Final   Coronavirus 229E NOT DETECTED NOT DETECTED Final    Comment: (NOTE) The Coronavirus on the Respiratory Panel, DOES NOT test for the novel  Coronavirus (2019 nCoV)    Coronavirus HKU1 NOT DETECTED NOT DETECTED Final   Coronavirus NL63 NOT DETECTED NOT DETECTED Final   Coronavirus OC43 NOT DETECTED NOT DETECTED Final   Metapneumovirus NOT DETECTED NOT DETECTED Final   Rhinovirus / Enterovirus NOT DETECTED NOT DETECTED Final   Influenza A NOT DETECTED NOT DETECTED Final   Influenza B NOT DETECTED  NOT DETECTED Final   Parainfluenza Virus 1 NOT DETECTED NOT DETECTED Final   Parainfluenza Virus 2 NOT DETECTED NOT DETECTED Final   Parainfluenza Virus 3 NOT DETECTED NOT DETECTED Final   Parainfluenza Virus 4 NOT DETECTED NOT DETECTED Final   Respiratory Syncytial Virus NOT DETECTED NOT DETECTED Final   Bordetella pertussis NOT DETECTED NOT DETECTED Final   Bordetella Parapertussis NOT DETECTED NOT DETECTED Final   Chlamydophila pneumoniae NOT DETECTED NOT DETECTED Final   Mycoplasma pneumoniae NOT DETECTED NOT DETECTED Final    Comment: Performed at [facility], [address], [place], [place] [address]     Radiology Studies: CT HEAD WO CONTRAST ( ) Result Date: 01/29/2024 CLINICAL DATA:  Initial evaluation for acute trauma, unwitnessed fall. EXAM: CT HEAD WITHOUT CONTRAST TECHNIQUE: Contiguous axial images were obtained from the base of the skull through the vertex without intravenous contrast. RADIATION DOSE REDUCTION: This exam was performed according to the departmental dose-optimization  program which includes automated exposure control, adjustment of the mA and/or kV according to patient size and/or use of iterative reconstruction technique. COMPARISON:  CT from 01/26/2024. FINDINGS: Brain: Mild age-related cerebral atrophy. Patchy hypodensity involving the supratentorial cerebral white matter, consistent with chronic small vessel ischemic disease. No acute intracranial hemorrhage. No acute large vessel territory infarct. No mass lesion or midline shift. No hydrocephalus or extra-axial fluid collection. Vascular: No abnormal hyperdense vessel. Scattered vascular calcifications noted within the carotid siphons. Skull: Scalp soft tissues demonstrate no acute finding. Calvarium intact. Sinuses/Orbits: Globes orbital soft tissues within normal limits. Paranasal sinuses are largely clear. No mastoid effusion. Other: None. IMPRESSION: 1. No acute intracranial abnormality. 2. Mild age-related cerebral atrophy with chronic small vessel ischemic disease. Electronically Signed   By: Morene Hoard M.D.   On: 01/29/2024 00:04    Scheduled Meds:  arformoterol   15 mcg Nebulization BID   And   umeclidinium bromide   1 puff Inhalation Daily   atorvastatin   20 mg Oral Daily   budesonide   0.25 mg Inhalation BID   buPROPion   300 mg Oral Daily   carvedilol   12.5 mg Oral BID WC   Chlorhexidine  Gluconate Cloth  6 each Topical Q0600   enoxaparin  (LOVENOX ) injection  40 mg Subcutaneous Q24H   prazosin   2 mg Oral QHS   risperiDONE   1 mg Oral QHS   Continuous Infusions:   LOS: 0 days   Time spent: 55 mins  Talen Poser Vic[place], MD How to contact the [facility] Attending or Consulting provider 7A - 7P or covering provider during after hours 7P -7A, for this patient?  Check the care team in [facility] and look for a) attending/consulting TR[facility] provider listed and b) the TRH team listed Log into [email or web address] to find provider on call.  Locate the TR[facility] provider you are looking for under [facility] and page to  a number that you can be directly reached. If you still have difficulty reaching the provider, please page the [facility] (Director on Call) for the Hospitalists listed on amion for assistance.  01/29/2024, 1:31 PM

## 2024-01-30 DIAGNOSIS — W1830XA Fall on same level, unspecified, initial encounter: Secondary | ICD-10-CM | POA: Diagnosis not present

## 2024-01-30 DIAGNOSIS — R5381 Other malaise: Secondary | ICD-10-CM | POA: Diagnosis not present

## 2024-01-30 DIAGNOSIS — R7989 Other specified abnormal findings of blood chemistry: Secondary | ICD-10-CM | POA: Diagnosis not present

## 2024-01-30 DIAGNOSIS — R443 Hallucinations, unspecified: Secondary | ICD-10-CM | POA: Diagnosis not present

## 2024-01-30 LAB — CBC WITH DIFFERENTIAL/PLATELET
Abs Immature Granulocytes: 0.04 K/uL (ref 0.00–0.07)
Basophils Absolute: 0 K/uL (ref 0.0–0.1)
Basophils Relative: 0 %
Eosinophils Absolute: 0.3 K/uL (ref 0.0–0.5)
Eosinophils Relative: 4 %
HCT: 40.9 % (ref 39.0–52.0)
Hemoglobin: 13.5 g/dL (ref 13.0–17.0)
Immature Granulocytes: 1 %
Lymphocytes Relative: 20 %
Lymphs Abs: 1.6 K/uL (ref 0.7–4.0)
MCH: 30.5 pg (ref 26.0–34.0)
MCHC: 33 g/dL (ref 30.0–36.0)
MCV: 92.3 fL (ref 80.0–100.0)
Monocytes Absolute: 1 K/uL (ref 0.1–1.0)
Monocytes Relative: 12 %
Neutro Abs: 5.3 K/uL (ref 1.7–7.7)
Neutrophils Relative %: 63 %
Platelets: 229 K/uL (ref 150–400)
RBC: 4.43 MIL/uL (ref 4.22–5.81)
RDW: 14.1 % (ref 11.5–15.5)
WBC: 8.3 K/uL (ref 4.0–10.5)
nRBC: 0 % (ref 0.0–0.2)

## 2024-01-30 LAB — SEDIMENTATION RATE: Sed Rate: 5 mm/h (ref 0–20)

## 2024-01-30 LAB — FOLATE: Folate: 12.3 ng/mL

## 2024-01-30 LAB — VITAMIN B12: Vitamin B-12: 263 pg/mL (ref 180–914)

## 2024-01-30 NOTE — Progress Notes (Signed)
 PROGRESS NOTE   Chad Hensley  FMW:[id number] DOB: 26-May-1950 DOA: 01/26/2024 PCP: [facility]   Chief Complaint  Patient presents with   Fall   Level of care: Med-Surg  Brief Admission History:  73 y.o. 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 Reports that he was walking around his couch and tripped and fell, bumping things on the way down. And was unable to stand, on the ground for about 5 hours. Reports that he has had frequent falls, about 2 per month over the last 6 months. Reports mechanical falls. Sometimes loses his balance or will get dizzy and falls. No syncopal episode, no chest pain. He reports having uncontrolled hypertension at home, denies any low BP. Sometimes attributes dizziness to severe hypertension SBP ~ 190s. Also notes that has developed new tremor over the past few weeks which has been impacting his functional status. Since the fall feels generally sore, associated skin tears.    Otherwise reports recently having recent cough over the past 4 weeks which has been nonproductive, no other URI symptoms. Gradually improving. Feels dehydrated currently. Denies fever, chills, N/V/D.    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 Patient is aware these hallucinations are not real and reports improved since medication was adjusted   Assessment and Plan:  Ground level fall  Deconditioning Hx chronic frequent fall ~ 12 in 6 mo. Reported mechanical fall PTA, on ground x 5 hr unable to stand. Note tremor and occasional myoclonus, question if may be affecting mobility. Labs mostly unremarkable, WBC 16 per below. CK wnl. Imaging including chest x-ray, pelvic x-ray, CT head, C-spine, abdomen pelvis, T/L-spine without acute traumatic injuries.  --pt responded well to IV fluid hydration  -PT/OT  evaluation -- with recommendation for SNF placement  -- Pt is now medically cleared received TTS consultation   SIRS---resolved now  Leukocytosis-- RESOLVED  Hx of cough x 4 weeks, without other localizing infectious symptoms., feels dehydrated reports decreased PO. On initial evaluation tachycardic in the low 100s, WBC 16.  No other SIRS criteria.  Heart rate improved with hydration alone.  Suspect hypovolemia mainly.  Unclear source of his leukocytosis, possibly LRI. Imaging unrevealing.  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  --TTS evaluated and increased risperdal  dose but reported patient does not meet qualification for inpatient psychiatric placement    Left inguinal hernia containing sigmoid colon without bowel obstruction - monitor 3 cm infrarenal abdominal aortic aneurysm: Ultrasound every 3 year recommended Diverticulosis - stable  Cholelithiasis - asymptomatic; tolerating diet well    Diastolic heart failure: Last echo 02/2022 LVEF 60 to 65%.  Without acute exacerbation, suspect dry per above. Hold home lasix  (home 20 mg)   COPD/asthma, CHRF on 2 L O2: Continue on home O2. Continue home lama/laba/ics   Hypertension: resuming home medications now    HLD: Continue home atorvastatin  20 mg   Mood d/o, PTSD: Continue home Risperidone  1 mg nightly,  Bupropion  300 mg daily, Prazosin  2 mg at bedtime, hydroxyzine  prn.     DVT prophylaxis: lovenox   Code Status: Full  Family Communication: call to daughter Wilbert 9/28 Disposition: pending placement     Consultants:  TTS Procedures:   Antimicrobials:    Subjective: Pt says he feels well today.     Objective: Vitals:   01/29/24 2007 01/30/24 0332 01/30/24 1031 01/30/24 1322  BP: 137/81 132/75  109/72  Pulse: 62 66  65  Resp: 18 19  20   Temp: 98.1 F (36.7 C) 97.6 F (36.4 C)  97.6 F (36.4 C)  TempSrc: Oral   Oral  SpO2: 97% 95% 96% 96%  Height:        Intake/Output Summary (Last 24 hours) at 01/30/2024 1346 Last data filed at 01/30/2024 1224 Gross per 24 hour  Intake 240 ml  Output 550 ml  Net -310 ml   There were no vitals filed for this visit. Examination:  General exam: Appears calm and comfortable  Respiratory system: Clear to auscultation. Respiratory effort normal. Cardiovascular system: normal S1 & S2 heard. No JVD, murmurs, rubs, gallops or clicks. No pedal edema. Gastrointestinal system: Abdomen is nondistended, soft and nontender. No organomegaly or masses felt. Normal bowel sounds heard. Central nervous system: Alert and disoriented. No focal neurological deficits. Extremities: Symmetric 5 x 5 power. Skin: No rashes, lesions or ulcers. Psychiatry: Judgement and insight appear diminshed. Mood & affect flat.   Data Reviewed: I have personally reviewed following labs and imaging studies  CBC: Recent Labs  Lab 01/26/24 2120 01/27/24 0335 01/30/24 0433  WBC 15.7* 12.5* 8.3  NEUTROABS 12.9*  --  5.3  HGB 15.7 15.7 13.5  HCT 45.8 46.6 40.9  MCV 89.8 90.8 92.3  PLT 263 242 229    Basic Metabolic Panel: Recent Labs  Lab 01/26/24 2120 01/27/24 0335  NA 142 142  K 4.1 3.9  CL 105 106  CO2 25 24  GLUCOSE 99 114*  BUN 13 12  CREATININE 1.11 0.95  CALCIUM  9.4 9.2  MG  --  2.3  PHOS  --  2.8    CBG: Recent Labs  Lab 01/26/24 2130 01/28/24 0922  GLUCAP 93 162*    Recent Results (from the past 240 hours)  Resp panel by RT-PCR (RSV, Flu A&B, Covid) Anterior Nasal Swab     Status: None   Collection Time: 01/26/24  9:27 PM   Specimen:  Anterior Nasal Swab  Result Value Ref Range Status   SARS Coronavirus 2 by RT PCR NEGATIVE NEGATIVE Final    Comment: (NOTE) SARS-CoV-2 target nucleic acids are NOT DETECTED.  The SARS-CoV-2 RNA is generally detectable in upper respiratory specimens during the acute phase of infection. The lowest concentration of SARS-CoV-2 viral copies this assay can detect is 138 copies/mL. A negative result does not preclude SARS-Cov-2 infection and should not be used as the sole basis for treatment or other patient management decisions. A negative result may occur with  improper specimen collection/handling, submission of specimen other than nasopharyngeal swab, presence of viral mutation(s) within the areas targeted by this assay, and inadequate number of viral copies(<138 copies/mL). A negative result must be combined with clinical observations, patient history, and epidemiological information. The expected result is Negative.  Fact Sheet for Patients:  BloggerCourse.com  Fact Sheet for Healthcare Providers:  SeriousBroker.it  This test is no t yet approved or cleared by the [place]  FDA and  has been authorized for detection and/or diagnosis of SARS-CoV-2  by FDA under an Emergency Use Authorization (EUA). This EUA will remain  in effect (meaning this test can be used) for the duration of the COVID-19 declaration under Section 564(b)(1) of the Act, 21 U.S.C.section 360bbb-3(b)(1), unless the authorization is terminated  or revoked sooner.       Influenza A by PCR NEGATIVE NEGATIVE Final   Influenza B by PCR NEGATIVE NEGATIVE Final    Comment: (NOTE) The Xpert Xpress SARS-CoV-2/FLU/RSV plus assay is intended as an aid in the diagnosis of influenza from Nasopharyngeal swab specimens and should not be used as a sole basis for treatment. Nasal washings and aspirates are unacceptable for Xpert Xpress SARS-CoV-2/FLU/RSV testing.  Fact  Sheet for Patients: BloggerCourse.com  Fact Sheet for Healthcare Providers: SeriousBroker.it  This test is not yet approved or cleared by the [place]  FDA and has been authorized for detection and/or diagnosis of SARS-CoV-2 by FDA under an Emergency Use Authorization (EUA). This EUA will remain in effect (meaning this test can be used) for the duration of the COVID-19 declaration under Section 564(b)(1) of the Act, 21 U.S.C. section 360bbb-3(b)(1), unless the authorization is terminated or revoked.     Resp Syncytial Virus by PCR NEGATIVE NEGATIVE Final    Comment: (NOTE) Fact Sheet for Patients: BloggerCourse.com  Fact Sheet for Healthcare Providers: SeriousBroker.it  This test is not yet approved or cleared by the [place]  FDA and has been authorized for detection and/or diagnosis of SARS-CoV-2 by FDA under an Emergency Use Authorization (EUA). This EUA will remain in effect (meaning this test can be used) for the duration of the COVID-19 declaration under Section 564(b)(1) of the Act, 21 U.S.C. section 360bbb-3(b)(1), unless the authorization is terminated or revoked.  Performed at [facility], [address], [place], [place] [address]   Blood Culture (routine x 2)     Status: None (Preliminary result)   Collection Time: 01/26/24 10:55 PM   Specimen: BLOOD  Result Value Ref Range Status   Specimen Description BLOOD BLOOD LEFT ARM  Final   Special Requests   Final    BOTTLES DRAWN AEROBIC AND ANAEROBIC Blood Culture adequate volume   Culture   Final    NO GROWTH 4 DAYS Performed at [facility], [address] [place], [place] [address]    Report Status PENDING  Incomplete  Blood Culture (routine x 2)     Status: None (Preliminary result)   Collection Time: 01/26/24 10:58 PM   Specimen: BLOOD  Result Value Ref Range Status   Specimen Description BLOOD BLOOD  LEFT HAND  Final   Special Requests   Final    BOTTLES DRAWN AEROBIC AND ANAEROBIC Blood Culture adequate volume   Culture   Final    NO GROWTH 4 DAYS Performed at [facility], [address], [place], [place] [address]    Report Status PENDING  Incomplete  Respiratory (~20 pathogens) panel by PCR     Status: None   Collection Time: 01/27/24  2:52 AM   Specimen: Nasopharyngeal Swab; Respiratory  Result Value Ref Range Status   Adenovirus NOT DETECTED NOT DETECTED Final   Coronavirus 229E NOT DETECTED NOT DETECTED Final    Comment: (NOTE) The Coronavirus on the Respiratory Panel, DOES NOT test for the novel  Coronavirus (2019 nCoV)    Coronavirus HKU1 NOT DETECTED NOT DETECTED Final   Coronavirus NL63 NOT DETECTED NOT DETECTED Final   Coronavirus OC43 NOT DETECTED NOT DETECTED Final   Metapneumovirus NOT DETECTED NOT DETECTED Final  Rhinovirus / Enterovirus NOT DETECTED NOT DETECTED Final   Influenza A NOT DETECTED NOT DETECTED Final   Influenza B NOT DETECTED NOT DETECTED Final   Parainfluenza Virus 1 NOT DETECTED NOT DETECTED Final   Parainfluenza Virus 2 NOT DETECTED NOT DETECTED Final   Parainfluenza Virus 3 NOT DETECTED NOT DETECTED Final   Parainfluenza Virus 4 NOT DETECTED NOT DETECTED Final   Respiratory Syncytial Virus NOT DETECTED NOT DETECTED Final   Bordetella pertussis NOT DETECTED NOT DETECTED Final   Bordetella Parapertussis NOT DETECTED NOT DETECTED Final   Chlamydophila pneumoniae NOT DETECTED NOT DETECTED Final   Mycoplasma pneumoniae NOT DETECTED NOT DETECTED Final    Comment: Performed at [facility], [address], [place], [place] [address]     Radiology Studies: CT HEAD WO CONTRAST ( ) Result Date: 01/29/2024 CLINICAL DATA:  Initial evaluation for acute trauma, unwitnessed fall. EXAM: CT HEAD WITHOUT CONTRAST TECHNIQUE: Contiguous axial images were obtained from the base of the skull through the vertex without intravenous contrast. RADIATION  DOSE REDUCTION: This exam was performed according to the departmental dose-optimization program which includes automated exposure control, adjustment of the mA and/or kV according to patient size and/or use of iterative reconstruction technique. COMPARISON:  CT from 01/26/2024. FINDINGS: Brain: Mild age-related cerebral atrophy. Patchy hypodensity involving the supratentorial cerebral white matter, consistent with chronic small vessel ischemic disease. No acute intracranial hemorrhage. No acute large vessel territory infarct. No mass lesion or midline shift. No hydrocephalus or extra-axial fluid collection. Vascular: No abnormal hyperdense vessel. Scattered vascular calcifications noted within the carotid siphons. Skull: Scalp soft tissues demonstrate no acute finding. Calvarium intact. Sinuses/Orbits: Globes orbital soft tissues within normal limits. Paranasal sinuses are largely clear. No mastoid effusion. Other: None. IMPRESSION: 1. No acute intracranial abnormality. 2. Mild age-related cerebral atrophy with chronic small vessel ischemic disease. Electronically Signed   By: Morene Hoard M.D.   On: 01/29/2024 00:04    Scheduled Meds:  arformoterol   15 mcg Nebulization BID   And   umeclidinium bromide   1 puff Inhalation Daily   atorvastatin   20 mg Oral Daily   budesonide   0.25 mg Inhalation BID   buPROPion   300 mg Oral Daily   carvedilol   12.5 mg Oral BID WC   Chlorhexidine  Gluconate Cloth  6 each Topical Q0600   enoxaparin  (LOVENOX ) injection  40 mg Subcutaneous Q24H   prazosin   2 mg Oral QHS   risperiDONE   1 mg Oral QHS   Continuous Infusions:   LOS: 0 days   Time spent: 50 mins  Zaelynn Fuchs Vicci, MD How to contact the [facility] Attending or Consulting provider 7A - 7P or covering provider during after hours 7P -7A, for this patient?  Check the care team in [facility] and look for a) attending/consulting TRH provider listed and b) the TRH team listed Log into [email or web address] to find provider on  call.  Locate the TRH provider you are looking for under [facility] and page to a number that you can be directly reached. If you still have difficulty reaching the provider, please page the [facility] (Director on Call) for the Hospitalists listed on amion for assistance.  01/30/2024, 1:46 PM

## 2024-01-30 NOTE — TOC Progression Note (Signed)
 Transition of Care [facility]) - Progression Note    Patient Details  Name: Chad Hensley MRN: [id number] Date of Birth: Oct 01, 1950  Transition [facility]) CM/SW Contact  Lorraine LILLETTE Fenton, LCSW Phone Number: 01/30/2024, 9:42 AM  Clinical Narrative:    CSW reached out to Debbie at [facility] to ask if there was any update on [facility]. Pt medically ready for DC.   ICM following.    Expected Discharge Plan: Skilled Nursing Facility Barriers to Discharge: Insurance Authorization               Expected Discharge Plan and Services In-house Referral: Clinical Social Work Discharge Planning Services: NA Post Acute Care Choice: Skilled Nursing Facility Living arrangements for the past 2 months: Single Family Home                 DME Arranged: N/A DME Agency: NA                   Social Drivers of Health (SDOH) Interventions SDOH Screenings   Food Insecurity: No Food Insecurity (01/27/2024)  Housing: Low Risk  (01/27/2024)  Transportation Needs: No Transportation Needs (01/27/2024)  Utilities: Not At Risk (01/27/2024)  Depression (PHQ2-9): High Risk (10/12/2023)  Social Connections: Unknown (01/27/2024)  Tobacco Use: Medium Risk (01/26/2024)    Readmission Risk Interventions     No data to display

## 2024-01-30 NOTE — Plan of Care (Signed)

## 2024-01-31 DIAGNOSIS — W1830XA Fall on same level, unspecified, initial encounter: Secondary | ICD-10-CM | POA: Diagnosis not present

## 2024-01-31 DIAGNOSIS — R443 Hallucinations, unspecified: Secondary | ICD-10-CM | POA: Diagnosis not present

## 2024-01-31 DIAGNOSIS — R5381 Other malaise: Secondary | ICD-10-CM | POA: Diagnosis not present

## 2024-01-31 LAB — RPR: RPR Ser Ql: NONREACTIVE

## 2024-01-31 LAB — CULTURE, BLOOD (ROUTINE X 2)
Culture: NO GROWTH
Culture: NO GROWTH
Special Requests: ADEQUATE
Special Requests: ADEQUATE

## 2024-01-31 MED ORDER — FUROSEMIDE 20 MG PO TABS: 20.0000 mg | ORAL_TABLET | Freq: Every day | ORAL | Status: AC | PRN

## 2024-01-31 MED ORDER — POLYETHYLENE GLYCOL 3350 17 G PO PACK: 17.0000 g | PACK | ORAL | Status: AC

## 2024-01-31 MED ORDER — RISPERIDONE 1 MG PO TABS: 1.0000 mg | ORAL_TABLET | Freq: Every day | ORAL | Status: AC

## 2024-01-31 MED ORDER — POTASSIUM CHLORIDE ER 10 MEQ PO TBCR: 10.0000 meq | EXTENDED_RELEASE_TABLET | Freq: Every day | ORAL | Status: AC | PRN

## 2024-01-31 MED ORDER — ISOSORBIDE MONONITRATE ER 30 MG PO TB24: 30.0000 mg | ORAL_TABLET | Freq: Every day | ORAL | Status: AC

## 2024-01-31 NOTE — Progress Notes (Signed)
 Mobility Specialist Progress Note:    01/31/24 0850  Mobility  Activity Ambulated with assistance  Level of Assistance Contact guard assist, steadying assist  Assistive Device Front wheel walker  Distance Ambulated (ft) 15 ft  Range of Motion/Exercises Active;All extremities  Activity Response Tolerated well  Mobility Referral Yes  Mobility visit 1 Mobility  Mobility Specialist Start Time (ACUTE ONLY) 0850  Mobility Specialist Stop Time (ACUTE ONLY) 0910  Mobility Specialist Time Calculation (min) (ACUTE ONLY) 20 min   Pt received in bed, requesting assistance to bathroom. Required CGA to stand and ambulate with RW. Tolerated well, a little confused. Left in chair, alarm on. Call bell in reach, all needs met.   Shiva Karis Mobility Specialist Please contact via Special educational needs teacher or  Rehab office at [phone]

## 2024-01-31 NOTE — TOC Transition Note (Signed)
 Transition of Care [facility]) - Discharge Note   Patient Details  Name: Chad Hensley MRN: [id number] Date of Birth: [birth year]  Transition of Care [facility]) CM/SW Contact:  Lucie Lunger, LCSWA Phone Number: 01/31/2024, 11:27 AM  Clinical Narrative:    CSW updated by [facility] LIEN that they have approved patient for SNF placement at the [facility]. CSW updated pts daughter of this plan vs [facility], she states she is understanding they approved him for [facility] and that is fine with her. CSW [facility] and provided daughters contact info. D/C summary and psych notes faxed to facility. RN provided with room and report numbers. Med necessity printed to floor for RN. EMS called for transport. TOC signing off.   Final next level of care: Skilled Nursing Facility Barriers to Discharge: Barriers Resolved   Patient Goals and CMS Choice Patient states their goals for this hospitalization and ongoing recovery are:: go to SNF at [facility].gov Compare Post Acute Care list provided to:: Patient Choice offered to / list presented to : Patient, Adult Children [place] ownership interest in [facility].provided to:: Patient    Discharge Placement                Patient to be transferred to facility by: EMS Name of family member notified: Daughter Patient and family notified of of transfer: 01/31/24  Discharge Plan and Services Additional resources added to the After Visit Summary for   In-house Referral: Clinical Social Work Discharge Planning Services: NA Post Acute Care Choice: Skilled Nursing Facility          DME Arranged: N/A DME Agency: NA                  Social Drivers of Health (SDOH) Interventions SDOH Screenings   Food Insecurity: No Food Insecurity (01/27/2024)  Housing: Low Risk  (01/27/2024)  Transportation Needs: No Transportation Needs (01/27/2024)  Utilities: Not At Risk (01/27/2024)  Depression (PHQ2-9): High Risk (10/12/2023)  Social  Connections: Unknown (01/27/2024)  Tobacco Use: Medium Risk (01/26/2024)     Readmission Risk Interventions     No data to display

## 2024-01-31 NOTE — Plan of Care (Signed)

## 2024-01-31 NOTE — Discharge Summary (Signed)
 Physician Discharge Summary  Chad Hensley FMW:[id number] DOB: 02-23-1951 DOA: 01/26/2024  PCP: [facility]  Admit date: 01/26/2024 Discharge date: 01/31/2024  Admitted From:  Home  Disposition: SNF with [facility]  Recommendations for Outpatient Follow-up:  Follow up with PCP in 1-2 weeks Please obtain BMP/CBC in 1 week Fall precautions recommended   Home Health:  SNF  Discharge Condition: STABLE   CODE STATUS: FULL DIET: dysphagia 3    Brief Hospitalization Summary: Please see all hospital notes, images, labs for full details of the hospitalization. Admission provider HPI:  73 y.o. 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 Reports that he was walking around his couch and tripped and fell, bumping things on the way down. And was unable to stand, on the ground for about 5 hours. Reports that he has had frequent falls, about 2 per month over the last 6 months. Reports mechanical falls. Sometimes loses his balance or will get dizzy and falls. No syncopal episode, no chest pain. He reports having uncontrolled hypertension at home, denies any low BP. Sometimes attributes dizziness to severe hypertension SBP ~ 190s. Also notes that has developed new tremor over the past few weeks which has been impacting his functional status. Since the fall feels generally sore, associated skin tears.    Otherwise reports recently having recent cough over the past 4 weeks which has been nonproductive, no other URI symptoms. Gradually improving. Feels dehydrated currently. Denies fever, chills, N/V/D.    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 Patient is aware these hallucinations are not real and reports improved since medication was adjusted   Hospital Course by listed problems addressed  Ground level fall   Deconditioning Hx chronic frequent fall ~ 12 in 6 mo. Reported mechanical fall PTA, on ground x 5 hr unable to stand. Note tremor and occasional myoclonus, question if may be affecting mobility. Labs mostly unremarkable, WBC 16 per below. CK wnl. Imaging including chest x-ray, pelvic x-ray, CT head, C-spine, abdomen pelvis, T/L-spine without acute traumatic injuries.  --pt responded well to IV fluid hydration  -PT/OT evaluation -- with recommendation for SNF placement  -- Pt is now medically cleared received TTS consultation--which was completed    SIRS---resolved now  Leukocytosis-- RESOLVED  Hx of cough x 4 weeks, without other localizing infectious symptoms., feels dehydrated reports decreased PO. On initial evaluation tachycardic in the low 100s, WBC 16.  No other SIRS criteria.  Heart rate improved with hydration alone.  Suspect hypovolemia mainly.  Unclear source of his leukocytosis, possibly LRI. Imaging unrevealing.  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  --TTS evaluated and increased risperdal  dose but reported patient does not meet qualification for inpatient psychiatric placement    Left inguinal hernia containing sigmoid colon without bowel obstruction - monitor 3 cm infrarenal abdominal aortic aneurysm: Ultrasound every 3 year recommended Diverticulosis - stable  Cholelithiasis - asymptomatic; tolerating diet well    Diastolic heart  failure: Last echo 02/2022 LVEF 60 to 65%.  Without acute exacerbation, suspect dry per above. changed home lasix   (home 20 mg) daily PRN edema as he has had poor oral intake    COPD/asthma, CHRF on 2 L O2: Continue on home O2. Continue home lama/laba/ics    Hypertension: resuming home medications now     HLD: Continue home atorvastatin  20 mg    Mood d/o, PTSD: Continue home Risperidone  1 mg nightly, Bupropion  300 mg daily, Prazosin  2 mg at bedtime, hydroxyzine  prn.    Discharge Diagnoses:  Principal Problem:   Physical deconditioning Active Problems:   Ground-level fall   Tremor   Elevated troponin   Hallucinations, unspecified   Discharge Instructions:  Allergies as of 01/31/2024       Reactions   Antihistamines, Diphenhydramine-type Palpitations   Codeine Nausea And Vomiting        Medication List     TAKE these medications    ALBUTEROL  IN Inhale 2 puffs into the lungs as needed.   atorvastatin  20 MG tablet Commonly known as: LIPITOR Take 20 mg by mouth daily.   buPROPion  300 MG 24 hr tablet Commonly known as: WELLBUTRIN  XL Take 300 mg by mouth daily.   carvedilol  12.5 MG tablet Commonly known as: COREG  Take 1 tablet (12.5 mg total) by mouth 2 (two) times daily with a meal.   FISH OIL PO Take 1 capsule by mouth 2 (two) times daily.   furosemide  20 MG tablet Commonly known as: Lasix  Take 1 tablet (20 mg total) by mouth daily as needed for fluid or edema. What changed:  when to take this reasons to take this   hydrOXYzine  10 MG tablet Commonly known as: ATARAX  Take 5 mg by mouth 2 (two) times daily as needed for anxiety.   isosorbide  mononitrate 30 MG 24 hr tablet Commonly known as: IMDUR  Take 1 tablet (30 mg total) by mouth daily. Start taking on: February 01, 2024   omeprazole 40 MG capsule Commonly known as: PRILOSEC Take 40 mg by mouth daily.   polyethylene glycol 17 g packet Commonly known as: MIRALAX  / GLYCOLAX  Take 17 g by mouth every other day.   potassium chloride  10 MEQ tablet Commonly known as: KLOR-CON  Take 1 tablet (10 mEq total) by  mouth daily as needed (only when taking furosemide  (lasix )). What changed:  when to take this reasons to take this   risperiDONE  1 MG tablet Commonly known as: RISPERDAL  Take 1 tablet (1 mg total) by mouth at bedtime.   Stiolto Respimat 2.5-2.5 MCG/ACT Aers Generic drug: Tiotropium Bromide-Olodaterol Inhale into the lungs daily.        Contact information for after-discharge care     Destination     [facility] .   Service: Skilled Nursing Contact information: [address] [place]  [address] [phone]                    Allergies  Allergen Reactions   Antihistamines, Diphenhydramine-Type Palpitations   Codeine Nausea And Vomiting   Allergies as of 01/31/2024       Reactions   Antihistamines, Diphenhydramine-type Palpitations   Codeine Nausea And Vomiting        Medication List     TAKE these medications    ALBUTEROL  IN Inhale 2 puffs into the lungs as needed.   atorvastatin  20 MG tablet Commonly known as: LIPITOR Take 20 mg by mouth daily.   buPROPion  300 MG  24 hr tablet Commonly known as: WELLBUTRIN  XL Take 300 mg by mouth daily.   carvedilol  12.5 MG tablet Commonly known as: COREG  Take 1 tablet (12.5 mg total) by mouth 2 (two) times daily with a meal.   FISH OIL PO Take 1 capsule by mouth 2 (two) times daily.   furosemide  20 MG tablet Commonly known as: Lasix  Take 1 tablet (20 mg total) by mouth daily as needed for fluid or edema. What changed:  when to take this reasons to take this   hydrOXYzine  10 MG tablet Commonly known as: ATARAX  Take 5 mg by mouth 2 (two) times daily as needed for anxiety.   isosorbide  mononitrate 30 MG 24 hr tablet Commonly known as: IMDUR  Take 1 tablet (30 mg total) by mouth daily. Start taking on: February 01, 2024   omeprazole 40 MG capsule Commonly known as: PRILOSEC Take 40 mg by mouth daily.   polyethylene glycol 17 g  packet Commonly known as: MIRALAX  / GLYCOLAX  Take 17 g by mouth every other day.   potassium chloride  10 MEQ tablet Commonly known as: KLOR-CON  Take 1 tablet (10 mEq total) by mouth daily as needed (only when taking furosemide  (lasix )). What changed:  when to take this reasons to take this   risperiDONE  1 MG tablet Commonly known as: RISPERDAL  Take 1 tablet (1 mg total) by mouth at bedtime.   Stiolto Respimat 2.5-2.5 MCG/ACT Aers Generic drug: Tiotropium Bromide-Olodaterol Inhale into the lungs daily.        Procedures/Studies: CT HEAD WO CONTRAST ( ) Result Date: 01/29/2024 CLINICAL DATA:  Initial evaluation for acute trauma, unwitnessed fall. EXAM: CT HEAD WITHOUT CONTRAST TECHNIQUE: Contiguous axial images were obtained from the base of the skull through the vertex without intravenous contrast. RADIATION DOSE REDUCTION: This exam was performed according to the departmental dose-optimization program which includes automated exposure control, adjustment of the mA and/or kV according to patient size and/or use of iterative reconstruction technique. COMPARISON:  CT from 01/26/2024. FINDINGS: Brain: Mild age-related cerebral atrophy. Patchy hypodensity involving the supratentorial cerebral white matter, consistent with chronic small vessel ischemic disease. No acute intracranial hemorrhage. No acute large vessel territory infarct. No mass lesion or midline shift. No hydrocephalus or extra-axial fluid collection. Vascular: No abnormal hyperdense vessel. Scattered vascular calcifications noted within the carotid siphons. Skull: Scalp soft tissues demonstrate no acute finding. Calvarium intact. Sinuses/Orbits: Globes orbital soft tissues within normal limits. Paranasal sinuses are largely clear. No mastoid effusion. Other: None. IMPRESSION: 1. No acute intracranial abnormality. 2. Mild age-related cerebral atrophy with chronic small vessel ischemic disease. Electronically Signed   By: Morene Hoard M.D.   On: 01/29/2024 00:04   CT ABDOMEN PELVIS W CONTRAST Result Date: 01/27/2024 CLINICAL DATA:  Fall. EXAM: CT ABDOMEN AND PELVIS WITH CONTRAST TECHNIQUE: Multidetector CT imaging of the abdomen and pelvis was performed using the standard protocol following bolus administration of intravenous contrast. RADIATION DOSE REDUCTION: This exam was performed according to the departmental dose-optimization program which includes automated exposure control, adjustment of the mA and/or kV according to patient size and/or use of iterative reconstruction technique. CONTRAST:  OMNIPAQUE  IOHEXOL  300 MG/ML  SOLN COMPARISON:  Pelvis today FINDINGS: Lower chest: No acute findings.  Moderate-sized hiatal hernia. Hepatobiliary: Multiple layering gallstones within the gallbladder. No focal hepatic abnormality or biliary ductal dilatation. Pancreas: No focal abnormality or ductal dilatation. Spleen: No focal abnormality.  Normal size. Adrenals/Urinary Tract: No adrenal abnormality. No focal renal abnormality. No stones or hydronephrosis. Urinary bladder is  unremarkable. Stomach/Bowel: Normal appendix. Sigmoid diverticulosis. There is a moderate-sized left inguinal hernia containing a loop of sigmoid colon which is the cause of the lucency seen in the inguinal region on prior pelvis x-ray. No bowel obstruction. Stomach and small bowel decompressed. Vascular/Lymphatic: Mild infrarenal abdominal aortic aneurysm, 3 cm. Diffuse aortic atherosclerosis. No adenopathy. Reproductive: No visible focal abnormality. Other: No free fluid or free air. Musculoskeletal: No acute bony abnormality. IMPRESSION: The lucency seen within the left inguinal region on prior pelvis study represents a left inguinal hernia containing sigmoid colon. No bowel obstruction. Sigmoid diverticulosis. Cholelithiasis. 3 cm infrarenal abdominal aortic aneurysm. Recommend follow-up ultrasound every 3 years. (Ref.: J Vasc Surg. 2018; 67:2-77 and J Am  Coll Radiol 2013;10(10):789-794.) Electronically Signed   By: Franky Crease M.D.   On: 01/27/2024 00:12   CT Thoracic Spine Wo Contrast Result Date: 01/26/2024 EXAM: CT THORACIC SPINE WITHOUT CONTRAST 01/26/2024 10:53:57 PM TECHNIQUE: CT of the thoracic spine was performed without the administration of intravenous contrast. Multiplanar reformatted images are provided for review. Automated exposure control, iterative reconstruction, and/or weight based adjustment of the mA/kV was utilized to reduce the radiation dose to as low as reasonably achievable. COMPARISON: None available. CLINICAL HISTORY: Back trauma, no prior imaging (Age >= 16y). Pt lives at home and states he tripped and fell. Neighbor came to check on him and didn't come to door, law enforcement went in to find pt on floo. FINDINGS: BONES AND ALIGNMENT: Normal vertebral body heights. No acute fracture or suspicious bone lesion. Normal alignment. DEGENERATIVE CHANGES: Mild degenerative changes of the mid thoracic spine. SOFT TISSUES: Moderate hiatal hernia. Mild thoracic aortic atherosclerosis. Moderate coronary atherosclerosis of the LAD. IMPRESSION: 1. No acute abnormality of the thoracic spine. Electronically signed by: Pinkie Pebbles MD 01/26/2024 11:09 PM EDT RP Workstation: [id number]   CT Lumbar Spine Wo Contrast Result Date: 01/26/2024 EXAM: CT OF THE LUMBAR SPINE WITHOUT CONTRAST 01/26/2024 10:53:57 PM TECHNIQUE: CT of the lumbar spine was performed without the administration of intravenous contrast. Multiplanar reformatted images are provided for review. Automated exposure control, iterative reconstruction, and/or weight based adjustment of the mA/kV was utilized to reduce the radiation dose to as low as reasonably achievable. COMPARISON: None available. CLINICAL HISTORY: Back trauma, no prior imaging (Age >= 16y). Pt lives at home and states he tripped and fell. Neighbor came to check on him and didn't come to door, law enforcement went  in to find pt on floo. FINDINGS: BONES AND ALIGNMENT: Normal vertebral body heights. No acute fracture or suspicious bone lesion. Normal alignment. DEGENERATIVE CHANGES: Mild degenerative changes of the upper / mid lumbar spine. SOFT TISSUES: 3.2 cm fusiform infrarenal abdominal aortic aneurysm (coronal image 16). Atherosclerotic calcification of the abdominal aorta and branch vessels. IMPRESSION: 1. No evidence of acute traumatic injury. 2. 3.2 cm fusiform infrarenal abdominal aortic aneurysm. Recommend surveillance ultrasound in 3 years. Electronically signed by: Pinkie Pebbles MD 01/26/2024 11:06 PM EDT RP Workstation: [id number]   CT Cervical Spine Wo Contrast Result Date: 01/26/2024 EXAM: CT CERVICAL SPINE WITHOUT CONTRAST 01/26/2024 10:53:57 PM TECHNIQUE: CT of the cervical spine was performed without the administration of intravenous contrast. Multiplanar reformatted images are provided for review. Automated exposure control, iterative reconstruction, and/or weight based adjustment of the mA/kV was utilized to reduce the radiation dose to as low as reasonably achievable. COMPARISON: None available. CLINICAL HISTORY: Neck trauma (Age >= 65y). Pt lives at home and states he tripped and fell. Neighbor came to check on him and didn't  come to door, law enforcement went in to find pt on floo. FINDINGS: CERVICAL SPINE: BONES AND ALIGNMENT: No acute fracture or traumatic malalignment. DEGENERATIVE CHANGES: Mild degenerative changes of the mid cervical spine. SOFT TISSUES: No prevertebral soft tissue swelling. IMPRESSION: 1. No acute abnormality of the cervical spine. Electronically signed by: Pinkie Pebbles MD 01/26/2024 11:01 PM EDT RP Workstation: [id number]   CT Head Wo Contrast Result Date: 01/26/2024 EXAM: CT HEAD WITHOUT CONTRAST 01/26/2024 10:53:57 PM TECHNIQUE: CT of the head was performed without the administration of intravenous contrast. Automated exposure control, iterative reconstruction,  and/or weight based adjustment of the mA/kV was utilized to reduce the radiation dose to as low as reasonably achievable. COMPARISON: 02/16/2022 CLINICAL HISTORY: Head trauma, minor (Age >= 65y). Pt lives at home and states he tripped and fell. Neighbor came to check on him and didn't come to door, law enforcement went in to find pt on floo. FINDINGS: BRAIN AND VENTRICLES: No acute hemorrhage. No evidence of acute infarct. No hydrocephalus. No extra-axial collection. No mass effect or midline shift. Mild intracranial atherosclerosis. Mild global cortical atrophy. Subcortical and periventricular small vessel ischemic changes. ORBITS: No acute abnormality. SINUSES: No acute abnormality. SOFT TISSUES AND SKULL: No acute soft tissue abnormality. No skull fracture. IMPRESSION: 1. No acute intracranial abnormality. Electronically signed by: Pinkie Pebbles MD 01/26/2024 11:00 PM EDT RP Workstation: [id number]   DG Pelvis Portable Result Date: 01/26/2024 EXAM: 1 or 2 VIEW(S) XRAY OF THE PELVIS 01/26/2024 10:13:00 PM COMPARISON: None available. CLINICAL HISTORY: fall. Elevated BP @ time of imaging (176/104), fell earlier this evening FINDINGS: BONES AND JOINTS: No displaced fracture identified. SOFT TISSUES: Mild subcutaneous gas within the left inguinal region overlying the inferior pubic ramus. IMPRESSION: 1. No displaced fracture identified. 2. Mild subcutaneous gas in the left inguinal region overlying the inferior pubic ramus. Electronically signed by: Dorethia Molt MD 01/26/2024 10:19 PM EDT RP Workstation: [id number]   DG Chest Portable 1 View Result Date: 01/26/2024 EXAM: 1 VIEW XRAY OF THE CHEST 01/26/2024 10:11:00 PM COMPARISON: 06/16/2023 CLINICAL HISTORY: fall. Elevated BP @ time of imaging (176/104), fell earlier this evening FINDINGS: LUNGS AND PLEURA: Emphysema. No focal pulmonary opacity. No pulmonary edema. No pleural effusion. No pneumothorax. HEART AND MEDIASTINUM: Small Hiatal hernia. No acute  abnormality of the cardiac and mediastinal silhouettes. BONES AND SOFT TISSUES: No acute osseous abnormality. IMPRESSION: 1. No acute process. 2. Emphysema 3. hiatal hernia. Electronically signed by: Dorethia Molt MD 01/26/2024 10:16 PM EDT RP Workstation: [id number]     Subjective: Pt says he feels well, no specific complaints.   Discharge Exam: Vitals:   01/30/24 2025 01/31/24 0701  BP: 124/77   Pulse: 72   Resp: 20   Temp: 98.1 F (36.7 C)   SpO2: 96% 97%   Vitals:   01/30/24 1031 01/30/24 1322 01/30/24 2025 01/31/24 0701  BP:  109/72 124/77   Pulse:  65 72   Resp:  20 20   Temp:  97.6 F (36.4 C) 98.1 F (36.7 C)   TempSrc:  Oral Oral   SpO2: 96% 96% 96% 97%  Height:        General exam: Appears calm and comfortable  Respiratory system: Clear to auscultation. Respiratory effort normal. Cardiovascular system: normal S1 & S2 heard. No JVD, murmurs, rubs, gallops or clicks. No pedal edema. Gastrointestinal system: Abdomen is nondistended, soft and nontender. No organomegaly or masses felt. Normal bowel sounds heard. Central nervous system: Alert and disoriented. No focal neurological deficits. Extremities: Symmetric  5 x 5 power. Skin: No rashes, lesions or ulcers. Psychiatry: Judgement and insight appear diminshed. Mood & affect normal.    The results of significant diagnostics from this hospitalization (including imaging, microbiology, ancillary and laboratory) are listed below for reference.     Microbiology: Recent Results (from the past 240 hours)  Resp panel by RT-PCR (RSV, Flu A&B, Covid) Anterior Nasal Swab     Status: None   Collection Time: 01/26/24  9:27 PM   Specimen: Anterior Nasal Swab  Result Value Ref Range Status   SARS Coronavirus 2 by RT PCR NEGATIVE NEGATIVE Final    Comment: (NOTE) SARS-CoV-2 target nucleic acids are NOT DETECTED.  The SARS-CoV-2 RNA is generally detectable in upper respiratory specimens during the acute phase of infection. The  lowest concentration of SARS-CoV-2 viral copies this assay can detect is 138 copies/mL. A negative result does not preclude SARS-Cov-2 infection and should not be used as the sole basis for treatment or other patient management decisions. A negative result may occur with  improper specimen collection/handling, submission of specimen other than nasopharyngeal swab, presence of viral mutation(s) within the areas targeted by this assay, and inadequate number of viral copies(<138 copies/mL). A negative result must be combined with clinical observations, patient history, and epidemiological information. The expected result is Negative.  Fact Sheet for Patients:  BloggerCourse.com  Fact Sheet for Healthcare Providers:  SeriousBroker.it  This test is no t yet approved or cleared by the [place]  FDA and  has been authorized for detection and/or diagnosis of SARS-CoV-2 by FDA under an Emergency Use Authorization (EUA). This EUA will remain  in effect (meaning this test can be used) for the duration of the COVID-19 declaration under Section 564(b)(1) of the Act, 21 U.S.C.section 360bbb-3(b)(1), unless the authorization is terminated  or revoked sooner.       Influenza A by PCR NEGATIVE NEGATIVE Final   Influenza B by PCR NEGATIVE NEGATIVE Final    Comment: (NOTE) The Xpert Xpress SARS-CoV-2/FLU/RSV plus assay is intended as an aid in the diagnosis of influenza from Nasopharyngeal swab specimens and should not be used as a sole basis for treatment. Nasal washings and aspirates are unacceptable for Xpert Xpress SARS-CoV-2/FLU/RSV testing.  Fact Sheet for Patients: BloggerCourse.com  Fact Sheet for Healthcare Providers: SeriousBroker.it  This test is not yet approved or cleared by the [place]  FDA and has been authorized for detection and/or diagnosis of SARS-CoV-2 by FDA under  an Emergency Use Authorization (EUA). This EUA will remain in effect (meaning this test can be used) for the duration of the COVID-19 declaration under Section 564(b)(1) of the Act, 21 U.S.C. section 360bbb-3(b)(1), unless the authorization is terminated or revoked.     Resp Syncytial Virus by PCR NEGATIVE NEGATIVE Final    Comment: (NOTE) Fact Sheet for Patients: BloggerCourse.com  Fact Sheet for Healthcare Providers: SeriousBroker.it  This test is not yet approved or cleared by the [place]  FDA and has been authorized for detection and/or diagnosis of SARS-CoV-2 by FDA under an Emergency Use Authorization (EUA). This EUA will remain in effect (meaning this test can be used) for the duration of the COVID-19 declaration under Section 564(b)(1) of the Act, 21 U.S.C. section 360bbb-3(b)(1), unless the authorization is terminated or revoked.  Performed at [facility], [address] [place], [place] [address]   Blood Culture (routine x 2)     Status: None   Collection Time: 01/26/24 10:55 PM   Specimen: BLOOD  Result Value Ref  Range Status   Specimen Description BLOOD BLOOD LEFT ARM  Final   Special Requests   Final    BOTTLES DRAWN AEROBIC AND ANAEROBIC Blood Culture adequate volume   Culture   Final    NO GROWTH 5 DAYS Performed at [facility], [address], [place], [place] [address]    Report Status 01/31/2024 FINAL  Final  Blood Culture (routine x 2)     Status: None   Collection Time: 01/26/24 10:58 PM   Specimen: BLOOD  Result Value Ref Range Status   Specimen Description BLOOD BLOOD LEFT HAND  Final   Special Requests   Final    BOTTLES DRAWN AEROBIC AND ANAEROBIC Blood Culture adequate volume   Culture   Final    NO GROWTH 5 DAYS Performed at [facility], [address], [place], [place] [address]    Report Status 01/31/2024 FINAL  Final  Respiratory (~20 pathogens) panel by PCR     Status: None    Collection Time: 01/27/24  2:52 AM   Specimen: Nasopharyngeal Swab; Respiratory  Result Value Ref Range Status   Adenovirus NOT DETECTED NOT DETECTED Final   Coronavirus 229E NOT DETECTED NOT DETECTED Final    Comment: (NOTE) The Coronavirus on the Respiratory Panel, DOES NOT test for the novel  Coronavirus (2019 nCoV)    Coronavirus HKU1 NOT DETECTED NOT DETECTED Final   Coronavirus NL63 NOT DETECTED NOT DETECTED Final   Coronavirus OC43 NOT DETECTED NOT DETECTED Final   Metapneumovirus NOT DETECTED NOT DETECTED Final   Rhinovirus / Enterovirus NOT DETECTED NOT DETECTED Final   Influenza A NOT DETECTED NOT DETECTED Final   Influenza B NOT DETECTED NOT DETECTED Final   Parainfluenza Virus 1 NOT DETECTED NOT DETECTED Final   Parainfluenza Virus 2 NOT DETECTED NOT DETECTED Final   Parainfluenza Virus 3 NOT DETECTED NOT DETECTED Final   Parainfluenza Virus 4 NOT DETECTED NOT DETECTED Final   Respiratory Syncytial Virus NOT DETECTED NOT DETECTED Final   Bordetella pertussis NOT DETECTED NOT DETECTED Final   Bordetella Parapertussis NOT DETECTED NOT DETECTED Final   Chlamydophila pneumoniae NOT DETECTED NOT DETECTED Final   Mycoplasma pneumoniae NOT DETECTED NOT DETECTED Final    Comment: Performed at [facility], [address], [place], [place] [address]     Labs: BNP (last 3 results) No results for input(s): BNP in the last 8760 hours. Basic Metabolic Panel: Recent Labs  Lab 01/26/24 2120 01/27/24 0335  NA 142 142  K 4.1 3.9  CL 105 106  CO2 25 24  GLUCOSE 99 114*  BUN 13 12  CREATININE 1.11 0.95  CALCIUM  9.4 9.2  MG  --  2.3  PHOS  --  2.8   Liver Function Tests: Recent Labs  Lab 01/26/24 2120  AST 28  ALT 22  ALKPHOS 82  BILITOT 1.3*  PROT 6.5  ALBUMIN 3.9   No results for input(s): LIPASE, AMYLASE in the last 168 hours. Recent Labs  Lab 01/27/24 0717  AMMONIA <13   CBC: Recent Labs  Lab 01/26/24 2120 01/27/24 0335 01/30/24 0433  WBC  15.7* 12.5* 8.3  NEUTROABS 12.9*  --  5.3  HGB 15.7 15.7 13.5  HCT 45.8 46.6 40.9  MCV 89.8 90.8 92.3  PLT 263 242 229   Cardiac Enzymes: Recent Labs  Lab 01/26/24 2127  CKTOTAL 271   BNP: Invalid input(s): POCBNP CBG: Recent Labs  Lab 01/26/24 2130 01/28/24 0922  GLUCAP 93 162*   D-Dimer  No results for input(s): DDIMER in the last 72 hours. Hgb A1c No results for input(s): HGBA1C in the last 72 hours. Lipid Profile No results for input(s): CHOL, HDL, LDLCALC, TRIG, CHOLHDL, LDLDIRECT in the last 72 hours. Thyroid function studies No results for input(s): TSH, T4TOTAL, T3FREE, THYROIDAB in the last 72 hours.  Invalid input(s): FREET3 Anemia work up Recent Labs    01/30/24 1418  VITAMINB12 263  FOLATE 12.3   Urinalysis    Component Value Date/Time   COLORURINE YELLOW 01/26/2024 2111   APPEARANCEUR CLEAR 01/26/2024 2111   LABSPEC 1.005 01/26/2024 2111   PHURINE 7.0 01/26/2024 2111   GLUCOSEU NEGATIVE 01/26/2024 2111   HGBUR NEGATIVE 01/26/2024 2111   BILIRUBINUR NEGATIVE 01/26/2024 2111   KETONESUR NEGATIVE 01/26/2024 2111   PROTEINUR NEGATIVE 01/26/2024 2111   NITRITE NEGATIVE 01/26/2024 2111   LEUKOCYTESUR NEGATIVE 01/26/2024 2111   Sepsis Labs Recent Labs  Lab 01/26/24 2120 01/27/24 0335 01/30/24 0433  WBC 15.7* 12.5* 8.3   Microbiology Recent Results (from the past 240 hours)  Resp panel by RT-PCR (RSV, Flu A&B, Covid) Anterior Nasal Swab     Status: None   Collection Time: 01/26/24  9:27 PM   Specimen: Anterior Nasal Swab  Result Value Ref Range Status   SARS Coronavirus 2 by RT PCR NEGATIVE NEGATIVE Final    Comment: (NOTE) SARS-CoV-2 target nucleic acids are NOT DETECTED.  The SARS-CoV-2 RNA is generally detectable in upper respiratory specimens during the acute phase of infection. The lowest concentration of SARS-CoV-2 viral copies this assay can detect is 138 copies/mL. A negative result does not preclude  SARS-Cov-2 infection and should not be used as the sole basis for treatment or other patient management decisions. A negative result may occur with  improper specimen collection/handling, submission of specimen other than nasopharyngeal swab, presence of viral mutation(s) within the areas targeted by this assay, and inadequate number of viral copies(<138 copies/mL). A negative result must be combined with clinical observations, patient history, and epidemiological information. The expected result is Negative.  Fact Sheet for Patients:  BloggerCourse.com  Fact Sheet for Healthcare Providers:  SeriousBroker.it  This test is no t yet approved or cleared by the [place]  FDA and  has been authorized for detection and/or diagnosis of SARS-CoV-2 by FDA under an Emergency Use Authorization (EUA). This EUA will remain  in effect (meaning this test can be used) for the duration of the COVID-19 declaration under Section 564(b)(1) of the Act, 21 U.S.C.section 360bbb-3(b)(1), unless the authorization is terminated  or revoked sooner.       Influenza A by PCR NEGATIVE NEGATIVE Final   Influenza B by PCR NEGATIVE NEGATIVE Final    Comment: (NOTE) The Xpert Xpress SARS-CoV-2/FLU/RSV plus assay is intended as an aid in the diagnosis of influenza from Nasopharyngeal swab specimens and should not be used as a sole basis for treatment. Nasal washings and aspirates are unacceptable for Xpert Xpress SARS-CoV-2/FLU/RSV testing.  Fact Sheet for Patients: BloggerCourse.com  Fact Sheet for Healthcare Providers: SeriousBroker.it  This test is not yet approved or cleared by the [place]  FDA and has been authorized for detection and/or diagnosis of SARS-CoV-2 by FDA under an Emergency Use Authorization (EUA). This EUA will remain in effect (meaning this test can be used) for the duration of  the COVID-19 declaration under Section 564(b)(1) of the Act, 21 U.S.C. section 360bbb-3(b)(1), unless the authorization is terminated or revoked.     Resp Syncytial Virus by PCR NEGATIVE NEGATIVE Final  Comment: (NOTE) Fact Sheet for Patients: BloggerCourse.com  Fact Sheet for Healthcare Providers: SeriousBroker.it  This test is not yet approved or cleared by the [place]  FDA and has been authorized for detection and/or diagnosis of SARS-CoV-2 by FDA under an Emergency Use Authorization (EUA). This EUA will remain in effect (meaning this test can be used) for the duration of the COVID-19 declaration under Section 564(b)(1) of the Act, 21 U.S.C. section 360bbb-3(b)(1), unless the authorization is terminated or revoked.  Performed at [facility], [address], [place], [place] [address]   Blood Culture (routine x 2)     Status: None   Collection Time: 01/26/24 10:55 PM   Specimen: BLOOD  Result Value Ref Range Status   Specimen Description BLOOD BLOOD LEFT ARM  Final   Special Requests   Final    BOTTLES DRAWN AEROBIC AND ANAEROBIC Blood Culture adequate volume   Culture   Final    NO GROWTH 5 DAYS Performed at [facility], [address], [place], [place] [address]    Report Status 01/31/2024 FINAL  Final  Blood Culture (routine x 2)     Status: None   Collection Time: 01/26/24 10:58 PM   Specimen: BLOOD  Result Value Ref Range Status   Specimen Description BLOOD BLOOD LEFT HAND  Final   Special Requests   Final    BOTTLES DRAWN AEROBIC AND ANAEROBIC Blood Culture adequate volume   Culture   Final    NO GROWTH 5 DAYS Performed at [facility], [address] [place], [place] [address]    Report Status 01/31/2024 FINAL  Final  Respiratory (~20 pathogens) panel by PCR     Status: None   Collection Time: 01/27/24  2:52 AM   Specimen: Nasopharyngeal Swab; Respiratory  Result Value Ref Range Status   Adenovirus  NOT DETECTED NOT DETECTED Final   Coronavirus 229E NOT DETECTED NOT DETECTED Final    Comment: (NOTE) The Coronavirus on the Respiratory Panel, DOES NOT test for the novel  Coronavirus (2019 nCoV)    Coronavirus HKU1 NOT DETECTED NOT DETECTED Final   Coronavirus NL63 NOT DETECTED NOT DETECTED Final   Coronavirus OC43 NOT DETECTED NOT DETECTED Final   Metapneumovirus NOT DETECTED NOT DETECTED Final   Rhinovirus / Enterovirus NOT DETECTED NOT DETECTED Final   Influenza A NOT DETECTED NOT DETECTED Final   Influenza B NOT DETECTED NOT DETECTED Final   Parainfluenza Virus 1 NOT DETECTED NOT DETECTED Final   Parainfluenza Virus 2 NOT DETECTED NOT DETECTED Final   Parainfluenza Virus 3 NOT DETECTED NOT DETECTED Final   Parainfluenza Virus 4 NOT DETECTED NOT DETECTED Final   Respiratory Syncytial Virus NOT DETECTED NOT DETECTED Final   Bordetella pertussis NOT DETECTED NOT DETECTED Final   Bordetella Parapertussis NOT DETECTED NOT DETECTED Final   Chlamydophila pneumoniae NOT DETECTED NOT DETECTED Final   Mycoplasma pneumoniae NOT DETECTED NOT DETECTED Final    Comment: Performed at [facility], [address], [place], [place] [address]   Time coordinating discharge: 33 mins  SIGNED:  Afton Louder, MD  [facility] 01/31/2024, 11:18 AM How to contact the [facility] Attending or Consulting provider 7A - 7P or covering provider during after hours 7P -7A, for this patient?  Check the care team in [facility] and look for a) attending/consulting [facility] provider listed and b) the [facility] team listed Log into [email or web address] and use [place]'s universal password to access. If you do not have the password, please contact the  hospital operator. Locate the [facility] provider you are looking for under [facility] and page to a number that you can be directly reached. If you still have difficulty reaching the provider, please page the [facility] (Director on Call) for the Hospitalists listed on amion for  assistance.

## 2024-01-31 NOTE — Discharge Instructions (Signed)
IMPORTANT INFORMATION: PAY CLOSE ATTENTION   PHYSICIAN DISCHARGE INSTRUCTIONS  Follow with Primary care provider  [facility]  and other consultants as instructed by your Hospitalist Physician  SEEK MEDICAL CARE OR RETURN TO EMERGENCY ROOM IF SYMPTOMS COME BACK, WORSEN OR NEW PROBLEM DEVELOPS   Please note: You were cared for by a hospitalist during your hospital stay. Every effort will be made to forward records to your primary care provider.  You can request that your primary care provider send for your hospital records if they have not received them.  Once you are discharged, your primary care physician will handle any further medical issues. Please note that NO REFILLS for any discharge medications will be authorized once you are discharged, as it is imperative that you return to your primary care physician (or establish a relationship with a primary care physician if you do not have one) for your post hospital discharge needs so that they can reassess your need for medications and monitor your lab values.  Please get a complete blood count and chemistry panel checked by your Primary MD at your next visit, and again as instructed by your Primary MD.  Get Medicines reviewed and adjusted: Please take all your medications with you for your next visit with your Primary MD  Laboratory/radiological data: Please request your Primary MD to go over all hospital tests and procedure/radiological results at the follow up, please ask your primary care provider to get all Hospital records sent to his/her office.  In some cases, they will be blood work, cultures and biopsy results pending at the time of your discharge. Please request that your primary care provider follow up on these results.  If you are diabetic, please bring your blood sugar readings with you to your follow up appointment with primary care.    Please call and make your follow up appointments as soon as possible.    Also  Note the following: If you experience worsening of your admission symptoms, develop shortness of breath, life threatening emergency, suicidal or homicidal thoughts you must seek medical attention immediately by calling 911 or calling your MD immediately  if symptoms less severe.  You must read complete instructions/literature along with all the possible adverse reactions/side effects for all the Medicines you take and that have been prescribed to you. Take any new Medicines after you have completely understood and accpet all the possible adverse reactions/side effects.   Do not drive when taking Pain medications or sleeping medications (Benzodiazepines)  Do not take more than prescribed Pain, Sleep and Anxiety Medications. It is not advisable to combine anxiety,sleep and pain medications without talking with your primary care practitioner  Special Instructions: If you have smoked or chewed Tobacco  in the last 2 yrs please stop smoking, stop any regular Alcohol  and or any Recreational drug use.  Wear Seat belts while driving.  Do not drive if taking any narcotic, mind altering or controlled substances or recreational drugs or alcohol.       

## 2024-01-31 NOTE — Plan of Care (Signed)
  Problem: Education: Goal: Knowledge of General Education information will improve Description: Including pain rating scale, medication(s)/side effects and non-pharmacologic comfort measures Outcome: Adequate for Discharge   Problem: Health Behavior/Discharge Planning: Goal: Ability to manage health-related needs will improve Outcome: Adequate for Discharge   Problem: Clinical Measurements: Goal: Ability to maintain clinical measurements within normal limits will improve Outcome: Adequate for Discharge Goal: Will remain free from infection Outcome: Adequate for Discharge Goal: Diagnostic test results will improve Outcome: Adequate for Discharge Goal: Respiratory complications will improve Outcome: Adequate for Discharge Goal: Cardiovascular complication will be avoided Outcome: Adequate for Discharge   Problem: Activity: Goal: Risk for activity intolerance will decrease Outcome: Adequate for Discharge   Problem: Nutrition: Goal: Adequate nutrition will be maintained Outcome: Adequate for Discharge   Problem: Coping: Goal: Level of anxiety will decrease Outcome: Adequate for Discharge   Problem: Elimination: Goal: Will not experience complications related to bowel motility Outcome: Adequate for Discharge Goal: Will not experience complications related to urinary retention Outcome: Adequate for Discharge   Problem: Pain Managment: Goal: General experience of comfort will improve and/or be controlled Outcome: Adequate for Discharge   Problem: Safety: Goal: Ability to remain free from injury will improve Outcome: Adequate for Discharge   Problem: Skin Integrity: Goal: Risk for impaired skin integrity will decrease Outcome: Adequate for Discharge   Problem: Acute Rehab OT Goals (only OT should resolve) Goal: Pt. Will Perform Grooming Outcome: Adequate for Discharge Goal: Pt. Will Perform Lower Body Bathing Outcome: Adequate for Discharge Goal: Pt. Will Perform  Lower Body Dressing Outcome: Adequate for Discharge Goal: Pt. Will Transfer To Toilet Outcome: Adequate for Discharge Goal: Pt. Will Perform Toileting-Clothing Manipulation Outcome: Adequate for Discharge Goal: Pt/Caregiver Will Perform Home Exercise Program Outcome: Adequate for Discharge   Problem: Acute Rehab PT Goals(only PT should resolve) Goal: Pt Will Go Supine/Side To Sit Outcome: Adequate for Discharge Goal: Patient Will Transfer Sit To/From Stand Outcome: Adequate for Discharge Goal: Pt Will Transfer Bed To Chair/Chair To Bed Outcome: Adequate for Discharge Goal: Pt Will Ambulate Outcome: Adequate for Discharge
